# Patient Record
Sex: Female | Born: 1965 | Race: White | Hispanic: No | Smoking: Current every day smoker
Health system: Southern US, Community
[De-identification: ages and names within clinical notes are randomized; demographics above are authoritative.]

## PROBLEM LIST (undated history)

## (undated) DIAGNOSIS — K219 Gastro-esophageal reflux disease without esophagitis: Secondary | ICD-10-CM

## (undated) DIAGNOSIS — F32A Depression, unspecified: Secondary | ICD-10-CM

## (undated) DIAGNOSIS — D751 Secondary polycythemia: Secondary | ICD-10-CM

## (undated) DIAGNOSIS — E119 Type 2 diabetes mellitus without complications: Secondary | ICD-10-CM

## (undated) DIAGNOSIS — I10 Essential (primary) hypertension: Secondary | ICD-10-CM

## (undated) DIAGNOSIS — K760 Fatty (change of) liver, not elsewhere classified: Secondary | ICD-10-CM

## (undated) DIAGNOSIS — D759 Disease of blood and blood-forming organs, unspecified: Secondary | ICD-10-CM

## (undated) DIAGNOSIS — J449 Chronic obstructive pulmonary disease, unspecified: Secondary | ICD-10-CM

## (undated) DIAGNOSIS — M1990 Unspecified osteoarthritis, unspecified site: Secondary | ICD-10-CM

## (undated) DIAGNOSIS — R011 Cardiac murmur, unspecified: Secondary | ICD-10-CM

## (undated) DIAGNOSIS — F329 Major depressive disorder, single episode, unspecified: Secondary | ICD-10-CM

## (undated) DIAGNOSIS — M170 Bilateral primary osteoarthritis of knee: Secondary | ICD-10-CM

## (undated) DIAGNOSIS — K589 Irritable bowel syndrome without diarrhea: Secondary | ICD-10-CM

## (undated) HISTORY — PX: DIAGNOSTIC LAPAROSCOPY: SUR761

## (undated) HISTORY — DX: Unspecified osteoarthritis, unspecified site: M19.90

## (undated) HISTORY — DX: Bilateral primary osteoarthritis of knee: M17.0

## (undated) HISTORY — DX: Fatty (change of) liver, not elsewhere classified: K76.0

## (undated) HISTORY — PX: KNEE SURGERY: SHX244

## (undated) HISTORY — DX: Type 2 diabetes mellitus without complications: E11.9

## (undated) HISTORY — PX: ABDOMINAL HYSTERECTOMY: SHX81

---

## 2012-06-24 ENCOUNTER — Ambulatory Visit: Payer: Self-pay | Admitting: Internal Medicine

## 2016-06-10 DIAGNOSIS — E119 Type 2 diabetes mellitus without complications: Secondary | ICD-10-CM

## 2016-06-10 HISTORY — DX: Type 2 diabetes mellitus without complications: E11.9

## 2016-06-21 DIAGNOSIS — M170 Bilateral primary osteoarthritis of knee: Secondary | ICD-10-CM | POA: Insufficient documentation

## 2016-06-21 DIAGNOSIS — E1159 Type 2 diabetes mellitus with other circulatory complications: Secondary | ICD-10-CM | POA: Insufficient documentation

## 2016-06-21 DIAGNOSIS — R030 Elevated blood-pressure reading, without diagnosis of hypertension: Secondary | ICD-10-CM | POA: Insufficient documentation

## 2016-06-21 DIAGNOSIS — Z6841 Body Mass Index (BMI) 40.0 and over, adult: Secondary | ICD-10-CM | POA: Insufficient documentation

## 2016-06-24 ENCOUNTER — Other Ambulatory Visit: Payer: Self-pay | Admitting: Internal Medicine

## 2016-06-24 DIAGNOSIS — Z1239 Encounter for other screening for malignant neoplasm of breast: Secondary | ICD-10-CM

## 2016-08-06 ENCOUNTER — Ambulatory Visit: Payer: Self-pay | Attending: Internal Medicine

## 2016-12-06 DIAGNOSIS — N3001 Acute cystitis with hematuria: Secondary | ICD-10-CM | POA: Diagnosis not present

## 2016-12-06 DIAGNOSIS — R300 Dysuria: Secondary | ICD-10-CM | POA: Diagnosis not present

## 2016-12-11 ENCOUNTER — Emergency Department: Payer: BLUE CROSS/BLUE SHIELD

## 2016-12-11 ENCOUNTER — Emergency Department
Admission: EM | Admit: 2016-12-11 | Discharge: 2016-12-12 | Disposition: A | Discharge status: Home / Self Care | Payer: BLUE CROSS/BLUE SHIELD | Attending: Emergency Medicine | Admitting: Emergency Medicine

## 2016-12-11 DIAGNOSIS — E1165 Type 2 diabetes mellitus with hyperglycemia: Secondary | ICD-10-CM

## 2016-12-11 DIAGNOSIS — N938 Other specified abnormal uterine and vaginal bleeding: Secondary | ICD-10-CM | POA: Diagnosis not present

## 2016-12-11 DIAGNOSIS — N939 Abnormal uterine and vaginal bleeding, unspecified: Secondary | ICD-10-CM

## 2016-12-11 DIAGNOSIS — R739 Hyperglycemia, unspecified: Secondary | ICD-10-CM | POA: Diagnosis not present

## 2016-12-11 DIAGNOSIS — R7989 Other specified abnormal findings of blood chemistry: Secondary | ICD-10-CM

## 2016-12-11 DIAGNOSIS — R748 Abnormal levels of other serum enzymes: Secondary | ICD-10-CM | POA: Insufficient documentation

## 2016-12-11 DIAGNOSIS — Z794 Long term (current) use of insulin: Secondary | ICD-10-CM | POA: Diagnosis not present

## 2016-12-11 DIAGNOSIS — R945 Abnormal results of liver function studies: Secondary | ICD-10-CM | POA: Diagnosis not present

## 2016-12-11 DIAGNOSIS — F17200 Nicotine dependence, unspecified, uncomplicated: Secondary | ICD-10-CM | POA: Insufficient documentation

## 2016-12-11 DIAGNOSIS — K8020 Calculus of gallbladder without cholecystitis without obstruction: Secondary | ICD-10-CM | POA: Diagnosis not present

## 2016-12-11 LAB — CBC WITH DIFFERENTIAL/PLATELET
Basophils Absolute: 0.1 10*3/uL (ref 0–0.1)
Basophils Relative: 1 %
Eosinophils Absolute: 0.1 10*3/uL (ref 0–0.7)
Eosinophils Relative: 1 %
HCT: 51 % — ABNORMAL HIGH (ref 35.0–47.0)
Hemoglobin: 17.4 g/dL — ABNORMAL HIGH (ref 12.0–16.0)
Lymphocytes Relative: 36 %
Lymphs Abs: 3.5 10*3/uL (ref 1.0–3.6)
MCH: 29.9 pg (ref 26.0–34.0)
MCHC: 34.2 g/dL (ref 32.0–36.0)
MCV: 87.4 fL (ref 80.0–100.0)
Monocytes Absolute: 0.4 10*3/uL (ref 0.2–0.9)
Monocytes Relative: 4 %
Neutro Abs: 5.7 10*3/uL (ref 1.4–6.5)
Neutrophils Relative %: 58 %
Platelets: 255 10*3/uL (ref 150–440)
RBC: 5.84 MIL/uL — ABNORMAL HIGH (ref 3.80–5.20)
RDW: 13.8 % (ref 11.5–14.5)
WBC: 9.8 10*3/uL (ref 3.6–11.0)

## 2016-12-11 LAB — GLUCOSE, CAPILLARY: GLUCOSE-CAPILLARY: 320 mg/dL — AB (ref 65–99)

## 2016-12-11 LAB — COMPREHENSIVE METABOLIC PANEL
ALT: 244 U/L — ABNORMAL HIGH (ref 14–54)
AST: 283 U/L — ABNORMAL HIGH (ref 15–41)
Albumin: 3.9 g/dL (ref 3.5–5.0)
Alkaline Phosphatase: 143 U/L — ABNORMAL HIGH (ref 38–126)
Anion gap: 9 (ref 5–15)
BUN: 6 mg/dL (ref 6–20)
CO2: 26 mmol/L (ref 22–32)
Calcium: 9.2 mg/dL (ref 8.9–10.3)
Chloride: 98 mmol/L — ABNORMAL LOW (ref 101–111)
Creatinine, Ser: 0.64 mg/dL (ref 0.44–1.00)
GFR calc Af Amer: >60 mL/min (ref >60)
GFR calc non Af Amer: >60 mL/min (ref >60)
Glucose, Bld: 470 mg/dL — ABNORMAL HIGH (ref 65–99)
Potassium: 3.7 mmol/L (ref 3.5–5.1)
Sodium: 133 mmol/L — ABNORMAL LOW (ref 135–145)
Total Bilirubin: 0.8 mg/dL (ref 0.3–1.2)
Total Protein: 8 g/dL (ref 6.5–8.1)

## 2016-12-11 LAB — URINALYSIS, COMPLETE (UACMP) WITH MICROSCOPIC
BACTERIA UA: NONE SEEN
BILIRUBIN URINE: NEGATIVE
Ketones, ur: NEGATIVE mg/dL
LEUKOCYTES UA: NEGATIVE
NITRITE: NEGATIVE
PROTEIN: NEGATIVE mg/dL
SPECIFIC GRAVITY, URINE: 1.028 (ref 1.005–1.030)
WBC UA: NONE SEEN WBC/hpf (ref 0–5)
pH: 6 (ref 5.0–8.0)

## 2016-12-11 LAB — POCT PREGNANCY, URINE: Preg Test, Ur: NEGATIVE

## 2016-12-11 MED ORDER — SODIUM CHLORIDE 0.9 % IV BOLUS (SEPSIS)
1000.0000 mL | Freq: Once | INTRAVENOUS | Status: AC
  Administered 2016-12-11: 1000 mL via INTRAVENOUS

## 2016-12-11 MED ORDER — IOPAMIDOL (ISOVUE-300) INJECTION 61%
30.0000 mL | Freq: Once | INTRAVENOUS | Status: AC | PRN
  Administered 2016-12-11: 30 mL via ORAL
  Filled 2016-12-11: qty 30

## 2016-12-11 MED ORDER — METFORMIN HCL 500 MG PO TABS
500.0000 mg | ORAL_TABLET | Freq: Once | ORAL | Status: AC
  Administered 2016-12-11: 500 mg via ORAL
  Filled 2016-12-11: qty 1

## 2016-12-11 NOTE — ED Provider Notes (Signed)
[facility] Emergency Department Provider Note  ____________________________________________  Time seen: Approximately 7:24 PM  I have reviewed the triage vital signs and the nursing notes.   HISTORY  Chief Complaint Vaginal Discharge    HPI Kristen Wong is a 51 y.o. female presenting to the emergency department with vaginal bleeding, vaginal pruritus and dysuria for the past 6 days. Patient has not been menstruating for 3-4 years. Patient states that she presented to a local urgent care who diagnosed her with acute cystitis. Patient states that results from her urinalysis were not discussed with her but that her urine was sent for culture and she was treated with antibiotics. Patient is unsure of what antibiotics she was taking. Patient states that dysuria did not improve. She has been afebrile. She denies history of nephrolithiasis or pyelonephritis. No CVA tenderness. She has been afebrile. She states that she has had an approximately 20 pound weight loss and has experienced intermittent nausea and vomiting over the past three weeks. She denies night sweats or bony pain. She denies changes in vaginal discharge. Patient is not currently sexually active and hasn't been for the past 8 years. Patient states that approximately 20 years ago she engaged in the recreational use of cocaine but did not engage in IV drug use.  Patient has not been under the care of a primary care provider in 20 years. No alleviating measures have been attempted.    History reviewed. No pertinent past medical history.  There are no active problems to display for this patient.   History reviewed. No pertinent surgical history.  Prior to Admission medications   Not on File    Allergies Patient has no known allergies.  No family history on file.  Social History Social History  Substance Use Topics  . Smoking status: Current Every Day Smoker  . Smokeless tobacco: Never Used  .  Alcohol use No     Review of Systems  Constitutional: No fever/chills Eyes: No visual changes. No discharge ENT: No upper respiratory complaints. Cardiovascular: no chest pain. Respiratory: no cough. No SOB. Gastrointestinal: No abdominal pain.  No nausea, no vomiting.  No diarrhea.  No constipation. Genitourinary: Patient has dysuria, malodorous vaginal discharge and vaginal pruritis Musculoskeletal: Negative for musculoskeletal pain. Skin: Negative for rash, abrasions, lacerations, ecchymosis. Neurological: Negative for headaches, focal weakness or numbness.   ____________________________________________   PHYSICAL EXAM:  VITAL SIGNS: ED Triage Vitals [12/11/16 1827]  Enc Vitals Group     BP      Pulse      Resp      Temp      Temp src      SpO2      Weight 236 lb (107 kg)     Height 5' 4"  (1.626 m)     Head Circumference      Peak Flow      Pain Score 8     Pain Loc      Pain Edu?      Excl. in [place]?      Constitutional: Alert and oriented. Well appearing and in no acute distress. Eyes: Conjunctivae are normal. PERRL. EOMI. Head: Atraumatic. Cardiovascular: Normal rate, regular rhythm. Normal S1 and S2.  Good peripheral circulation. Respiratory: Normal respiratory effort without tachypnea or retractions. Lungs CTAB. Good air entry to the bases with no decreased or absent breath sounds. Gastrointestinal: Bowel sounds 4 quadrants. Soft and nontender to palpation. No guarding or rigidity. No palpable masses. No distention. No  CVA tenderness. Genitourinary: Vaginal atrophy visualized with excoriation and vaginal dryness. No blood was visualized in the vaginal vault. Musculoskeletal: Full range of motion to all extremities. No gross deformities appreciated. Neurologic:  Normal speech and language. No gross focal neurologic deficits are appreciated.  Skin:  Skin is warm, dry and intact. No rash noted. Psychiatric: Mood and affect are normal. Speech and behavior are  normal. Patient exhibits appropriate insight and judgement.   ____________________________________________   LABS (all labs ordered are listed, but only abnormal results are displayed)  Labs Reviewed  URINALYSIS, COMPLETE (UACMP) WITH MICROSCOPIC - Abnormal; Notable for the following:       Result Value   Color, Urine STRAW (*)    APPearance CLEAR (*)    Glucose, UA >=500 (*)    Hgb urine dipstick MODERATE (*)    Squamous Epithelial / LPF 0-5 (*)    All other components within normal limits  CBC WITH DIFFERENTIAL/PLATELET - Abnormal; Notable for the following:    RBC 5.84 (*)    Hemoglobin 17.4 (*)    HCT 51.0 (*)    All other components within normal limits  COMPREHENSIVE METABOLIC PANEL - Abnormal; Notable for the following:    Sodium 133 (*)    Chloride 98 (*)    Glucose, Bld 470 (*)    AST 283 (*)    ALT 244 (*)    Alkaline Phosphatase 143 (*)    All other components within normal limits  GLUCOSE, CAPILLARY - Abnormal; Notable for the following:    Glucose-Capillary 320 (*)    All other components within normal limits  WET PREP, GENITAL  POC URINE PREG, ED  POCT PREGNANCY, URINE  CBG MONITORING, ED   ____________________________________________  EKG   ____________________________________________  RADIOLOGY Unk Pinto, personally viewed and evaluated these images as part of my medical decision making, as well as reviewing the written report by the radiologist.    US Transvaginal Non-ob  Result Date: 12/11/2016 CLINICAL DATA:  Postmenopausal vaginal bleeding for 3 weeks EXAM: TRANSABDOMINAL AND TRANSVAGINAL ULTRASOUND OF PELVIS TECHNIQUE: Both transabdominal and transvaginal ultrasound examinations of the pelvis were performed. Transabdominal technique was performed for global imaging of the pelvis including uterus, ovaries, adnexal regions, and pelvic cul-de-sac. It was necessary to proceed with endovaginal exam following the transabdominal exam to  visualize the endometrium. COMPARISON:  None FINDINGS: Uterus Measurements: 5 x 2 x 3 cm. Sub endometrial cystic structure implying adenomyosis. Endometrium Thickness: 6 mm.  No focal abnormality visualized. Right ovary Could not be visualized. Left ovary Could not be visualized Other findings No abnormal free fluid. Visualization is limited by patient size and discomfort with transvaginal exam. The uterus is also deviated. IMPRESSION: 1. 6 mm endometrial stripe. In the setting of post-menopausal bleeding, referral for endometrial sampling is indicated to exclude carcinoma. (Ref: Radiological Reasoning: Algorithmic Workup of Abnormal Vaginal Bleeding with Endovaginal Sonography and Sonohysterography. AJR 2008; 631:S97-02) 2. Sub endometrial cyst implying adenomyosis. 3. Limited study due to patient factors. The ovaries could not be visualized. Electronically Signed   By: Monte Fantasia M.D.   On: 12/11/2016 20:41   US Pelvis Complete  Result Date: 12/11/2016 CLINICAL DATA:  Postmenopausal vaginal bleeding for 3 weeks EXAM: TRANSABDOMINAL AND TRANSVAGINAL ULTRASOUND OF PELVIS TECHNIQUE: Both transabdominal and transvaginal ultrasound examinations of the pelvis were performed. Transabdominal technique was performed for global imaging of the pelvis including uterus, ovaries, adnexal regions, and pelvic cul-de-sac. It was necessary to proceed with endovaginal exam following  the transabdominal exam to visualize the endometrium. COMPARISON:  None FINDINGS: Uterus Measurements: 5 x 2 x 3 cm. Sub endometrial cystic structure implying adenomyosis. Endometrium Thickness: 6 mm.  No focal abnormality visualized. Right ovary Could not be visualized. Left ovary Could not be visualized Other findings No abnormal free fluid. Visualization is limited by patient size and discomfort with transvaginal exam. The uterus is also deviated. IMPRESSION: 1. 6 mm endometrial stripe. In the setting of post-menopausal bleeding, referral for  endometrial sampling is indicated to exclude carcinoma. (Ref: Radiological Reasoning: Algorithmic Workup of Abnormal Vaginal Bleeding with Endovaginal Sonography and Sonohysterography. AJR 2008; 321:Y24-82) 2. Sub endometrial cyst implying adenomyosis. 3. Limited study due to patient factors. The ovaries could not be visualized. Electronically Signed   By: Monte Fantasia M.D.   On: 12/11/2016 20:41    ____________________________________________    PROCEDURES  Procedure(s) performed:    Procedures    Medications  sodium chloride 0.9 % bolus 1,000 mL (0 mLs Intravenous Stopped 12/11/16 2301)  iopamidol (ISOVUE-300) 61 % injection 30 mL (30 mLs Oral Contrast Given 12/11/16 2130)  metFORMIN (GLUCOPHAGE) tablet 500 mg (500 mg Oral Given 12/11/16 2145)     ____________________________________________   INITIAL IMPRESSION / ASSESSMENT AND PLAN / ED COURSE  Pertinent labs & imaging results that were available during my care of the patient were reviewed by me and considered in my medical decision making (see chart for details).  Review of the [place] CSRS was performed in accordance of the [place] prior to dispensing any controlled drugs.    Assessment and Plan:  Vaginal Bleeding Hyperglycemia Elevated liver enzymes Patient presented to the emergency department with vaginal bleeding, vaginal pruritus and dysuria. Pelvic ultrasound conducted in the emergency department revealed findings consistent with adenomyosis. Patient was advised to follow up with her OB/GYN to have endometrial biopsy. Patient voiced understanding regarding this recommendation. Workup conducted during this emergency department encounter revealed hyperglycemia and elevated liver enzymes. Patient's case was discussed with my supervising physician, Dr. Delman Kitten. Given recent weight loss and elevated liver enzymes, CT abdomen and pelvis were ordered in concern for malignancy. Supplemental fluids and metformin were given in the  emergency department. Patient was transferred to main side of emergency department as [facility] closed for the day.All patient questions were answered prior to transfer to main side.    ____________________________________________  FINAL CLINICAL IMPRESSION(S) / ED DIAGNOSES  Final diagnoses:  Vaginal bleeding      NEW MEDICATIONS STARTED DURING THIS VISIT:  New Prescriptions   No medications on file        This chart was dictated using voice recognition software/Dragon. Despite best efforts to proofread, errors can occur which can change the meaning. Any change was purely unintentional.    Lannie Fields, PA-C 12/11/16 2321    Delman Kitten, MD 12/12/16 [phone]

## 2016-12-11 NOTE — ED Notes (Signed)
Pt presents to ED with c/o vaginal discharge. Pt states that she was seen and given prescription for abx on Friday with no relief. Pt c/o burning, itching, odor, vaginal discharge, and intermittent bleeding x 3 weeks. Pt states has been using 7 day monistat treatment with some relief. Pt is alert and oriented at this time.

## 2016-12-11 NOTE — ED Notes (Signed)
MD and PA at bedside.

## 2016-12-11 NOTE — ED Provider Notes (Signed)
Medical screening examination/treatment/procedure(s) were conducted as a shared visit with non-physician practitioner(s) and myself.  I personally evaluated the patient during the encounter.  ----------------------------------------- 9:24 PM on 12/11/2016 -----------------------------------------    Vitals:   12/11/16 2347  BP: (!) 166/79  Pulse: 81  Resp: 18  Temp: 98.1 F (36.7 C)     Ct Abdomen Pelvis Wo Contrast  Result Date: 12/11/2016 CLINICAL DATA:  Dysarthria, weight loss and elevated LFTs EXAM: CT ABDOMEN AND PELVIS WITHOUT CONTRAST TECHNIQUE: Multidetector CT imaging of the abdomen and pelvis was performed following the standard protocol without IV contrast. COMPARISON:  Pelvic ultrasound 12/11/2016 FINDINGS: Lower chest: No pulmonary nodules. No visible pleural or pericardial effusion. Hepatobiliary: Attenuation of the liver measuring less than 40 HU is consistent with hepatic steatosis. There is focal fatty sparing of the gallbladder fossa. There is a large stone within the gallbladder. No evidence of inflammation. Pancreas: Normal pancreatic contours. No peripancreatic fluid collection or pancreatic ductal dilatation. Spleen: Normal. Adrenals/Urinary Tract: Normal adrenal glands. No hydronephrosis or nephrolithiasis. No abnormal perinephric stranding. No ureteral obstruction. Stomach/Bowel: There is no hiatal hernia. The stomach and duodenum are normal. There is no dilated small bowel or enteric inflammation. There is no colonic abnormality. The appendix is normal. Vascular/Lymphatic: There is atherosclerotic calcification of the non aneurysmal abdominal aorta. No abdominal or pelvic adenopathy. Reproductive: There is fluid in the endometrial cavity, better characterized on concomitant pelvic ultrasound. Musculoskeletal: No lytic or blastic osseous lesion. Normal visualized extrathoracic and extraperitoneal soft tissues. Other: No contributory non-categorized findings. IMPRESSION: 1.  No acute abnormality of the abdomen or pelvis. 2. Hepatic steatosis and cholelithiasis without acute inflammation. 3.  Aortic Atherosclerosis (ICD10-I70.0). Electronically Signed   By: Ulyses Jarred M.D.   On: 12/11/2016 23:49   US Transvaginal Non-ob  Result Date: 12/11/2016 CLINICAL DATA:  Postmenopausal vaginal bleeding for 3 weeks EXAM: TRANSABDOMINAL AND TRANSVAGINAL ULTRASOUND OF PELVIS TECHNIQUE: Both transabdominal and transvaginal ultrasound examinations of the pelvis were performed. Transabdominal technique was performed for global imaging of the pelvis including uterus, ovaries, adnexal regions, and pelvic cul-de-sac. It was necessary to proceed with endovaginal exam following the transabdominal exam to visualize the endometrium. COMPARISON:  None FINDINGS: Uterus Measurements: 5 x 2 x 3 cm. Sub endometrial cystic structure implying adenomyosis. Endometrium Thickness: 6 mm.  No focal abnormality visualized. Right ovary Could not be visualized. Left ovary Could not be visualized Other findings No abnormal free fluid. Visualization is limited by patient size and discomfort with transvaginal exam. The uterus is also deviated. IMPRESSION: 1. 6 mm endometrial stripe. In the setting of post-menopausal bleeding, referral for endometrial sampling is indicated to exclude carcinoma. (Ref: Radiological Reasoning: Algorithmic Workup of Abnormal Vaginal Bleeding with Endovaginal Sonography and Sonohysterography. AJR 2008; 833:A25-05) 2. Sub endometrial cyst implying adenomyosis. 3. Limited study due to patient factors. The ovaries could not be visualized. Electronically Signed   By: Monte Fantasia M.D.   On: 12/11/2016 20:41   US Pelvis Complete  Result Date: 12/11/2016 CLINICAL DATA:  Postmenopausal vaginal bleeding for 3 weeks EXAM: TRANSABDOMINAL AND TRANSVAGINAL ULTRASOUND OF PELVIS TECHNIQUE: Both transabdominal and transvaginal ultrasound examinations of the pelvis were performed. Transabdominal technique  was performed for global imaging of the pelvis including uterus, ovaries, adnexal regions, and pelvic cul-de-sac. It was necessary to proceed with endovaginal exam following the transabdominal exam to visualize the endometrium. COMPARISON:  None FINDINGS: Uterus Measurements: 5 x 2 x 3 cm. Sub endometrial cystic structure implying adenomyosis. Endometrium Thickness: 6 mm.  No  focal abnormality visualized. Right ovary Could not be visualized. Left ovary Could not be visualized Other findings No abnormal free fluid. Visualization is limited by patient size and discomfort with transvaginal exam. The uterus is also deviated. IMPRESSION: 1. 6 mm endometrial stripe. In the setting of post-menopausal bleeding, referral for endometrial sampling is indicated to exclude carcinoma. (Ref: Radiological Reasoning: Algorithmic Workup of Abnormal Vaginal Bleeding with Endovaginal Sonography and Sonohysterography. AJR 2008; 550:I16-42) 2. Sub endometrial cyst implying adenomyosis. 3. Limited study due to patient factors. The ovaries could not be visualized. Electronically Signed   By: Monte Fantasia M.D.   On: 12/11/2016 20:41   Carefully discussed with the patient her need for close follow-up and careful return precautions. I will start her on metformin as I suspect she has type 2 diabetes, likely hyperglycemia has been giving her some feeling of dysuria. She will be following up with a primary doctor, following up with gastroenterology, and also with gynecology.  Return precautions and treatment recommendations and follow-up discussed with the patient who is agreeable with the plan.    Delman Kitten, MD 12/12/16 [phone]

## 2016-12-11 NOTE — ED Triage Notes (Signed)
Pt c/o vaginal discharge with pain and itching for the past 3 weeks, states [facility] was seen [facility] and put on abx for uti. States she is not getting any better.

## 2016-12-12 MED ORDER — METFORMIN HCL 500 MG PO TABS: 500.0000 mg | ORAL_TABLET | Freq: Two times a day (BID) | ORAL | 0 refills | Status: DC

## 2016-12-12 NOTE — Discharge Instructions (Signed)
It is very important that he set up close follow-up with her doctor. Please start the medication we have given you for diabetes.  Follow-up with a primary care physician as soon as possible as well as a gynecologist. Need further evaluation with both and this is VERY important.  It is important that you follow up closely with your primary care doctor in the next couple of days.  You have a follow-up as well with gastroenterology for further tests as your liver tests and enzymes were abnormal. Please setup this appointment as soon as possible.  Please return to the emergency room right away if you are to develop a fever, yellowing of the skin or eyes, severe nausea, your pain becomes severe or worsens, you are unable to keep food down, begin vomiting any dark or bloody fluid, you develop any dark or bloody stools, feel dehydrated, or other new concerns or symptoms arise.

## 2016-12-12 NOTE — ED Notes (Signed)
NAD noted at time of D/C. Pt denies questions or concerns. Pt ambulatory to the lobby at this time.  

## 2016-12-12 NOTE — ED Notes (Signed)
This RN to bedside at this time. Pt states that she is ready to go home. Dr. Jacqualine Code notified, states he is going to talk to patient and that she is able to be D/C.

## 2016-12-17 DIAGNOSIS — N61 Mastitis without abscess: Secondary | ICD-10-CM | POA: Diagnosis not present

## 2016-12-17 DIAGNOSIS — D751 Secondary polycythemia: Secondary | ICD-10-CM | POA: Diagnosis not present

## 2016-12-17 DIAGNOSIS — E119 Type 2 diabetes mellitus without complications: Secondary | ICD-10-CM | POA: Diagnosis not present

## 2016-12-17 DIAGNOSIS — R748 Abnormal levels of other serum enzymes: Secondary | ICD-10-CM | POA: Diagnosis not present

## 2016-12-20 DIAGNOSIS — E118 Type 2 diabetes mellitus with unspecified complications: Secondary | ICD-10-CM | POA: Insufficient documentation

## 2016-12-20 DIAGNOSIS — R809 Proteinuria, unspecified: Secondary | ICD-10-CM | POA: Insufficient documentation

## 2016-12-20 DIAGNOSIS — E119 Type 2 diabetes mellitus without complications: Secondary | ICD-10-CM | POA: Insufficient documentation

## 2016-12-22 DIAGNOSIS — D751 Secondary polycythemia: Secondary | ICD-10-CM | POA: Insufficient documentation

## 2016-12-22 DIAGNOSIS — N61 Mastitis without abscess: Secondary | ICD-10-CM | POA: Insufficient documentation

## 2016-12-24 ENCOUNTER — Encounter: Payer: Self-pay | Admitting: Obstetrics and Gynecology

## 2016-12-25 ENCOUNTER — Ambulatory Visit (INDEPENDENT_AMBULATORY_CARE_PROVIDER_SITE_OTHER): Payer: BLUE CROSS/BLUE SHIELD | Admitting: Obstetrics and Gynecology

## 2016-12-25 ENCOUNTER — Encounter: Payer: Self-pay | Admitting: Obstetrics and Gynecology

## 2016-12-25 VITALS — BP 142/78 | HR 98 | Ht 64.0 in | Wt 229.0 lb

## 2016-12-25 DIAGNOSIS — N950 Postmenopausal bleeding: Secondary | ICD-10-CM

## 2016-12-25 DIAGNOSIS — Z124 Encounter for screening for malignant neoplasm of cervix: Secondary | ICD-10-CM

## 2016-12-25 DIAGNOSIS — B379 Candidiasis, unspecified: Secondary | ICD-10-CM | POA: Diagnosis not present

## 2016-12-25 DIAGNOSIS — C541 Malignant neoplasm of endometrium: Secondary | ICD-10-CM | POA: Diagnosis not present

## 2016-12-25 DIAGNOSIS — R935 Abnormal findings on diagnostic imaging of other abdominal regions, including retroperitoneum: Secondary | ICD-10-CM

## 2016-12-25 MED ORDER — NYSTATIN 100000 UNIT/GM EX CREA: 1.0000 "application " | TOPICAL_CREAM | Freq: Two times a day (BID) | CUTANEOUS | 1 refills | Status: DC

## 2016-12-25 MED ORDER — TERCONAZOLE 0.8 % VA CREA: 1.0000 | TOPICAL_CREAM | Freq: Every day | VAGINAL | 1 refills | Status: DC

## 2016-12-25 NOTE — Patient Instructions (Signed)
Postmenopausal Bleeding Postmenopausal bleeding is any bleeding after menopause. Menopause is when a woman's period stops. Any type of bleeding after menopause is concerning. It should be checked by your doctor. Any treatment will depend on the cause. Follow these instructions at home: Watch your condition for any changes.  Avoid the use of tampons and douches as told by your doctor.  Change your pads often.  Get regular pelvic exams and Pap tests.  Keep all appointments for tests as told by your doctor.  Contact a doctor if:  Your bleeding lasts for more than 1 week.  You have belly (abdominal) pain.  You have bleeding after sex (intercourse). Get help right away if:  You have a fever, chills, a headache, dizziness, muscle aches, and bleeding.  You have strong pain with bleeding.  You have clumps of blood (blood clots) coming from your vagina.  You have bleeding and need more than 1 pad an hour.  You feel like you are going to pass out (faint). This information is not intended to replace advice given to you by your health care provider. Make sure you discuss any questions you have with your health care provider. Document Released: 03/05/2008 Document Revised: 11/02/2015 Document Reviewed: 12/24/2012 Elsevier Interactive Patient Education  2017 [employer]  

## 2016-12-25 NOTE — Progress Notes (Signed)
Gynecology H&P  Chief Complaint:  Chief Complaint  Patient presents with  . ER follow up    vaginal dryness/itching/burning    History of Present Illness: Patient is a 51 y.o. G2P1011 presents evaluation of postmenopausal bleeding. The patient states she has had 1 episodes of bleeding in the past 2 week(s).  The most recent episode occurred  2  week(s) ago.  The bleeding has been intermittent but consisted of multiple episodes and has not contained clots . She describes the blood as Bright red in appearance.  She has had cramping. She deniestrauma or other inciting event, early satiety and abdominal pain. She does not have a history of abnormal pap smears.  Her last pap smear was>20 years ago years ago and was read as no abnormalities .  The patient is currently sexually active with no postcoital bleeding noted There are no other aggravating factors reported. There are no alleviating factors reported. The patient's past medical history is noncontributory, she has not been under regular medical care for sometime.   She hashad prior work up for postmenopausal bleeding.  ER ultrasound 12/11/16 normal, endometrial stripe thickened at 31m  Review of Systems: 10 point review of systems negative unless otherwise noted in HPI  Past Medical History:  Past Medical History:  Diagnosis Date  . Diabetes mellitus without complication ([place] [id number]  Type 2    Past Surgical History:  Past Surgical History:  Procedure Laterality Date  . CESAREAN SECTION    . KNEE SURGERY     [facility]    Family History:  History reviewed. No pertinent family history.  Social History:  Social History   Social History  . Marital status: Divorced    Spouse name: N/A  . Number of children: N/A  . Years of education: N/A   Occupational History  . Not on file.   Social History Main Topics  . Smoking status: Current Every Day Smoker  . Smokeless tobacco: Never Used  . Alcohol use No  . Drug use: No  .  Sexual activity: Not Currently   Other Topics Concern  . Not on file   Social History Narrative  . No narrative on file    Allergies:  Allergies  Allergen Reactions  . Penicillin G Nausea Only    Medications: Prior to Admission medications   Medication Sig Start Date End Date Taking? Authorizing Provider  Blood Glucose Monitoring Suppl (FIFTY50 GLUCOSE METER 2.0) w/Device KIT Use as directed. 12/17/16 12/17/17 Yes [provider]  doxycycline (MONODOX) 100 MG capsule Take by mouth. 12/17/16 03/17/17 Yes [provider]  fluticasone (FLONASE) 50 MCG/ACT nasal spray Place into the nose. 04/12/16  Yes [provider]  glimepiride (AMARYL) 2 MG tablet Take by mouth. 12/22/16 12/22/17 Yes [provider]  Insulin Pen Needle (FIFTY50 PEN NEEDLES) 32G X 6 MM MISC Use as directed. 12/20/16 12/20/17 Yes [provider]  metFORMIN (GLUCOPHAGE) 500 MG tablet Take 1 tablet (500 mg total) by mouth 2 (two) times daily with a meal. 12/12/16  Yes QDelman Kitten MD    Physical Exam Vitals: Blood pressure (!) 142/78, pulse 98, height 5' 4"  (1.626 m), weight 229 lb (103.9 kg).  General: NAD HEENT: normocephalic, anicteric Pulmonary: No increased work of breathing Abdomen: NABS, soft, non-tender, non-distended.  Umbilicus without lesions.  No hepatomegaly, splenomegaly or masses palpable. No evidence of hernia  Genitourinary:  External: Normal external female genitalia.  Normal urethral meatus, normal Bartholin's and Skene's  glands.  Some erythema external labia consistent with candida  Vagina: Normal vaginal mucosa, no evidence of prolapse.    Cervix: Grossly normal in appearance, no bleeding  Uterus: Non-enlarged, mobile, normal contour.  No CMT  Adnexa: ovaries non-enlarged, no adnexal masses  Rectal: deferred Extremities: no edema, erythema, or tenderness Neurologic: Grossly intact Psychiatric: mood appropriate, affect full  ENDOMETRIAL BIOPSY     The  indications for endometrial biopsy were reviewed.   Risks of the biopsy including cramping, bleeding, infection, uterine perforation, inadequate specimen and need for additional procedures  were discussed. The patient states she understands and agrees to undergo procedure today. Consent was signed. Time out was performed. Urine HCG was negative. A Graves speculum was placed and the cervix was brought into view.  The cervix was prepped with Betadine. A single-toothed tenaculum was  placed on the anterior lip of the cervix for traction. A 3 mm pipelle was introduced through the cervix into the endometrial cavity without difficulty to a depth of 7cm, and a moderate amount of tissue was obtained, the resulting specime sent to pathology. The instruments were removed from the patient's vagina. Minimal bleeding from the cervix was noted. The patient tolerated the procedure well. Routine post-procedure instructions were given to the patient.  She will be contacted by phone one results become available.     Assessment: 51 y.o. G2P1011 ER follow up postmenopausal bleeding  Plan: Problem List Items Addressed This Visit    None    Visit Diagnoses    Cervical cancer screening    -  Primary   Relevant Orders   PapIG, HPV, rfx 16/18   Postmenopausal bleeding       Relevant Orders   PapIG, HPV, rfx 16/18   Pathology Report   Abnormal ultrasound of endometrium       Relevant Orders   Pathology Report   Candida infection       Relevant Medications   nystatin cream (MYCOSTATIN)      1) We discussed that menopause is a clinical diagnosis made after 12 months of amenorrhea.  The average age of menopause in the  [place] population is 69 but there may be significant variation.  Any bleeding that happens after a 12 month period of amenorrhea warrants further work.  Possible etiologies of postmenopausal bleeding were discussed with the patient today.  These may range from benign etiologies such as urethral  prolapse and atrophy, to indeterminate lesions such as submucosal fibroids or polyps which would require resection to accurately evaluate. The role of unopposed estrogen in the development of  dndometrial hyperplasia or carcinoma is discussed.  The risk of endometrial hyperplasia is linearly correlated with increasing BMI given the production of estrone by adipose tissue.  Work up will be include transvaginal ultrasound to assess the thickness of the endometrial lining as well as to assess for focal uterine lesions.  Negative ultrasound evaluation, defined as the absence of focal lesions and endometrial stripe of <10m, effectively rules out carcinoma and confirms atrophy as the most likely etiology.  Should focal lesions be present these generally require hysteroscopic resection.  Should lining be greater >439mendometrial biopsy is warranted to rule out hyperplasia or frank endometrial cancer.  Continued episodes of bleeding despite negative ultrasound also warrant endometrial sampling.  As the cervical pathology may also be implicated in postmenopausal bleeding prior cervical cytology was reviewed and repeated if required per ASCCP guidelines.   2) Candida - rx nystatin and terazole.  Discussed role  of blood sugar control in pathogenesis or recurrent yeast infections  3) Evaluation by pelvic ultrasound done in ED, EMB discussed performed today. Pros and cons of these modalities of testing discussed.   4) A total of 20 minutes were spent in face-to-face contact with the patient during this encounter with over half of that time devoted to counseling and coordination of care.

## 2016-12-27 LAB — PATHOLOGY

## 2016-12-31 ENCOUNTER — Encounter: Payer: Self-pay | Admitting: *Deleted

## 2016-12-31 ENCOUNTER — Encounter: Payer: BLUE CROSS/BLUE SHIELD | Attending: Internal Medicine | Admitting: *Deleted

## 2016-12-31 VITALS — BP 142/84 | Ht 64.0 in | Wt 226.5 lb

## 2016-12-31 DIAGNOSIS — Z713 Dietary counseling and surveillance: Secondary | ICD-10-CM | POA: Diagnosis not present

## 2016-12-31 DIAGNOSIS — E119 Type 2 diabetes mellitus without complications: Secondary | ICD-10-CM | POA: Diagnosis not present

## 2016-12-31 LAB — PAPIG, HPV, RFX 16/18
HPV, HIGH-RISK: NEGATIVE
PAP Smear Comment: 0

## 2016-12-31 NOTE — Progress Notes (Signed)
Diabetes Self-Management Education  Visit Type: First/Initial  Appt. Start Time: 1510 Appt. End Time: 1620  12/31/2016  Kristen Wong, identified by name and date of birth, is a 51 y.o. female with a diagnosis of Diabetes: Type 2.   ASSESSMENT  Blood pressure (!) 142/84, height 5' 4"  (1.626 m), weight 226 lb 8 oz (102.7 kg). Body mass index is 38.88 kg/m.      Diabetes Self-Management Education - 12/31/16 1643      Visit Information   Visit Type First/Initial     Initial Visit   Diabetes Type Type 2   Are you currently following a meal plan? Yes   What type of meal plan do you follow? "less junk food"   Are you taking your medications as prescribed? Yes   Date Diagnosed 2 weeks ago     Health Coping   How would you rate your overall health? Poor     Psychosocial Assessment   Patient Belief/Attitude about Diabetes Other (comment)  "not very well"   Self-care barriers None   Self-management support Doctor's office;Family   Patient Concerns Nutrition/Meal planning;Glycemic Control;Medication;Monitoring;Weight Control;Healthy Lifestyle   Special Needs None   Preferred Learning Style Auditory;Visual;Hands on   [place] in progress   How often do you need to have someone help you when you read instructions, pamphlets, or other written materials from your doctor or pharmacy? 1 - Never   What is the last grade level you completed in school? 12th     Pre-Education Assessment   Patient understands the diabetes disease and treatment process. Needs Instruction   Patient understands incorporating nutritional management into lifestyle. Needs Instruction   Patient undertands incorporating physical activity into lifestyle. Needs Instruction   Patient understands using medications safely. Needs Instruction   Patient understands monitoring blood glucose, interpreting and using results Needs Review   Patient understands prevention, detection, and treatment of acute  complications. Needs Instruction   Patient understands prevention, detection, and treatment of chronic complications. Needs Instruction   Patient understands how to develop strategies to address psychosocial issues. Needs Instruction   Patient understands how to develop strategies to promote health/change behavior. Needs Instruction     Complications   Last HgB A1C per patient/outside source 12.7 %  12/17/16   How often do you check your blood sugar? 1-2 times/day   Fasting Blood glucose range (mg/dL) 70-129;130-179  FBG's 116, 148, 156 mg/dL   Postprandial Blood glucose range (mg/dL) 130-179;180-200;>200  pp's 180, 130, 225 mg/dL.    Number of hypoglycemic episodes per month 1  She reported symptoms but didn't have meter.    Can you tell when your blood sugar is low? Yes   What do you do if your blood sugar is low? ate some crackers   Have you had a dilated eye exam in the past 12 months? No   Have you had a dental exam in the past 12 months? No   Are you checking your feet? No     Dietary Intake   Breakfast sausage and egg sandwich; oatmeal   Lunch Atkins frozen dinner and yogurt or fruit   Dinner baked chicken or pork chops or hamburger with corn, potatoes, green beans, pinto beans, broccoli   Beverage(s) water, coffee or tea sweetened with Splenda     Exercise   Exercise Type ADL's     Patient Education   Previous Diabetes Education No   Disease state  Definition of diabetes, type 1 and 2,  and the diagnosis of diabetes   Nutrition management  Role of diet in the treatment of diabetes and the relationship between the three main macronutrients and blood glucose level;Reviewed blood glucose goals for pre and post meals and how to evaluate the patients' food intake on their blood glucose level.   Physical activity and exercise  Role of exercise on diabetes management, blood pressure control and cardiac health.   Medications Taught/reviewed insulin injection, site rotation, insulin  storage and needle disposal.;Reviewed patients medication for diabetes, action, purpose, timing of dose and side effects.   Monitoring Purpose and frequency of SMBG.;Taught/discussed recording of test results and interpretation of SMBG.;Yearly dilated eye exam   Acute complications Taught treatment of hypoglycemia - the 15 rule.   Chronic complications Relationship between chronic complications and blood glucose control   Psychosocial adjustment Identified and addressed patients feelings and concerns about diabetes     Individualized Goals (developed by patient)   Reducing Risk Improve blood sugars Decrease medications Prevent diabetes complications Lose weight Lead a healthier lifestyle     Outcomes   Expected Outcomes Demonstrated interest in learning. Expect positive outcomes      Individualized Plan for Diabetes Self-Management Training:   Learning Objective:  Patient will have a greater understanding of diabetes self-management. Patient education plan is to attend individual and/or group sessions per assessed needs and concerns.   Plan:   Patient Instructions  Check blood sugars 2 x day before breakfast and 2 hrs after supper every day Bring blood sugar records to the next class Exercise: Begin walking for  10-15  minutes 3 days a week and gradually increase to 30 minutes 5 x week Eat 3 meals day, 1-2 snacks a day Space meals 4-6 hours apart Avoid sugar sweetened drinks (juices) unless treating a low blood sugar Quit  smoking Make an eye doctor appointment Carry fast acting glucose and a snack at all times Rotate injection sites Hold insulin pen in place 5-10 seconds after injection   Expected Outcomes:  Demonstrated interest in learning. Expect positive outcomes  Education material provided:  General Meal Planning Guidelines Simple Meal Plan Glucose tablets Symptoms, causes and treatments of Hypoglycemia Getting Started - Insulin (BD) Lancet device  If problems  or questions, patient to contact team via:  Johny Drilling, [place], [place], CDE [phone]  Future DSME appointment:  Patient to check her work calendar and call back to schedule classes.

## 2016-12-31 NOTE — Patient Instructions (Signed)
Check blood sugars 2 x day before breakfast and 2 hrs after supper every day Bring blood sugar records to the next class  Exercise: Begin walking for  10-15  minutes 3 days a week and gradually increase to 30 minutes 5 x week  Eat 3 meals day, 1-2 snacks a day Space meals 4-6 hours apart Avoid sugar sweetened drinks (juices) unless treating a low blood sugar  Quit  smoking  Make an eye doctor appointment  Carry fast acting glucose and a snack at all times Rotate injection sites Hold insulin pen in place 5-10 seconds after injection  Return for classes on:

## 2017-01-03 ENCOUNTER — Telehealth: Payer: Self-pay | Admitting: Obstetrics and Gynecology

## 2017-01-03 NOTE — Telephone Encounter (Signed)
Hyperplasia with atypia, attempted to contact no answer.  Voicemail left to call back to discuss next steps

## 2017-01-06 ENCOUNTER — Telehealth: Payer: Self-pay | Admitting: Oncology

## 2017-01-06 ENCOUNTER — Inpatient Hospital Stay: Payer: BLUE CROSS/BLUE SHIELD | Admitting: Oncology

## 2017-01-06 ENCOUNTER — Inpatient Hospital Stay: Payer: BLUE CROSS/BLUE SHIELD

## 2017-01-06 NOTE — Telephone Encounter (Signed)
Please advise 

## 2017-01-06 NOTE — Telephone Encounter (Signed)
Left message on patient's v/m to reschedule missed appointment.

## 2017-01-06 NOTE — Telephone Encounter (Signed)
Pt is calling to for result. Please advise [phone]

## 2017-01-06 NOTE — Telephone Encounter (Signed)
Pt is calling do to missed call. Pt would like a call back . Work Number [phone]

## 2017-01-07 ENCOUNTER — Other Ambulatory Visit: Payer: Self-pay | Admitting: Obstetrics and Gynecology

## 2017-01-07 DIAGNOSIS — N8502 Endometrial intraepithelial neoplasia [EIN]: Secondary | ICD-10-CM

## 2017-01-07 NOTE — Progress Notes (Signed)
Discussed findings of complex hyperplasia with atypia, risk for underlying endometrial cancer and rational for proceeding with hysterectomy.  Will have patient meet with GYN-[facility] and then co-ordinate surgery

## 2017-01-09 DIAGNOSIS — R7989 Other specified abnormal findings of blood chemistry: Secondary | ICD-10-CM | POA: Diagnosis not present

## 2017-01-09 DIAGNOSIS — Z1211 Encounter for screening for malignant neoplasm of colon: Secondary | ICD-10-CM | POA: Diagnosis not present

## 2017-01-15 ENCOUNTER — Inpatient Hospital Stay: Payer: BLUE CROSS/BLUE SHIELD | Admitting: Oncology

## 2017-01-15 ENCOUNTER — Other Ambulatory Visit: Payer: Self-pay

## 2017-01-15 ENCOUNTER — Inpatient Hospital Stay: Payer: BLUE CROSS/BLUE SHIELD

## 2017-01-15 ENCOUNTER — Encounter: Payer: Self-pay | Admitting: Oncology

## 2017-01-15 ENCOUNTER — Inpatient Hospital Stay: Payer: BLUE CROSS/BLUE SHIELD | Attending: Obstetrics and Gynecology | Admitting: Obstetrics and Gynecology

## 2017-01-15 VITALS — BP 134/70 | HR 86 | Temp 97.1°F | Resp 18 | Ht 64.0 in | Wt 223.1 lb

## 2017-01-15 VITALS — BP 134/70 | HR 86 | Temp 97.1°F | Ht 64.0 in | Wt 223.0 lb

## 2017-01-15 DIAGNOSIS — D751 Secondary polycythemia: Secondary | ICD-10-CM | POA: Diagnosis not present

## 2017-01-15 DIAGNOSIS — Z79899 Other long term (current) drug therapy: Secondary | ICD-10-CM | POA: Diagnosis not present

## 2017-01-15 DIAGNOSIS — T50905A Adverse effect of unspecified drugs, medicaments and biological substances, initial encounter: Secondary | ICD-10-CM

## 2017-01-15 DIAGNOSIS — D582 Other hemoglobinopathies: Secondary | ICD-10-CM | POA: Insufficient documentation

## 2017-01-15 DIAGNOSIS — E669 Obesity, unspecified: Secondary | ICD-10-CM | POA: Diagnosis not present

## 2017-01-15 DIAGNOSIS — Z794 Long term (current) use of insulin: Secondary | ICD-10-CM | POA: Diagnosis not present

## 2017-01-15 DIAGNOSIS — Z129 Encounter for screening for malignant neoplasm, site unspecified: Secondary | ICD-10-CM

## 2017-01-15 DIAGNOSIS — R634 Abnormal weight loss: Secondary | ICD-10-CM | POA: Insufficient documentation

## 2017-01-15 DIAGNOSIS — K8020 Calculus of gallbladder without cholecystitis without obstruction: Secondary | ICD-10-CM | POA: Diagnosis not present

## 2017-01-15 DIAGNOSIS — N8501 Benign endometrial hyperplasia: Secondary | ICD-10-CM | POA: Insufficient documentation

## 2017-01-15 DIAGNOSIS — Z6838 Body mass index (BMI) 38.0-38.9, adult: Secondary | ICD-10-CM | POA: Diagnosis not present

## 2017-01-15 DIAGNOSIS — K760 Fatty (change of) liver, not elsewhere classified: Secondary | ICD-10-CM | POA: Insufficient documentation

## 2017-01-15 DIAGNOSIS — Z7982 Long term (current) use of aspirin: Secondary | ICD-10-CM | POA: Diagnosis not present

## 2017-01-15 DIAGNOSIS — E8881 Metabolic syndrome: Secondary | ICD-10-CM | POA: Insufficient documentation

## 2017-01-15 DIAGNOSIS — E119 Type 2 diabetes mellitus without complications: Secondary | ICD-10-CM | POA: Insufficient documentation

## 2017-01-15 DIAGNOSIS — I700 Atherosclerosis of aorta: Secondary | ICD-10-CM | POA: Diagnosis not present

## 2017-01-15 DIAGNOSIS — Z716 Tobacco abuse counseling: Secondary | ICD-10-CM | POA: Insufficient documentation

## 2017-01-15 DIAGNOSIS — IMO0001 Reserved for inherently not codable concepts without codable children: Secondary | ICD-10-CM

## 2017-01-15 DIAGNOSIS — F17210 Nicotine dependence, cigarettes, uncomplicated: Secondary | ICD-10-CM | POA: Diagnosis not present

## 2017-01-15 LAB — CBC WITH DIFFERENTIAL/PLATELET
Basophils Absolute: 0.1 10*3/uL (ref 0–0.1)
Basophils Relative: 1 %
Eosinophils Absolute: 0.1 10*3/uL (ref 0–0.7)
Eosinophils Relative: 1 %
HEMATOCRIT: 47.4 % — AB (ref 35.0–47.0)
HEMOGLOBIN: 16.6 g/dL — AB (ref 12.0–16.0)
LYMPHS ABS: 3.8 10*3/uL — AB (ref 1.0–3.6)
LYMPHS PCT: 37 %
MCH: 30.3 pg (ref 26.0–34.0)
MCHC: 34.9 g/dL (ref 32.0–36.0)
MCV: 86.8 fL (ref 80.0–100.0)
MONOS PCT: 6 %
Monocytes Absolute: 0.6 10*3/uL (ref 0.2–0.9)
NEUTROS ABS: 5.8 10*3/uL (ref 1.4–6.5)
NEUTROS PCT: 55 %
Platelets: 329 10*3/uL (ref 150–440)
RBC: 5.46 MIL/uL — ABNORMAL HIGH (ref 3.80–5.20)
RDW: 14.1 % (ref 11.5–14.5)
WBC: 10.4 10*3/uL (ref 3.6–11.0)

## 2017-01-15 LAB — PROTIME-INR
INR: 1.01
Prothrombin Time: 13.3 seconds (ref 11.4–15.2)

## 2017-01-15 LAB — PATHOLOGIST SMEAR REVIEW

## 2017-01-15 NOTE — H&P (Signed)
Gynecologic Oncology Consult Visit   Referring Provider: Dr. Malachy Mood  Chief Concern: complex atypical hyperplasia  Subjective:  Kristen Wong is a 51 y.o. G1P1 female who is seen in consultation from Dr. 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 She presented to Dr. Georgianne Fick for evaluation of perimenopausal bleeding. She has had hot flashes for years and abnormal bleeding. She has not had a normal period since her 52's. Evaluation included the following:  Pelvic ultrasound 12/11/16 norma except for endometrial stripe thickened at 87m. Uterus Measurements: 5 x 2 x 3 cm. Sub endometrial cystic structure implying adenomyosis. Bilateral ovaries could not be visualized. No abnormal free fluid.  CT scan A/P: negative for adenopathy or metastatic disease. She hepatic steatosis and cholelithiasis without acute inflammation and aortic atherosclerosis based on calcifications.   Consult with Dr. SGeorgianne Fick7/18/2018. He noted she does not have a history of abnormal pap smears.  Her prior last pap smear was>20 years ago years ago and was read as no abnormalities.  Pap  which was NILM. Endometrial biopsy uterus sounded to 7 cm and pathology "COMPLEX HYPERPLASIA WITH ATYPIA CAN NOT EXCLUDE WELL DIFFERENTIATED ENDOMETRIAL ADENOCARCINOMA."   She presents today for evaluation. She also has an appointment with Dr. YTasia Catchingsfor evaluation of polycythemia with a Hb 17.4 and HCT 51.0. She also has elevated LFTs in the 200's and saw Dr. ETiffany Kocheron 01/09/2017. She had a complete evaluation including the following:  - Comprehensive Metabolic Panel (CMP) - repeat AST 63 and ALT 93.  - Prothrombin Time (INR) - Iron Panel - Ferritin - HBV/HCV (Profile VIII) - [facility] - Hep A Ab, Total - [facility] - AFP, Serum, Tumor Marker - [facility] - Alpha-1-Antitrypsin Phenotyp - [facility] - Mitochondrial (M2) Antibody - [facility] - Antinuclear Antibodies, IFA - [facility] - Actin (Smooth Muscle) Antibody -  [facility] - Ceruloplasmin - [facility]  Workup negative except for positive Hep A total Ab positive. The overall findings were consistent with an acute event (i.e. Viral infection, medication/supplement induced, etc) that created the acutely elevated LFTs. She likely has a degree of fatty liver per CT findings, and lifestyle modifications were recommended as well as Hepatitis B vaccination as she is not immune.         Problem List: Patient Active Problem List   Diagnosis Date Noted  . Endometrial hyperplasia without atypia, complex 01/15/2017  . Ele[place]ated hemoglobin [place] 01/15/2017    Past Medical History: Past Medical History:  Diagnosis Date  . Arthritis   . [place] complication ([place] [id number]  Type 2    Past Surgical History: Past Surgical History:  Procedure Laterality Date  . CESAREAN SECTION    . KNEE SURGERY     [facility]    Past Gynecologic History:  Menarche: 10 Menstrual details: long history of abnormal vaginal bleeding and cycles.  Menses regular: n/a Last Menstrual Period: unknown History of Abnormal pap: no, benign cellular changes Last pap: see HPI    OB History:  OB History  Gravida Para Term Preterm AB Living  2 1 1   1 1   SAB TAB Ectopic Multiple Live Births          1    # Outcome Date GA Lbr Len/2nd Weight Sex Delivery Anes PTL Lv  2 Term 01/04/89    F CS-LTranv  N LIV  1 AB               Family History: Family History  Problem Relation  Age of Onset  . Diabetes Mother   . Diabetes Father   . Diabetes Sister     Social History: Social History   Social History  . Marital status: Divorced    Spouse name: N/A  . Number of children: N/A  . Years of education: N/A   Occupational History  . Not on file.   Social History Main Topics  . Smoking status: Current Every Day Smoker    Packs/day: 1.00    Years: 30.00  . Smokeless tobacco: Never Used  . Alcohol use No  . Drug use: No  . Sexual activity: Not Currently    Other Topics Concern  . Not on file   Social History Narrative  . No narrative on file    Allergies: Allergies  Allergen Reactions  . Penicillin G Nausea Only    Current Medications: Current Outpatient Prescriptions  Medication Sig Dispense Refill  . Blood Glucose Monitoring Suppl (FIFTY50 GLUCOSE METER 2.0) w/Device KIT Use as directed.    . insulin glargine (LANTUS) 100 unit/mL SOPN Inject 20 Units into the skin at bedtime.    . Insulin Pen Needle (FIFTY50 PEN NEEDLES) 32G X 6 MM MISC Use as directed.    . metFORMIN (GLUCOPHAGE) 500 MG tablet Take 1 tablet (500 mg total) by mouth 2 (two) times daily with a meal. 60 tablet 0  . nystatin cream (MYCOSTATIN) Apply 1 application topically 2 (two) times daily. 30 g 1  . terconazole (TERAZOL 3) 0.8 % vaginal cream Place 1 applicator vaginally at bedtime. 20 g 1   No current facility-administered medications for this visit.     Review of Systems General: weight loss  HEENT: no complaints  Lungs: SOB with exertion, chronic  Cardiac: no complaints  GI: nausea and diarrhea due to metformin  GU: blood in urine vs vaginal; positive for hot flashes for years  Musculoskeletal: joint pain  Extremities: no complaints  Skin: no complaints  Neuro: no complaints  Endocrine: no complaints  Psych: no complaints       Objective:  Physical Examination:  BP 134/70   Pulse 86   Temp (!) 97.1 F (36.2 C) (Tympanic)   Resp 18   Ht 5' 4"  (1.626 m)   Wt 223 lb 1.8 oz (101.2 kg)   SpO2 98%   BMI 38.30 kg/m    ECOG Performance Status: 0 - Asymptomatic  General appearance: alert, cooperative and appears stated age HEENT:PERRLA, extra ocular movement intact and sclera clear, anicteric Lymph node survey: non-palpable, axillary, inguinal, supraclavicular Cardiovascular: regular rate and rhythm Respiratory: normal air entry, lungs clear to auscultation Abdomen: soft, non-tender, without masses or organomegaly, nondistended, no hernias  and well healed incision Back: inspection of back is normal Extremities: extremities normal, atraumatic, no cyanosis or edema Neurological exam reveals alert, oriented, normal speech, no focal findings or movement disorder noted.  Pelvic: exam chaperoned by nurse;  Vulva: normal appearing vulva with no masses, tenderness or lesions; Vagina: normal vagina; Adnexa: no masses, limited by habitus; Uterus: uterus is unable to be palpated due to habitus but is nontender and not grossly enlarged; Cervix: no lesions;     Lab Review Labs on site today:   Chemistry      Component Value Date/Time   NA 133 (L) 12/11/2016 2009   K 3.7 12/11/2016 2009   CL 98 (L) 12/11/2016 2009   CO2 26 12/11/2016 2009   BUN 6 12/11/2016 2009   CREATININE 0.64 12/11/2016 2009  Component Value Date/Time   CALCIUM 9.2 12/11/2016 2009   ALKPHOS 143 (H) 12/11/2016 2009   AST 283 (H) 12/11/2016 2009   ALT 244 (H) 12/11/2016 2009   BILITOT 0.8 12/11/2016 2009     Lab Results  Component Value Date   WBC 9.8 12/11/2016   HGB 17.4 (H) 12/11/2016   HCT 51.0 (H) 12/11/2016   MCV 87.4 12/11/2016   PLT 255 12/11/2016     Radiologic Imaging: CXR ordered    Assessment:  ZURY FAZZINO is a 51 y.o. female diagnosed withcomplex atypical hyperplasia can not exclude endometrial cancer. Medical co-morbidities complicating care: tobacco use, elevated Hb, Hct (work up pending), elevated LFTs most likely due to acute event/fatty liver, HTN and immunocompromised.  Plan:   Problem List Items Addressed This Visit      Genitourinary   Endometrial hyperplasia without atypia, complex - Primary     Other   Elevated hemoglobin (HCC)      We discussed options for management including hormonal therapy versus surgery. Based on definitive therapy and rule out malignancy, we recommend definitive surgical evaluation.  Risks were discussed in detail. These include infection, anesthesia, bleeding, transfusion, wound  separation, vaginal cuff dehiscence, medical issues (blood clots, stroke, heart attack, fluid in the lungs, pneumonia, abnormal heart rhythm, death), possible exploratory surgery with larger incision, lymphedema, lymphocyst, allergic reaction, injury to adjacent organs (bowel, bladder, blood vessels, nerves, ureters, uterus).   Plan for 1/2 dose insulin the morning of surgery. [facility] for preoperative anesthesia evaluation. We will also discuss with Dr. Candiss Norse. Obtain preop CXR and EKG. Encouraged to stop/decrease smoking. Admit day of surgery for overnight observation.    Evaluation for polycythemia with Dr. Tasia Catchings pending today.   Suggested return to clinic in  4-6 weeks postoperatively.    The patient's diagnosis, an outline of the further diagnostic and laboratory studies which will be required, the recommendation, and alternatives were discussed.  All questions were answered to the patient's satisfaction.  A total of 75  minutes were spent with the patient/family today; 60% was spent in education, counseling and coordination of care for complex atypical hyperplasia.    Gillis Ends, MD    CC:  Dr. Malachy Mood

## 2017-01-15 NOTE — Progress Notes (Signed)
  Oncology Nurse Navigator Documentation Would like to arrange surgery for 8/15 with Dr. Fransisca Connors and Georgianne Fick. Awaiting Dr. Danielle Rankin response. Will contact Dr. Candiss Norse regarding diabtic medication dosing before surgery. Pre and post op teaching completed. All questions answered and copy of instructions provided with AVS. Navigator Location: [facility] (01/15/17 0900)   )Navigator Encounter Type: Initial GynOnc (01/15/17 0900)                     Patient Visit Type: GynOnc (01/15/17 0900)   Barriers/Navigation Needs: Coordination of Care;Education (01/15/17 0900)   Interventions: Coordination of Care (01/15/17 0900)   Coordination of Care:  (Surgery) (01/15/17 0900)        Acuity: Level 2 (01/15/17 0900)   Acuity Level 2: Initial guidance, education and coordination as needed;Educational needs;Ongoing guidance and education throughout treatment as needed (01/15/17 0900)     Time Spent with Patient: 60 (01/15/17 0900)

## 2017-01-15 NOTE — Progress Notes (Signed)
Gynecologic Oncology Consult Visit   Referring Provider: Dr. Malachy Mood  Chief Concern: complex atypical hyperplasia  Subjective:  Kristen Wong is a 51 y.o. G1P1 female who is seen in consultation from Dr. 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 She presented to Dr. Georgianne Fick for evaluation of perimenopausal bleeding. She has had hot flashes for years and abnormal bleeding. She has not had a normal period since her 41's. Evaluation included the following:  Pelvic ultrasound 12/11/16 norma except for endometrial stripe thickened at 61m. Uterus Measurements: 5 x 2 x 3 cm. Sub endometrial cystic structure implying adenomyosis. Bilateral ovaries could not be visualized. No abnormal free fluid.  CT scan A/P: negative for adenopathy or metastatic disease. She hepatic steatosis and cholelithiasis without acute inflammation and aortic atherosclerosis based on calcifications.   Consult with Dr. SGeorgianne Fick7/18/2018. He noted she does not have a history of abnormal pap smears.  Her prior last pap smear was>20 years ago years ago and was read as no abnormalities.  Pap  which was NILM. Endometrial biopsy uterus sounded to 7 cm and pathology "COMPLEX HYPERPLASIA WITH ATYPIA CAN NOT EXCLUDE WELL DIFFERENTIATED ENDOMETRIAL ADENOCARCINOMA."   She presents today for evaluation. She also has an appointment with Dr. YTasia Catchingsfor evaluation of polycythemia with a Hb 17.4 and HCT 51.0. She also has elevated LFTs in the 200's and saw Dr. ETiffany Kocheron 01/09/2017. She had a complete evaluation including the following:  - Comprehensive Metabolic Panel (CMP) - repeat AST 63 and ALT 93.  - Prothrombin Time (INR) - Iron Panel - Ferritin - HBV/HCV (Profile VIII) - [facility] - Hep A Ab, Total - [facility] - AFP, Serum, Tumor Marker - [facility] - Alpha-1-Antitrypsin Phenotyp - [facility] - Mitochondrial (M2) Antibody - [facility] - Antinuclear Antibodies, IFA - [facility] - Actin (Smooth Muscle) Antibody -  [facility] - Ceruloplasmin - [facility]  Workup negative except for positive Hep A total Ab positive. The overall findings were consistent with an acute event (i.e. Viral infection, medication/supplement induced, etc) that created the acutely elevated LFTs. She likely has a degree of fatty liver per CT findings, and lifestyle modifications were recommended as well as Hepatitis B vaccination as she is not immune.         Problem List: Patient Active Problem List   Diagnosis Date Noted  . Endometrial hyperplasia without atypia, complex 01/15/2017  . Elevated hemoglobin ([place] 01/15/2017    Past Medical History: Past Medical History:  Diagnosis Date  . Arthritis   . Diabetes mellitus without complication ([place] [id number]  Type 2    Past Surgical History: Past Surgical History:  Procedure Laterality Date  . CESAREAN SECTION    . KNEE SURGERY     [facility]    Past Gynecologic History:  Menarche: 10 Menstrual details: long history of abnormal vaginal bleeding and cycles.  Menses regular: n/a Last Menstrual Period: unknown History of Abnormal pap: no, benign cellular changes Last pap: see HPI    OB History:  OB History  Gravida Para Term Preterm AB Living  2 1 1   1 1   SAB TAB Ectopic Multiple Live Births          1    # Outcome Date GA Lbr Len/2nd Weight Sex Delivery Anes PTL Lv  2 Term 01/04/89    F CS-LTranv  N LIV  1 AB               Family History: Family History  Problem Relation  Age of Onset  . Diabetes Mother   . Diabetes Father   . Diabetes Sister     Social History: Social History   Social History  . Marital status: Divorced    Spouse name: N/A  . Number of children: N/A  . Years of education: N/A   Occupational History  . Not on file.   Social History Main Topics  . Smoking status: Current Every Day Smoker    Packs/day: 1.00    Years: 30.00  . Smokeless tobacco: Never Used  . Alcohol use No  . Drug use: No  . Sexual activity: Not Currently    Other Topics Concern  . Not on file   Social History Narrative  . No narrative on file    Allergies: Allergies  Allergen Reactions  . Penicillin G Nausea Only    Current Medications: Current Outpatient Prescriptions  Medication Sig Dispense Refill  . Blood Glucose Monitoring Suppl (FIFTY50 GLUCOSE METER 2.0) w/Device KIT Use as directed.    . insulin glargine (LANTUS) 100 unit/mL SOPN Inject 20 Units into the skin at bedtime.    . Insulin Pen Needle (FIFTY50 PEN NEEDLES) 32G X 6 MM MISC Use as directed.    . metFORMIN (GLUCOPHAGE) 500 MG tablet Take 1 tablet (500 mg total) by mouth 2 (two) times daily with a meal. 60 tablet 0  . nystatin cream (MYCOSTATIN) Apply 1 application topically 2 (two) times daily. 30 g 1  . terconazole (TERAZOL 3) 0.8 % vaginal cream Place 1 applicator vaginally at bedtime. 20 g 1   No current facility-administered medications for this visit.     Review of Systems General: weight loss  HEENT: no complaints  Lungs: SOB with exertion, chronic  Cardiac: no complaints  GI: nausea and diarrhea due to metformin  GU: blood in urine vs vaginal; positive for hot flashes for years  Musculoskeletal: joint pain  Extremities: no complaints  Skin: no complaints  Neuro: no complaints  Endocrine: no complaints  Psych: no complaints       Objective:  Physical Examination:  BP 134/70   Pulse 86   Temp (!) 97.1 F (36.2 C) (Tympanic)   Resp 18   Ht 5' 4"  (1.626 m)   Wt 223 lb 1.8 oz (101.2 kg)   SpO2 98%   BMI 38.30 kg/m    ECOG Performance Status: 0 - Asymptomatic  General appearance: alert, cooperative and appears stated age HEENT:PERRLA, extra ocular movement intact and sclera clear, anicteric Lymph node survey: non-palpable, axillary, inguinal, supraclavicular Cardiovascular: regular rate and rhythm Respiratory: normal air entry, lungs clear to auscultation Abdomen: soft, non-tender, without masses or organomegaly, nondistended, no hernias  and well healed incision Back: inspection of back is normal Extremities: extremities normal, atraumatic, no cyanosis or edema Neurological exam reveals alert, oriented, normal speech, no focal findings or movement disorder noted.  Pelvic: exam chaperoned by nurse;  Vulva: normal appearing vulva with no masses, tenderness or lesions; Vagina: normal vagina; Adnexa: no masses, limited by habitus; Uterus: uterus is unable to be palpated due to habitus but is nontender and not grossly enlarged; Cervix: no lesions;     Lab Review Labs on site today:   Chemistry      Component Value Date/Time   NA 133 (L) 12/11/2016 2009   K 3.7 12/11/2016 2009   CL 98 (L) 12/11/2016 2009   CO2 26 12/11/2016 2009   BUN 6 12/11/2016 2009   CREATININE 0.64 12/11/2016 2009  Component Value Date/Time   CALCIUM 9.2 12/11/2016 2009   ALKPHOS 143 (H) 12/11/2016 2009   AST 283 (H) 12/11/2016 2009   ALT 244 (H) 12/11/2016 2009   BILITOT 0.8 12/11/2016 2009     Lab Results  Component Value Date   WBC 9.8 12/11/2016   HGB 17.4 (H) 12/11/2016   HCT 51.0 (H) 12/11/2016   MCV 87.4 12/11/2016   PLT 255 12/11/2016     Radiologic Imaging: CXR ordered    Assessment:  TERRICKA ONOFRIO is a 51 y.o. female diagnosed withcomplex atypical hyperplasia can not exclude endometrial cancer. Medical co-morbidities complicating care: tobacco use, elevated Hb, Hct (work up pending), elevated LFTs most likely due to fatty liver, HTN and immunocompromised.  Plan:   Problem List Items Addressed This Visit      Genitourinary   Endometrial hyperplasia without atypia, complex - Primary     Other   Elevated hemoglobin (HCC)      We discussed options for management including hormonal therapy versus surgery. Based on definitive therapy and rule out malignancy, we recommend definitive surgical evaluation.  Risks were discussed in detail. These include infection, anesthesia, bleeding, transfusion, wound separation, vaginal  cuff dehiscence, medical issues (blood clots, stroke, heart attack, fluid in the lungs, pneumonia, abnormal heart rhythm, death), possible exploratory surgery with larger incision, lymphedema, lymphocyst, allergic reaction, injury to adjacent organs (bowel, bladder, blood vessels, nerves, ureters, uterus).   Plan for 1/2 dose insulin the morning of surgery. [facility] for preoperative anesthesia evaluation. We will also discuss with Dr. Candiss Norse. Obtain preop CXR and EKG. Encouraged to stop/decrease smoking. Admit day of surgery for overnight observation.    Evaluation for polycythemia with Dr. Tasia Catchings pending today.   Suggested return to clinic in  4-6 weeks postoperatively.    The patient's diagnosis, an outline of the further diagnostic and laboratory studies which will be required, the recommendation, and alternatives were discussed.  All questions were answered to the patient's satisfaction.  A total of 75  minutes were spent with the patient/family today; 60% was spent in education, counseling and coordination of care for complex atypical hyperplasia.    Gillis Ends, MD    CC:  Dr. Malachy Mood

## 2017-01-15 NOTE — Patient Instructions (Signed)
Laparoscopy Laparoscopy is a procedure to diagnose diseases in the abdomen. During the procedure, a thin, lighted, pencil-sized instrument called a laparoscope is inserted into the abdomen through an incision. The laparoscope allows your health care provider to look at the organs inside your body. LET [facility] CARE PROVIDER KNOW ABOUT:  Any allergies you have.  All medicines you are taking, including vitamins, herbs, eye drops, creams, and over-the-counter medicines.  Previous problems you or members of your family have had with the use of anesthetics.  Any blood disorders you have.  Previous surgeries you have had.  Medical conditions you have. RISKS AND COMPLICATIONS  Generally, this is a safe procedure. However, problems can occur, which may include:  Infection.  Bleeding.  Damage to other organs.  Allergic reaction to the anesthetics used during the procedure. BEFORE THE PROCEDURE  Do not eat or drink anything after midnight on the night before the procedure or as directed by your health care provider.  Ask your health care provider about: ? Changing or stopping your regular medicines. ? Taking medicines such as aspirin and ibuprofen. These medicines can thin your blood. Do not take these medicines before your procedure if your health care provider instructs you not to.  Plan to have someone take you home after the procedure. PROCEDURE  You may be given a medicine to help you relax (sedative).  You will be given a medicine to make you sleep (general anesthetic).  Your abdomen will be inflated with a gas. This will make your organs easier to see.  Small incisions will be made in your abdomen.  A laparoscope and other small instruments will be inserted into the abdomen through the incisions.  A tissue sample may be removed from an organ in the abdomen for examination.  The instruments will be removed from the abdomen.  The gas will be released.  The incisions  will be closed with stitches (sutures). AFTER THE PROCEDURE  Your blood pressure, heart rate, breathing rate, and blood oxygen level will be monitored often until the medicines you were given have worn off.   This information is not intended to replace advice given to you by your health care provider. Make sure you discuss any questions you have with your health care provider.     Clear Liquid Diet for GYN Oncology Patients Day Before Surgery The day before your scheduled surgery DO NOT EAT any solid foods.  We do want you to drink enough liquids, but NO MILK products.  We do not want you to be dehydrated.  Clear liquids are defined as no milk products and no pieces of any solid food. The following are all approved for you to drink the day before you surgery.  Chicken, Beef or Vegetable Broth (bouillon or consomm) - NO BROTH AFTER MIDNIGHT  Plain Jello  (no fruit)  Water  Strained lemonade or fruit punch  Gatorade (any flavor)  CLEAR Ensure or Boost Breeze  Fruit juices without pulp, such as apple, grape, or cranberry juice  Clear sodas - NO SODA AFTER MIDNIGHT  Ice Pops without bits of fruit or fruit pulp  Honey  Tea or coffee without milk or cream Any foods not on the above list should be avoided.  DIVISION OF GYNECOLOGIC ONCOLOGY BOWEL PREP   The following instructions are extremely important to prepare for your surgery. Please follow them carefully   Step 1: Liquid Diet Instructions   The day before surgery, drink ONLY CLEAR LIQUIDS for breakfast, lunch, dinner and throughout the day.  Drink at least 64 oz of fluid.             CLEAR LIQUID EXAMPLES:             Beef, chicken or vegetable broth, sodas, coffee, tea (sugar, lemon             artificial sweeteners, honey are acceptable), juices (apple, grape, cranberry, any    mixture of clear juices). Kool-Aid, Gatorade, Jell-o (without  fruit), popsicles                          NO MILK, MILK PRODUCTS, NON-DAIRY CREAMERS    Step 2: Laxatives           The evening before surgery:   Time: around 5pm   Follow these instructions carefully.   Administer 1 Dulcolax suppository according to manufacturer instructions on the box. You will need to purchase this laxative at a pharmacy or grocery store.     Individual responses to laxatives vary; this prep may cause multiple bowel movements. It often works in 30 minutes and may take as long as 3 hours. Stay near an available bathroom.    It is important to stay hydrated. Ensure you are still drinking clear liquids.      IMPORTANT: FOR YOUR SAFETY, WE WILL HAVE TO CANCEL YOUR SURGERY IF YOU DO NOT FOLLOW THESE INSTRUCTIONS.    Do not eat anything after midnight (including gum or candy) prior to your surgery.  Avoid drinking carbonated beverages after midnight.  You can have clear liquids up until one hour before you arrive at the hospital. "Nothing by mouth" means no liquids, gum, candy, etc for one hour before your arrival time.    Bowel Symptoms After Surgery After gynecologic surgery, women often have temporary changes in bowel function (constipation and gas pain).  Following are tips to help prevent and treat common bowel problems.  It also tells you when to call the doctor.  This is important because some symptoms might be a sign of a more serious bowel problem such as obstruction (bowel blockage).  These problems are rare but can happen after gynecologic surgery.   Besides surgery, what can temporarily affect bowel function? 1. Dietary changes   2. Decreased physical activity   3.Antibiotics   4. Pain medication   How can I prevent constipation (three days or more without a stool)? 1. Include fiber in your diet: whole grains, raw or dried fruits & vegetables, prunes, prune/pear juiceDrink at least 8 glasses of liquid (preferably water) every day 2. Avoid: ? Gas forming  foods such as broccoli, beans, peas, salads, cabbage, sweet potatoes ? Greasy, fatty, or fried foods 3. Activity helps bowel function return to normal, walk around the house at least 3-4 times each day for 15 minutes or longer, if tolerated.  Rocking in a rocking chair is preferable to sitting still. 4. Stool softeners: these are not laxatives, but serve to soften the stool to avoid straining.  Take 2-4 times a day until normal bowel function returns         Examples: Colace or generic equivalent (Docusafe) 5. Bulk laxatives: provide a concentrated source of fiber.  They do not stimulate the bowel.  Take 1-2 times each day until normal bowel function return.              Examples: Citrucel, Metamucil, Fiberal, Fibercon   What can I take for "Gas Pains"? 1. Simethicone (Mylicon, Gas-X, Maalox-Gas, Mylanta-Gas) take 3-4 times a day 2. Maalox Regular - take 3-4 times a day 3. Mylanta Regular - take 3-4 times a day   What can I take if I become constipated? 1. Start with stool softeners and add additional laxatives below as needed to have a bowel movement every 1-2 days  2. Stool softeners 1-2 tablets, 2 times a day 3. Senakot 1-2 tablets, 1-2 times a day 4. Glycerin suppository can soften hard stool take once a day 5. Bisacodyl suppository once a day  6. Milk of Magnesia 30 mL 1-2 times a day 7. Fleets or tap water enema    What can I do for nausea?  1. Limit most solid foods for 24-48 hours 2. Continue eating small frequent amounts of liquids and/or bland soft foods ? Toast, crackers, cooked cereal (grits, cream of wheat, rice) 3. Benadryl: a mild anti-nausea medicine can be obtained without a prescription. May cause drowsiness, especially if taken with narcotic pain medicines 4. Contact provider for prescription nausea medication     What can I do, or take for diarrhea (more than five loose stools per day)? 1. Drink plenty of clear fluids to prevent dehydration 2. May take Kaopectate,  Pepto-Bismol, Immodium, or probiotics for 1-2 days 3. Annusol or Preparation-H can be helpful for hemorrhoids and irritated tissue around anus   When should I call the doctor?             CONSTIPATION:   Not relieved after three days following the above program VOMITING:  That contains blood, "coffee ground" material  More the three times/hour and unable to keep down nausea medication for more than eight hours  With dry mouth, dark or strong urine, feeling light-headed, dizzy, or confused  With severe abdominal pain or bloating for more than 24 hours DIARRHEA:  That continues for more then 24-48 hours despite treatment  That contains blood or tarry material  With dry mouth, dark or strong urine, feeling light~headed, dizzy, or confused FEVER:  101 F or higher along with nausea, vomiting, gas pain, diarrhea UNABLE TO:  Pass gas from rectum for more than 24 hours  Tolerate liquids by mouth for more than 24 hours     Laparoscopy, Care After Refer to this sheet in the next few weeks. These instructions provide you with information about caring for yourself after your procedure. Your health care provider may also give you more specific instructions. Your treatment has been planned according to current medical practices, but problems sometimes occur. Call your health care provider if you have any problems or questions after your procedure. WHAT TO EXPECT AFTER THE PROCEDURE After your procedure, it is common to have mild discomfort in the throat and abdomen. HOME CARE INSTRUCTIONS  Take over-the-counter and prescription medicines only as told by your health care provider.  Do not drive for 24 hours if you received a sedative.  Return to your normal activities as told by your health care provider.  Do not take baths, swim, or use a hot tub until your health care provider approves. You may shower.  Follow instructions from your health care provider about how to take care of  your incision. Make sure you: ? Wash  your hands with soap and water before you change your bandage (dressing). If soap and water are not available, use hand sanitizer. ? Change your dressing as told by your health care provider. ? Leave stitches (sutures), skin glue, or adhesive strips in place. These skin closures may need to stay in place for 2 weeks or longer. If adhesive strip edges start to loosen and curl up, you may trim the loose edges. Do not remove adhesive strips completely unless your health care provider tells you to do that.  Check your incision area every day for signs of infection. Check for: ? More redness, swelling, or pain. ? More fluid or blood. ? Warmth. ? Pus or a bad smell.  It is your responsibility to get the results of your procedure. Ask your health care provider or the department performing the procedure when your results will be ready. SEEK MEDICAL CARE IF:  There is new pain in your shoulders.  You feel light-headed or faint.  You are unable to pass gas or unable to have a bowel movement.  You feel nauseous or you vomit.  You develop a rash.  You have more redness, swelling, or pain around your incision.  You have more fluid or blood coming from your incision.  Your incision feels warm to the touch.  You have pus or a bad smell coming from your incision.  You have a fever or chills. SEEK IMMEDIATE MEDICAL CARE IF:  Your pain is getting worse.  You have ongoing vomiting.  The edges of your incision open up.  You have trouble breathing.  You have chest pain.   This information is not intended to replace advice given to you by your health care provider. Make sure you discuss any questions you have with your health care provider.   Laparoscopic Hysterectomy, Care After Refer to this sheet in the next few weeks. These instructions provide you with information on caring for yourself after your procedure. Your health care provider may also give  you more specific instructions. Your treatment has been planned according to current medical practices, but problems sometimes occur. Call your health care provider if you have any problems or questions after your procedure. What can I expect after the procedure?  Pain and bruising at the incision sites. You will be given pain medicine to control it.  Menopausal symptoms such as hot flashes, night sweats, and insomnia if your ovaries were removed.  Sore throat from the breathing tube that was inserted during surgery. Follow these instructions at home:  Only take over-the-counter or prescription medicines for pain, discomfort, or fever as directed by your health care provider.  Do not take aspirin. It can cause bleeding.  Do not drive when taking pain medicine.  Follow your health care provider's advice regarding diet, exercise, lifting, driving, and general activities.  Resume your usual diet as directed and allowed.  Get plenty of rest and sleep.  Do not douche, use tampons, or have sexual intercourse for at least 6 weeks, or until your health care provider gives you permission.  Change your bandages (dressings) as directed by your health care provider.  Monitor your temperature and notify your health care provider of a fever.  Take showers instead of baths for 2-3 weeks.  Do not drink alcohol until your health care provider gives you permission.  If you develop constipation, you may take a mild laxative with your health care provider's permission. Bran foods may help with constipation problems. Drinking enough fluids  to keep your urine clear or pale yellow may help as well.  Try to have someone home with you for 1-2 weeks to help around the house.  Keep all of your follow-up appointments as directed by your health care provider. Contact a health care provider if:  You have swelling, redness, or increasing pain around your incision sites.  You have pus coming from your  incision.  You notice a bad smell coming from your incision.  Your incision breaks open.  You feel dizzy or lightheaded.  You have pain or bleeding when you urinate.  You have persistent diarrhea.  You have persistent nausea and vomiting.  You have abnormal vaginal discharge.  You have a rash.  You have any type of abnormal reaction or develop an allergy to your medicine.  You have poor pain control with your prescribed medicine. Get help right away if:  You have chest pain or shortness of breath.  You have severe abdominal pain that is not relieved with pain medicine.  You have pain or swelling in your legs. This information is not intended to replace advice given to you by your health care provider. Make sure you discuss any questions you have with your health care provider. Document Released: 03/17/2013 Document Revised: 11/02/2015 Document Reviewed: 12/15/2012 Elsevier Interactive Patient Education  2017 [employer].

## 2017-01-15 NOTE — Progress Notes (Signed)
Spotting for several months and she thought she was going through menopause. She has not had normal period lasting 5-7 days in years.

## 2017-01-15 NOTE — Addendum Note (Signed)
Addended by: Gillis Ends on: 01/15/2017 01:29 PM   Modules accepted: Orders, SmartSet

## 2017-01-15 NOTE — Progress Notes (Signed)
[facility] Initial Consultation  Patient Care Team: Glendon Axe, MD as PCP - General (Internal Medicine)  CHIEF COMPLAINTS/PURPOSE OF CONSULTATION: I have high blood count.  HISTORY OF PRESENTING ILLNESS: Kristen Wong 51 y.o. female with PMH listed as below was referred by he primary care provider Dr.Singh for evaluation of high hemoglobin.  Patient is accompanied by her daughter and sister to the clinic visit. She recently has D&C for evaluation of postmenopausal bleeding. Pathology showed endometrium hyperplasia, but a well differentiated neoplasm can not be excluded. She was seen by GynOnc Dr.Secord today and she will get a hysterotomy next week. She also reports recently newly diagnosed DM for which she was started on Metformin and she is having some GI side effects and her appetite is decreased. She has had unintentional weight loss as well.  Patient otherwise feeling well. She smokes 1 pack of cigarettes/day for about 35 years. She lives at home with daughter.  Denies fatigue, fever/chills, chest pain, SOB, headache, vision changes, abdominal pain, previous clots.   Review of Systems  Constitutional: Positive for unexpected weight change.  HENT:  Negative.   Eyes: Negative.   Respiratory: Negative.   Cardiovascular: Negative.   Endocrine: Negative.   Genitourinary: Negative.    Musculoskeletal: Negative.   Skin: Negative.   Neurological: Negative.   Hematological: Negative.   Psychiatric/Behavioral: Negative.     MEDICAL HISTORY: Past Medical History:  Diagnosis Date  . Arthritis   . Diabetes mellitus without complication ([place]) 1749   Type 2    SURGICAL HISTORY: Past Surgical History:  Procedure Laterality Date  . CESAREAN SECTION    . KNEE SURGERY     [facility]    SOCIAL HISTORY: Social History   Social History  . Marital status: Divorced    Spouse name: N/A  . Number of children: N/A  . Years of education: N/A   Occupational History  .  Not on file.   Social History Main Topics  . Smoking status: Current Every Day Smoker    Packs/day: 1.00    Years: 30.00  . Smokeless tobacco: Never Used  . Alcohol use No  . Drug use: No  . Sexual activity: Not Currently   Other Topics Concern  . Not on file   Social History Narrative  . No narrative on file    FAMILY HISTORY Family History  Problem Relation Age of Onset  . Diabetes Mother   . Diabetes Father   . Diabetes Sister     ALLERGIES:  is allergic to penicillin g.  MEDICATIONS:  Current Outpatient Prescriptions  Medication Sig Dispense Refill  . Blood Glucose Monitoring Suppl (FIFTY50 GLUCOSE METER 2.0) w/Device KIT Use as directed.    . insulin glargine (LANTUS) 100 unit/mL SOPN Inject 20 Units into the skin at bedtime.    . Insulin Pen Needle (FIFTY50 PEN NEEDLES) 32G X 6 MM MISC Use as directed.    . metFORMIN (GLUCOPHAGE) 500 MG tablet Take 1 tablet (500 mg total) by mouth 2 (two) times daily with a meal. 60 tablet 0  . nystatin cream (MYCOSTATIN) Apply 1 application topically 2 (two) times daily. 30 g 1  . terconazole (TERAZOL 3) 0.8 % vaginal cream Place 1 applicator vaginally at bedtime. 20 g 1   No current facility-administered medications for this visit.     PHYSICAL EXAMINATION:  ECOG PERFORMANCE STATUS: 0 - Asymptomatic   Vitals:   01/15/17 0832  BP: 134/70  Pulse: 86  Temp: Marland Kitchen)  97.1 F (36.2 C)    Filed Weights   01/15/17 0832  Weight: 223 lb (101.2 kg)    Physical Exam GENERAL: No distress, well nourished. Obese.  SKIN:  No rashes or significant lesions  HEAD: Normocephalic, No masses, lesions, tenderness or abnormalities  EYES: Conjunctiva are pink, non icteric ENT: External ears normal ,lips , buccal mucosa, and tongue normal and mucous membranes are moist  LYMPH: No palpable cervical and axillary lymphadenopathy  LUNGS: Clear to auscultation, no crackles or wheezes HEART: Regular rate & rhythm, no murmurs, no gallops, S1  normal and S2 normal  ABDOMEN: Abdomen soft, non-tender, normal bowel sounds, I did not appreciate any  masses or organomegaly  MUSCULOSKELETAL: No CVA tenderness and no tenderness on percussion of the back or rib cage.  EXTREMITIES: No edema, no skin discoloration or tenderness NEURO: Alert & oriented, no focal motor/sensory deficits.   LABORATORY DATA: I have personally reviewed the data as listed: CBC Latest Ref Rng & Units 01/15/2017 12/11/2016  WBC 3.6 - 11.0 K/uL 10.4 9.8  Hemoglobin 12.0 - 16.0 g/dL 16.6(H) 17.4(H)  Hematocrit 35.0 - 47.0 % 47.4(H) 51.0(H)  Platelets 150 - 440 K/uL 329 255   CMP Latest Ref Rng & Units 12/11/2016  Glucose 65 - 99 mg/dL 470(H)  BUN 6 - 20 mg/dL 6  Creatinine 0.44 - 1.00 mg/dL 0.64  Sodium 135 - 145 mmol/L 133(L)  Potassium 3.5 - 5.1 mmol/L 3.7  Chloride 101 - 111 mmol/L 98(L)  CO2 22 - 32 mmol/L 26  Calcium 8.9 - 10.3 mg/dL 9.2  Total Protein 6.5 - 8.1 g/dL 8.0  Total Bilirubin 0.3 - 1.2 mg/dL 0.8  Alkaline Phos 38 - 126 U/L 143(H)  AST 15 - 41 U/L 283(H)  ALT 14 - 54 U/L 244(H)    RADIOGRAPHIC STUDIES: I have personally reviewed the radiological images as listed and agree with the findings in the report 12/11/2016 CT abdomen pelvis w/o contrast IMPRESSION: 1. No acute abnormality of the abdomen or pelvis. 2. Hepatic steatosis and cholelithiasis without acute inflammation. 3.  Aortic Atherosclerosis (ICD10-I70.0).   ASSESSMENT/PLAN 1. Polycythemia   2. Elevated hemoglobin (HCC)   3. Class 2 obesity with serious comorbidity and body mass index (BMI) of 38.0 to 38.9 in adult, unspecified obesity type   4. Tobacco abuse counseling   5. Type 2 diabetes mellitus without complication, without long-term current use of insulin ([place])   6. Weight loss due to medication   7. Cancer screening    # Lab results were discussed with patient and her family members. She has polycythemia, primary vs secondary.  Likely secondary to her heavy smoking and  possible underlying sleep apnea as well.  # I will order CBC w differential, Pathology smear review, Epo level, carboxyhemoglobin level, JAK2 mutation analysis, PT, PTT.  # I discussed in detail about smoke cessation, life style modification and possibly need of sleep study in the future.  I recommend her to start low dose Aspirin 53m. Since she will have hystectomy next week, I suggest her to start Aspirin after she heals from her procedure.  # Her weight loss maybe secondary to her recent start of metformin.  # Discuss about the need of a colonoscopy and annual mammogram. Patient prefers to revisit these after she finishes her hystectomy.   See in 3 weeks to discuss about lab results.   Orders Placed This Encounter  Procedures  . CBC with Differential/Platelet    Standing Status:   Future  Standing Expiration Date:   01/15/2018  . Pathologist smear review    Standing Status:   Future    Standing Expiration Date:   02/15/2017  . JAK2 Exons 12-15    Standing Status:   Future    Standing Expiration Date:   01/15/2018  . Erythropoietin    Standing Status:   Future    Standing Expiration Date:   01/15/2018  . Carboxyhemoglobin    Standing Status:   Future    Standing Expiration Date:   01/15/2018  . Protime-INR    Standing Status:   Future    Standing Expiration Date:   01/15/2018    All questions were answered. The patient knows to call the clinic with any problems, questions or concerns. Total face to face encounter time for this patient visit was 60 min. >50% of the time was  spent in counseling and coordination of care.  Thank you for this kind referral and the opportunity to participate in the care of this patient. A copy of today's note is routed to referring provider Dr.Singh.    Earlie Server, MD  01/15/2017 9:53 AM

## 2017-01-16 ENCOUNTER — Telehealth: Payer: Self-pay

## 2017-01-16 LAB — CARBON MONOXIDE, BLOOD (PERFORMED AT REF LAB): Carbon Monoxide, Blood: 9 % — ABNORMAL HIGH (ref 0.0–3.6)

## 2017-01-16 LAB — ERYTHROPOIETIN: Erythropoietin: 17.1 m[IU]/mL (ref 2.6–18.5)

## 2017-01-16 NOTE — Telephone Encounter (Signed)
  Oncology Nurse Navigator Documentation Voicemail left with Ms. Kristen Wong to notify of pre op appt. Asked to return call for confirmation. Navigator Location: [facility] (01/16/17 1300)   )Navigator Encounter Type: Telephone (01/16/17 1300) Telephone: Appt Confirmation/Clarification;Outgoing Call (01/16/17 1300)                                                  Time Spent with Patient: 15 (01/16/17 1300)

## 2017-01-20 ENCOUNTER — Encounter
Admission: RE | Admit: 2017-01-20 | Discharge: 2017-01-20 | Disposition: A | Discharge status: Home / Self Care | Payer: BLUE CROSS/BLUE SHIELD | Source: Ambulatory Visit | Attending: Obstetrics and Gynecology | Admitting: Obstetrics and Gynecology

## 2017-01-20 ENCOUNTER — Telehealth: Payer: Self-pay

## 2017-01-20 HISTORY — DX: Cardiac murmur, unspecified: R01.1

## 2017-01-20 HISTORY — DX: Disease of blood and blood-forming organs, unspecified: D75.9

## 2017-01-20 HISTORY — DX: Major depressive disorder, single episode, unspecified: F32.9

## 2017-01-20 HISTORY — DX: Gastro-esophageal reflux disease without esophagitis: K21.9

## 2017-01-20 HISTORY — DX: Depression, unspecified: F32.A

## 2017-01-20 HISTORY — DX: Fatty (change of) liver, not elsewhere classified: K76.0

## 2017-01-20 NOTE — Telephone Encounter (Signed)
  Oncology Nurse Navigator Documentation Received voicemail from Dr. Candiss Norse that Ms. Kristen Wong is only taking oral diabetic medication. Call placed to Ms. Kristen Wong to reconfirm her insulin Lantus dose of 20 units at bedtime. Ms. Kristen Wong verified and confirmed that it was prescribed by Dr. Candiss Norse. Message left at Dr. Keturah Barre office regarding. Ms. Kristen Wong instructed to take 10 units the night before surgery per Dr. Theora Gianotti unless Dr. Candiss Norse states otherwise. Navigator Location: [facility] (01/20/17 0900)   )Navigator Encounter Type: Telephone (01/20/17 0900) Telephone: Incoming Call;Outgoing Call (01/20/17 0900)                                                  Time Spent with Patient: 15 (01/20/17 0900)

## 2017-01-20 NOTE — Pre-Procedure Instructions (Signed)
Table of Contents for Miscellaneous Notes Telephone Encounter - Antionette Char, RN - 01/14/2017 4:49 PM EDT Telephone Encounter - Antionette Char, RN - 01/13/2017 7:59 AM EDT   Telephone Encounter - Antionette Char, RN - 01/14/2017 4:49 PM EDT Spoke with patient and advised of lab results. Advised that the elevation is likely from an acute event as mentioned below (infection/medications). Recheck of HFP scheduled for Thursday August 30th at 9:30 am. Order placed and linked with appointment. Advised patient I would mail her information regarding all her results as well as a prescription to take to [facility] Department for Hepatitis B vaccination as she is not immune. She was agreeable to this as she would like to further protect her liver. Mailed patient information on Fatty Liver including a Fatty Liver Diet. Patient verbalized understanding to all information provided today and will call back with any questions or concerns.   Back to top of Miscellaneous Notes Telephone Encounter - Antionette Char, RN - 01/13/2017 7:59 AM EDT ----- Message from Darcel Bayley, [place] sent at 01/11/2017 1:55 PM EDT ----- Please call patient - LFTs are much improved, now just mildly elevated. Serologic work-up was negative for other etiologies of liver disease. Given this improvement, she likely experienced an acute event (i.e. Viral infection, medication/supplement induced, etc) that created the acutely elevated LFTs. She likely has a degree of fatty liver per CT findings, but this can be managed with lifestyle modifications. At this time, let's plan to repeat an HFP in 1 month to continue to monitor LFTs. Patient will also need hepatitis B booster due to lack of immunity. Thanks!  Back to top of Miscellaneous Notes   Plan of Treatment - as of this encounter

## 2017-01-20 NOTE — Patient Instructions (Signed)
  Your procedure is scheduled on: 01-22-17 [facility] Report to Same Day Surgery 2nd floor medical mall [facility] Entrance-take elevator on left to 2nd floor.  Check in with surgery information desk.) To find out your arrival time please call [phone] between 1PM - 3PM on 01-21-17 TUESDAY  Remember: Instructions that are not followed completely may result in serious medical risk, up to and including death, or upon the discretion of your surgeon and anesthesiologist your surgery may need to be rescheduled.    _x___ 1. Do not eat food or drink liquids after midnight. No gum chewing or hard candies.     __x__ 2. No Alcohol for 24 hours before or after surgery.   __x__3. No Smoking for 24 prior to surgery.   ____  4. Bring all medications with you on the day of surgery if instructed.    __x__ 5. Notify your doctor if there is any change in your medical condition     (cold, fever, infections).     Do not wear jewelry, make-up, hairpins, clips or nail polish.  Do not wear lotions, powders, or perfumes. You may wear deodorant.  Do not shave 48 hours prior to surgery. Men may shave face and neck.  Do not bring valuables to the hospital.    [facility] is not responsible for any belongings or valuables.               Contacts, dentures or bridgework may not be worn into surgery.  Leave your suitcase in the car. After surgery it may be brought to your room.  For patients admitted to the hospital, discharge time is determined by yourtreatment team.   Patients discharged the day of surgery will not be allowed to drive home.  You will need someone to drive you home and stay with you the night of your procedure.    Please read over the following fact sheets that you were given:     ____ Take anti-hypertensive (unless it includes a diuretic), cardiac, seizure, asthma,anti-reflux and psychiatric medicines. These include:  1. NONE  2.  3.  4.  5.  6.  ____Fleets enema or Magnesium  Citrate as directed.   _x___ Use CHG Soap or sage wipes as directed on instruction sheet   ____ Use inhalers on the day of surgery and bring to hospital day of surgery  _X___ Stop Metformin and Janumet 2 days prior to surgery-LAST DOSE OF METFORMIN NOW(01-20-17)-PT TOOK AM DOSE ON 01-20-17    _X___ Take 1/2 of usual insulin dose the night before surgery and none on the morning surgery-TAKE 10 UNITS OF LANTUS Tuesday NIGHT AND NO INSULIN THE MORNING OF SURGERY  ____ Follow recommendations from Cardiologist, Pulmonologist or PCP regarding stopping Aspirin, Coumadin, Pllavix ,Eliquis, Effient, or Pradaxa, and Pletal.  X____Stop Anti-inflammatories such as Advil, Aleve, IBUPROFEN, Motrin, Naproxen, Naprosyn, Goodies powders or aspirin products NOW-OK to take Tylenol    ____ Stop supplements until after surgery.   ____ Bring C-Pap to the hospital.

## 2017-01-21 ENCOUNTER — Ambulatory Visit
Admission: RE | Admit: 2017-01-21 | Discharge: 2017-01-21 | Disposition: A | Discharge status: Home / Self Care | Payer: BLUE CROSS/BLUE SHIELD | Source: Ambulatory Visit | Attending: Obstetrics and Gynecology | Admitting: Obstetrics and Gynecology

## 2017-01-21 DIAGNOSIS — I700 Atherosclerosis of aorta: Secondary | ICD-10-CM | POA: Diagnosis not present

## 2017-01-21 DIAGNOSIS — Z01818 Encounter for other preprocedural examination: Secondary | ICD-10-CM | POA: Insufficient documentation

## 2017-01-21 DIAGNOSIS — N8501 Benign endometrial hyperplasia: Secondary | ICD-10-CM | POA: Insufficient documentation

## 2017-01-21 DIAGNOSIS — Z01810 Encounter for preprocedural cardiovascular examination: Secondary | ICD-10-CM | POA: Diagnosis not present

## 2017-01-21 DIAGNOSIS — J410 Simple chronic bronchitis: Secondary | ICD-10-CM | POA: Diagnosis not present

## 2017-01-21 LAB — TYPE AND SCREEN
ABO/RH(D): O POS
Antibody Screen: NEGATIVE

## 2017-01-21 MED ORDER — METRONIDAZOLE IN NACL 5-0.79 MG/ML-% IV SOLN
500.0000 mg | INTRAVENOUS | Status: AC
  Administered 2017-01-22: 500 mg via INTRAVENOUS
  Filled 2017-01-21: qty 100

## 2017-01-21 MED ORDER — CIPROFLOXACIN IN D5W 400 MG/200ML IV SOLN
400.0000 mg | INTRAVENOUS | Status: AC
  Administered 2017-01-22: 400 mg via INTRAVENOUS

## 2017-01-21 NOTE — Pre-Procedure Instructions (Signed)
Glendon Axe, MD - 12/17/2016 9:30 AM EDT Formatting of this note may be different from the original.   Chief Complaint  Patient presents with  . PPD Reading  ED follow up   HPI  Kristen Wong is a 51 y.o. female returning for follow-up. History of osteoarthritis of both knees. Left knee arthroscopy for torn meniscus in 2010. She previously brought forms requesting accommodation at work. 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  Patient was evaluated in the emergency department on December 11, 2016 for vaginal bleeding, elevated liver enzymes and severe hyperglycemia. Elevated AST/ALT/alkaline phosphatase level of 283/244/143 (in Care Everywhere). CT abdomen and pelvis revealed hepatic steatosis and cholelithiasis without acute inflammation. No acute abnormality of abdomen or pelvis. Aortic atherosclerosis. Newly diagnosed type 2 diabetes: She is taking metformin 500 mg twice daily as instructed upon discharge. She does not have a glucometer yet. Patient plans to get one when she gets paid. Positive family history: Mother and father have type 2 diabetes. Cellulitis: Patient complains of redness, increased warmth and discomfort of left breast after recent cat bite.   CT abdomen and pelvis on December 11, 2016; IMPRESSION: 1. No acute abnormality of the abdomen or pelvis. 2. Hepatic steatosis and cholelithiasis without acute inflammation. 3.Aortic Atherosclerosis (ICD10-I70.0).  Office Visit on 12/17/2016  Component Date Value Ref Range Status  . WBC (White Blood Cell Count) 12/17/2016 8.7 4.1 - 10.2 10^3/uL Final  . RBC (Red Blood Cell Count) 12/17/2016 5.72* 4.04 - 5.48 10^6/uL Final  . Hemoglobin 12/17/2016 17.4* 12.0 - 15.0 gm/dL Final  . Hematocrit 12/17/2016 48.9* 35.0 - 47.0 % Final  . MCV (Mean Corpuscular Volume) 12/17/2016 85.5 80.0 - 100.0 fl Final  . MCH (Mean Corpuscular Hemoglobin) 12/17/2016 30.4 27.0 - 31.2 pg Final  . MCHC (Mean Corpuscular Hemoglobin *  12/17/2016 35.6 32.0 - 36.0 gm/dL Final  . Platelet Count 12/17/2016 150 - 450 10^3/uL Corrected  . RDW-CV (Red Cell Distribution Widt* 12/17/2016 13.2 11.6 - 14.8 % Final  . MPV (Mean Platelet Volume) 12/17/2016 9.4 - 12.4 fl Corrected  . Neutrophils 12/17/2016 4.16 1.50 - 7.80 10^3/uL Final  . Lymphocytes 12/17/2016 3.90* 1.00 - 3.60 10^3/uL Final  . Monocytes 12/17/2016 0.49 0.00 - 1.50 10^3/uL Final  . Eosinophils 12/17/2016 0.12 0.00 - 0.55 10^3/uL Final  . Basophils 12/17/2016 0.03 0.00 - 0.09 10^3/uL Final  . Neutrophil % 12/17/2016 47.8 32.0 - 70.0 % Final  . Lymphocyte % 12/17/2016 44.7 10.0 - 50.0 % Final  . Monocyte % 12/17/2016 5.6 4.0 - 13.0 % Final  . Eosinophil % 12/17/2016 1.4 1.0 - 5.0 % Final  . Basophil% 12/17/2016 0.3 0.0 - 2.0 % Final  . Immature Granulocyte % 12/17/2016 0.2 <=0.7 % Final  . Immature Granulocyte Count 12/17/2016 0.02 <=0.06 10^3/L Final  . Glucose 12/17/2016 274* 70 - 110 mg/dL Final  . Sodium 12/17/2016 135* 136 - 145 mmol/L Final  . Potassium 12/17/2016 4.3 3.6 - 5.1 mmol/L Final  . Chloride 12/17/2016 100 97 - 109 mmol/L Final  . Carbon Dioxide (CO2) 12/17/2016 27.6 22.0 - 32.0 mmol/L Final  . Urea Nitrogen (BUN) 12/17/2016 13 7 - 25 mg/dL Final  . Creatinine 12/17/2016 0.4* 0.6 - 1.1 mg/dL Final  . Glomerular Filtration Rate (eGFR),* 12/17/2016 168 >60 mL/min/1.73sq m Final  . Calcium 12/17/2016 9.6 8.7 - 10.3 mg/dL Final  . AST 12/17/2016 263* 8 - 39 U/L Final  . ALT 12/17/2016 230* 5 - 38 U/L  Final  . Alk Phos (alkaline Phosphatase) 12/17/2016 98 34 - 104 U/L Final  . Albumin 12/17/2016 4.1 3.5 - 4.8 g/dL Final  . Bilirubin, Total 12/17/2016 0.8 0.3 - 1.2 mg/dL Final  . Protein, Total 12/17/2016 7.2 6.1 - 7.9 g/dL Final  . A/G Ratio 12/17/2016 1.3 1.0 - 5.0 gm/dL Final  . Hemoglobin A1C 12/17/2016 12.7* 4.2 - 5.6 % Final  . Average Blood Glucose (Calc) 12/17/2016 318 mg/dL Final  . Cholesterol, Total 12/17/2016 203* 100 - 200 mg/dL Final    . Triglyceride 12/17/2016 268* 35 - 199 mg/dL Final  . HDL (High Density Lipoprotein) Cho* 12/17/2016 35.5 35.0 - 85.0 mg/dL Final  . LDL (Low Density Lipoprotien), Cal* 12/17/2016 114 0 - 130 mg/dL Final  . VLDL Cholesterol 12/17/2016 54 mg/dL Final  . Cholesterol/HDL Ratio 12/17/2016 5.7 Final  . Thyroid Stimulating Hormone (TSH) 12/17/2016 1.101 0.450-5.330 uIU/ml uIU/mL Final  . Color 12/17/2016 Yellow Yellow, Straw, Blue Final  . Clarity 12/17/2016 Cloudy* Clear, Slightly Cloudy, Turbid Other Final  . Specific Gravity 12/17/2016 1.025 1.000 - 1.030 Final  . pH, Urine 12/17/2016 5.5 5.0 - 8.0 Final  . Protein, Urinalysis 12/17/2016 30 * Negative, Trace mg/dL Final  . Glucose, Urinalysis 12/17/2016 Negative Negative mg/dL Final  . Ketones, Urinalysis 12/17/2016 Trace* Negative mg/dL Final  . Blood, Urinalysis 12/17/2016 Large* Negative Final  . Nitrite, Urinalysis 12/17/2016 Positive* Negative Final  . Leukocyte Esterase, Urinalysis 12/17/2016 Small* Negative Final  . White Blood Cells, Urinalysis 12/17/2016 10-50* None Seen, 0-3 /hpf Final  . Red Blood Cells, Urinalysis 12/17/2016 4-10* None Seen, 0-3 /hpf Final  . Bacteria, Urinalysis 12/17/2016 Many* None Seen /hpf Final  . Squamous Epithelial Cells, Urinaly* 12/17/2016 Moderate* Rare, Few, None Seen /hpf Final  . Creatinine, Random Urine 12/17/2016 167.5 mg/dL Final  . Urine Albumin, Random 12/17/2016 265 mg/L Final  . Urine Albumin/Creatinine Ratio 12/17/2016 158.2* <30.0 ug/mg Final   Current Outpatient Prescriptions: fluticasone (FLONASE) 50 mcg/actuation nasal spray, Place 2 sprays into both nostrils once daily., PRN Not Currently Taking ibuprofen (ADVIL,MOTRIN) 200 MG tablet, Take 200 mg by mouth every 6 (six) hours as needed for Pain., PRN Not Currently Taking metFORMIN (GLUCOPHAGE) 500 MG tablet, Take 500 mg by mouth 2 (two) times daily with meals., Taking blood glucose meter kit, Use as directed. doxycycline (MONODOX) 100  MG capsule, Take 1 capsule (100 mg total) by mouth 2 (two) times daily. insulin GLARGINE (LANTUS SOLOSTAR, BASAGLAR KWIKPEN) pen injector (concentration 100 units/mL), Inject 20 Units subcutaneously nightly. pen needle, diabetic 32 gauge x 1/4" needle, Use as directed.  Allergies as of 12/17/2016 - Reviewed 12/17/2016  Allergen Reaction Noted  . Penicillin Nausea 04/12/2016   Past Medical History:  Diagnosis Date  . Allergic state   Past Surgical History:  Procedure Laterality Date  . CESAREAN SECTION  . KNEE ARTHROSCOPY   Social History   Social History  . Marital status: Divorced  Spouse name: N/A  . Number of children: N/A  . Years of education: N/A   Social History Main Topics  . Smoking status: Current Every Day Smoker  Packs/day: 2.00  Types: Cigarettes  . Smokeless tobacco: Never Used  . Alcohol use No  . Drug use: No  . Sexual activity: Defer   Other Topics Concern  . None   Social History Narrative  . None   Family History  Problem Relation Age of Onset  . Asthma Mother  . COPD Mother  . Diabetes type II Father  .  Diabetes type II Sister   Rest of ten system review is negative.  BP 140/72 (BP Location: Left upper arm, Patient Position: Sitting, BP Cuff Size: Adult)  Pulse 91  Ht 162.6 cm (5' 4" )  Wt (!) 103.8 kg (228 lb 12.8 oz)  SpO2 96%  BMI 39.27 kg/m  BP Readings from Last 3 Encounters:  12/17/16 140/72  06/21/16 150/88  04/12/16 130/88   Exam  GENERAL: Alert, oriented, not in acute distress. HEAD: No pallor, no icterus, no nasal congestion, no sinus tenderness. NECK: supple, trachea is central, no thyromegaly, no cervical lymphadenopathy. CARDIOVASCULAR: regular rate and rhythm, normal S1S2, no murmur or gallop. CHEST: clear to auscultation, no rhonchi, no crackles. Good air entry bilaterally. SKIN: + Cellulitis lower inner quadrant of left breast.  ABDOMEN: Soft, non-tender, normal bowel sounds, no hepatosplenomegaly. EXTREMITIES:  no ankle edema, no cyanosis or clubbing, bilateral peripheral pulses are symmetrical.  SPINE: Good range of motion of cervical and lumbar spine. JOINTS: no acute inflammation, good range of motion of large and small joints of bilateral upper and lower extremities.  NEURO: Normal strength and tone in bilateral upper and lower extremities, sensations are grossly intact bilaterally.  PSYCH: good eye contact, appropriate mood and affect, normal speech.  Assessment and Plan  Newly diagnosed type 2 diabetes (CMS-HCC): Obtain hemoglobin A1c with baseline routine labs today. Elevated hemoglobin A1c over 12 on office labs. Start Amaryl 2 mg daily and continue current dose of metformin 500 mg twice daily. Repeat hemoglobin A1c and CMP prior to follow-up.  - CBC w/auto Differential (5 Part) - Comprehensive Metabolic Panel (CMP) - Hemoglobin A1C - Lipid Panel w/calc LDL - Thyroid Stimulating-Hormone (TSH) - Urinalysis w/Microscopic - Microalbumin/Creatinine Ratio, Random Urine - Hemoglobin A1C; Future - Comprehensive Metabolic Panel (CMP); Future - blood glucose meter kit; Use as directed. - Ambulatory Referral to Adult Lifestyle Management Program  Elevated liver enzymes: Hepatic steatosis and cholelithiasis without acute inflammation on recent CT abdomen. Avoid alcohol use. Patient is scheduled for GI evaluation. Repeat CMP prior to follow-up.  Polycythemia: hemoglobin/ hematocrit over 17/48 noted on office labs. Referred to hematology for evaluation.   Cellulitis of breast: Start doxycycline as prescribed. - doxycycline (MONODOX) 100 MG capsule; Take 1 capsule (100 mg total) by mouth 2 (two) times daily.  Primary osteoarthritis of both knees: Forms requesting accommodation at work were previously completed for the patient.  BMI 39: Limit portion size and decrease caloric intake. Low saturated fat diet and increased physical activity recommended.  Return in about 3 months (around  03/19/2017).  Glendon Axe, MD  Diplomate American Board of Internal Medicine  [facility] [place], [place] [address] Phone: [phone]  Future Appointments Date Time Provider [place]  01/09/2017 10:30 AM [place], [place] Retta Diones Lacretia Leigh  03/21/2017 9:30 AM [place] [facility] Lacretia Leigh  03/28/2017 10:45 AM Glendon Axe, MD [facility] Jefm Bryant C   This note was created using voice recognition software. I regret any typographical errors which were not recognized and corrected.       Plan of Treatment - as of this encounter  Upcoming Encounters Upcoming Encounters  Date Type Specialty Care Team Description  01/09/2017 Initial consult Gastroenterology Dorise Hiss, MD  [address]  [place], [place] [address]  [phone]  [phone] ([address]    Tammi Klippel [place], [place]  Katheren Shams  [place], [place] [address]  [phone]  [phone] (Fax)  03/21/2017 Ancillary Orders Lab Glendon Axe, [place]  [facility] [place], [place] [address]  [phone]  [phone] (Fax)    03/28/2017 Office Visit Internal Medicine Glendon Axe, [place]  [facility] [place], [place] [address]  [phone]  [phone] (Fax)     Scheduled Tests Scheduled Tests  Name Priority Associated Diagnoses Order Schedule  Hemoglobin A1C Routine Diabetes mellitus, new onset (CMS-HCC)  Expected: 03/19/2017, Expires: 12/18/2017  Comprehensive Metabolic Panel (CMP) Routine Diabetes mellitus, new onset (CMS-HCC)  Expected: 03/19/2017, Expires: 12/18/2017  CBC w/auto Differential (5 Part) Routine Polycythemia  Expected: 03/24/2017, Expires: 12/22/2017   Scheduled Referrals Scheduled Referrals  Name Priority Associated Diagnoses Order Schedule  Ambulatory Referral to Adult Lifestyle  Management Program Routine Diabetes mellitus, new onset (CMS-HCC)  Ordered: 12/17/2016  Ambulatory Referral to Hematology-Benign Routine Polycythemia  Ordered: 12/22/2016   Procedures - in this encounter  Procedure Name Priority Date/Time Associated Diagnosis Comments  CBC W/AUTO DIFFERENTIAL (5 PART DIFF) -[facility] Routine 12/17/2016 10:27 AM EDT Diabetes mellitus, new onset (CMS-HCC)  Results for this procedure are in the results section.   TSH (THYROID STIMULATING HORMONE) - [facility], [facility] Routine 12/17/2016 10:27 AM EDT Diabetes mellitus, new onset (CMS-HCC)  Results for this procedure are in the results section.   HEMOGLOBIN A1C - [facility] Routine 12/17/2016 10:27 AM EDT Diabetes mellitus, new onset (CMS-HCC)  Results for this procedure are in the results section.   MICROALBUMIN/CREATININE RATIO, RANDOM URINE - [facility], [facility] Routine 12/17/2016 10:27 AM EDT Diabetes mellitus, new onset (CMS-HCC)  Results for this procedure are in the results section.   URINALYSIS W/MICROSCOPIC - [facility], [facility] Routine 12/17/2016 10:27 AM EDT Diabetes mellitus, new onset (CMS-HCC)  Results for this procedure are in the results section.   LIPID PANEL W/CALC LDL - [facility], [facility] Routine 12/17/2016 10:27 AM EDT Diabetes mellitus, new onset (CMS-HCC)  Results for this procedure are in the results section.   COMPREHENSIVE METABOLIC PANEL (CMP) - [facility], [facility] Routine 12/17/2016 10:27 AM EDT Diabetes mellitus, new onset (CMS-HCC)  Results for this procedure are in the results section.    Lab Results - in this encounter  Table of Contents for Lab Results Microalbumin/Creatinine Ratio, Random Urine Urinalysis w/Microscopic Thyroid Stimulating-Hormone (TSH) Lipid Panel w/calc LDL Hemoglobin A1C Comprehensive Metabolic Panel (CMP) CBC w/auto Differential (5 Part)    Microalbumin/Creatinine Ratio, Random  Urine Microalbumin/Creatinine Ratio, Random Urine  Component Value Ref Range Performed At  Creatinine, Random Urine 167.5 mg/dL [facility]  Urine Albumin, Random 265 mg/L [facility]  Urine Albumin/Creatinine Ratio 158.2 (H) Comment:   Urine: Spot collection  (g/mg creatinine)   Normal < 30   Moderately30-299 increased   Clinical >=300 albuminuria <30.0 ug/mg [facility]   Microalbumin/Creatinine Ratio, Random Urine  Specimen  Urine   Microalbumin/Creatinine Ratio, Random Urine  Performing Organization Address City/State/Zipcode Phone Number  [facility] - LAB  [place], [place] [address]    Back to top of Lab Results   Urinalysis w/Microscopic Urinalysis w/Microscopic  Component Value Ref Range Performed At  Color Yellow Yellow, Straw, Blue [facility]  Clarity Cloudy (A) Clear, Slightly Cloudy, Turbid Other [facility]  Specific Gravity 1.025 1.000 - 1.030 [facility]  pH, Urine 5.5 5.0 - 8.0 [place] - LAB  Protein, Urinalysis 30  (A) Negative, Trace mg/dL [place] - LAB  Glucose, Urinalysis Negative Negative mg/dL [place] - LAB  Ketones, Urinalysis Trace (A) Negative mg/dL [facility]  Blood, Urinalysis Large (A) Negative [facility]  Nitrite, Urinalysis Positive (A) Negative [facility]  Leukocyte Esterase, Urinalysis Small (A) Negative [facility]  White Blood Cells, Urinalysis 10-50 (A) None Seen, 0-3 /hpf [facility]  Red Blood Cells, Urinalysis 4-10 (A) None Seen, 0-3 /hpf [facility]  Bacteria, Urinalysis Many (A) None Seen /hpf [place] - LAB  Squamous Epithelial Cells, Urinalysis Moderate (A) Rare, Few, None Seen /hpf [facility]   Urinalysis w/Microscopic  Specimen  Urine   Urinalysis w/Microscopic  Performing Organization Address City/State/Zipcode Phone Number  [facility] - LAB  [place], [place] [address]    Back to top of Lab Results   Thyroid Stimulating-Hormone (TSH) Thyroid Stimulating-Hormone (TSH)  Component Value Ref Range Performed At  Thyroid Stimulating Hormone (TSH) 1.101 0.450-5.330 uIU/ml uIU/mL [place] - LAB   Thyroid Stimulating-Hormone (TSH)  Specimen  Blood   Thyroid Stimulating-Hormone (TSH)  Performing Organization Address City/State/Zipcode Phone Number  [place]  [place], [place] [address]    Back to top of Lab Results   Lipid Panel w/calc LDL Lipid Panel w/calc LDL  Component Value Ref Range Performed At  Cholesterol, Total 203 (H) 100 - 200 mg/dL [facility]  Triglyceride 268 (H) 35 - 199 mg/dL [facility]  HDL (High Density Lipoprotein) Cholesterol 35.5 35.0 - 85.0 mg/dL [facility]  LDL (Low Density Lipoprotien), Calculated 114 0 - 130 mg/dL [facility]  VLDL Cholesterol 54 mg/dL [facility]  Cholesterol/HDL Ratio 5.7  [facility]   Lipid Panel w/calc LDL  Specimen  Blood   Lipid Panel w/calc LDL  Performing Organization Address City/State/Zipcode Phone Number  [facility] - LAB  [place], [place] [address]    Back to top of Lab Results   Hemoglobin A1C Hemoglobin A1C  Component Value Ref Range Performed At  Hemoglobin A1C 12.7 (H) 4.2 - 5.6 % [facility]  Average Blood Glucose (Calc) 318 mg/dL [facility]   Hemoglobin A1C  Specimen  Blood   Hemoglobin A1C  Narrative Performed At    Normal Range:4.2 - 5.6%  Increased Risk:5.7 - 6.4%  Diabetes:>= 6.5%  Glycemic Control for  adults with diabetes:<7%  [facility]    Hemoglobin A1C  Performing Organization Address City/State/Zipcode Phone Number  [facility] - LAB  [place],  [address]    Back to top of Lab Results   Comprehensive Metabolic Panel (CMP) Comprehensive Metabolic Panel (CMP)  Component Value Ref Range Performed At  Glucose 274 (H) 70 - 110 mg/dL [facility]  Sodium 135 (L) 136 - 145 mmol/L [facility]  Potassium 4.3 3.6 - 5.1 mmol/L [facility]  Chloride 100 97 - 109 mmol/L [facility]  Carbon Dioxide (CO2) 27.6 22.0 - 32.0 mmol/L [facility]  Urea Nitrogen (BUN) 13 7 -  25 mg/dL [place] - LAB  Creatinine 0.4 (L) 0.6 - 1.1 mg/dL [place] - LAB  Glomerular Filtration Rate (eGFR), MDRD Estimate 168 >60 mL/min/1.73sq m [place] - LAB  Calcium 9.6 8.7 - 10.3 mg/dL [place] - LAB  AST  263 (H) 8 - 39 U/L [facility]  ALT  230 (H) 5 - 38 U/L [facility]  Alk Phos (alkaline Phosphatase) 98 34 - 104 U/L [facility]  Albumin 4.1 3.5 - 4.8 g/dL [facility]  Bilirubin, Total 0.8 0.3 - 1.2 mg/dL [facility]  Protein, Total 7.2 6.1 - 7.9 g/dL [facility]  A/G Ratio 1.3 1.0 - 5.0 gm/dL [facility]   Comprehensive Metabolic Panel (CMP)  Specimen  Blood   Comprehensive Metabolic Panel (CMP)  Performing Organization Address City/State/Zipcode Phone Number  [place]  [place], [place] [address]    Back to top of Lab Results   CBC w/auto Differential (5 Part) CBC w/auto Differential (5 Part)  Component Value Ref Range Performed At  WBC [facility] Blood Cell Count) 8.7 4.1 - 10.2 10^3/uL [facility]  RBC (Red Blood Cell Count) 5.72 (H) 4.04 - 5.48 10^6/uL [facility]  Hemoglobin 17.4 (H) 12.0 - 15.0 gm/dL [facility]  Hematocrit 48.9 (H) 35.0 - 47.0 % [facility]  MCV (Mean Corpuscular Volume) 85.5 80.0 - 100.0 fl [facility]  MCH (Mean Corpuscular Hemoglobin) 30.4 27.0 - 31.2 pg [facility]  MCHC (Mean Corpuscular Hemoglobin Concentration) 35.6 32.0 - 36.0 gm/dL [facility]  Platelet Count  Comment:  Cannot report platelet count or MPV because of platelet clumps.  This is a corrected result. Previous result was 225 10^3/uL on 12/17/2016 at [place] EDT 150 - 450 10^3/uL [place] - LAB  RDW-CV (Red Cell Distribution Width) 13.2 11.6 - 14.8 % [facility]  MPV (Mean Platelet Volume) Comment: This is a corrected result. Previous result was 11.7 fl on 12/17/2016 at [place] EDT 9.4 - 12.4 fl [place] - LAB  Neutrophils 4.16 1.50 - 7.80 10^3/uL [place] - LAB  Lymphocytes 3.90 (H) 1.00 - 3.60 10^3/uL [place] - LAB  Monocytes 0.49 0.00 - 1.50 10^3/uL [place] - LAB  Eosinophils 0.12 0.00 - 0.55 10^3/uL [facility]  Basophils 0.03 0.00 - 0.09 10^3/uL [facility]  Neutrophil % 47.8 32.0 - 70.0 % [facility]  Lymphocyte % 44.7 10.0 - 50.0 % [facility]  Monocyte % 5.6 4.0 - 13.0 % [facility]  Eosinophil % 1.4 1.0 - 5.0 % [facility]  Basophil% 0.3 0.0 - 2.0 % [facility]  Immature Granulocyte % 0.2 <=0.7 % [facility]  Immature Granulocyte Count 0.02 <=0.06 10^3/L [place] - LAB   CBC w/auto Differential (5 Part)  Specimen  Blood   CBC w/auto Differential (5 Part)  Performing Organization Address City/State/Zipcode Phone Number  [facility] - LAB  [place],  [address]    Back to top of Lab Results  Visit  Diagnoses   Diagnosis  Diabetes mellitus, new onset (CMS-HCC) - Primary  Cellulitis of breast  Inflammatory disease of breast   Polycythemia  Polycythemia, secondary   Elevated liver enzymes  Other nonspecific abnormal serum enzyme levels    Historical Medications - added in this encounter  This list may reflect changes made after this encounter.  Medication Sig. Disp. Refills Start Date End Date  metFORMIN (GLUCOPHAGE) 500 MG tablet  Take 500 mg by mouth 2 (two) times daily with meals.       Images Document Information  Service Providers Document Coverage Dates Jul. 10, 2018  [place], [place] [address]   Encounter Providers Glendon Axe MD (Attending) [phone] (Work) [phone] (Fax) [place] [facility] [place], [place] [address]   Encounter Date Jul. 10, 2018

## 2017-01-21 NOTE — Pre-Procedure Instructions (Signed)
Component Name  Initial consult on 01/09/2017 [place]")' href="[email or web address]/">01/09/2017  Office Visit on 12/17/2016 [place]")' href="[email or web address]/">12/17/2016    Specimen: Blood")'>92  Specimen: Blood")'>274 (H)   Specimen: Blood")'>140  Specimen: Blood")'>135 (L)   Specimen: Blood")'>4.0  Specimen: Blood")'>4.3   Specimen: Blood")'>105  Specimen: Blood")'>100   Specimen: Blood")'>27.3  Specimen: Blood")'>27.6   Specimen: Blood")'>10  Specimen: Blood")'>13   Specimen: Blood")'>0.5 (L)  Specimen: Blood")'>0.4 (L)   Specimen: Blood")'>130  Specimen: Blood")'>168   Specimen: Blood")'>9.6  Specimen: Blood")'>9.6   Specimen: Blood")'>63 (H)  Specimen: Blood")'>263 (H)   Specimen: Blood")'>93 (H)  Specimen: Blood")'>230 (H)   Specimen: Blood")'>89  Specimen: Blood")'>98   Specimen: Blood")'>4.2  Specimen: Blood")'>4.1   Specimen: Blood")'>0.5  Specimen: Blood")'>0.8   Specimen: Blood")'>7.1  Specimen: Blood")'>7.2   Specimen: Blood")'>1.4  Specimen: Blood")'>1.3  Glucose  Sodium  Potassium  Chloride  Carbon Dioxide (CO2)  Urea Nitrogen (BUN)  Creatinine  Glomerular Filtration Rate (eGFR), MDRD Estimate  Calcium  AST   ALT   Alk Phos (alkaline Phosphatase)  Albumin  Bilirubin, Total  Protein, Total  A/G Ratio   DIABETES Component Name  Initial consult on 01/09/2017 [place]")' href="[email or web address]/">01/09/2017  Office Visit on 12/17/2016 [place]")' href="[email or web address]/">12/17/2016    Specimen: Blood")'>92  Specimen: Blood")'>274 (H)   View Detailed Result Report Hemoglobin A1C 12/17/2016")' href="[email or web address] Hemoglobin  A1C/"> Specimen: Blood")'>12.7 (H)   View Detailed Result Report Hemoglobin A1C 12/17/2016")' href="[id number] Hemoglobin A1C/"> Specimen: Blood")'>318

## 2017-01-21 NOTE — Pre-Procedure Instructions (Signed)
Progress Notes Encounter Date: 01/15/2017 Earlie Server, MD  Oncology    [] Hide copied text [place] Initial Consultation  Patient Care Team: Glendon Axe, MD as PCP - General (Internal Medicine)  CHIEF COMPLAINTS/PURPOSE OF CONSULTATION: I have high blood count.  HISTORY OF PRESENTING ILLNESS: Kristen Wong 51 y.o. female with PMH listed as below was referred by he primary care provider Dr.Singh for evaluation of high hemoglobin.  Patient is accompanied by her daughter and sister to the clinic visit. She recently has D&C for evaluation of postmenopausal bleeding. Pathology showed endometrium hyperplasia, but a well differentiated neoplasm can not be excluded. She was seen by GynOnc Dr.Secord today and she will get a hysterotomy next week. She also reports recently newly diagnosed DM for which she was started on Metformin and she is having some GI side effects and her appetite is decreased. She has had unintentional weight loss as well.  Patient otherwise feeling well. She smokes 1 pack of cigarettes/day for about 35 years. She lives at home with daughter.  Denies fatigue, fever/chills, chest pain, SOB, headache, vision changes, abdominal pain, previous clots.   Review of Systems  Constitutional: Positive for unexpected weight change.  HENT:  Negative.   Eyes: Negative.   Respiratory: Negative.   Cardiovascular: Negative.   Endocrine: Negative.   Genitourinary: Negative.    Musculoskeletal: Negative.   Skin: Negative.   Neurological: Negative.   Hematological: Negative.   Psychiatric/Behavioral: Negative.     MEDICAL HISTORY:     Past Medical History:  Diagnosis Date  . Arthritis   . Diabetes mellitus without complication ([place]) 7425   Type 2    SURGICAL HISTORY:      Past Surgical History:  Procedure Laterality Date  . CESAREAN SECTION    . KNEE SURGERY     [facility]    SOCIAL HISTORY: Social History        Social History  .  Marital status: Divorced    Spouse name: N/A  . Number of children: N/A  . Years of education: N/A      Occupational History  . Not on file.        Social History Main Topics  . Smoking status: Current Every Day Smoker    Packs/day: 1.00    Years: 30.00  . Smokeless tobacco: Never Used  . Alcohol use No  . Drug use: No  . Sexual activity: Not Currently       Other Topics Concern  . Not on file      Social History Narrative  . No narrative on file    FAMILY HISTORY      Family History  Problem Relation Age of Onset  . Diabetes Mother   . Diabetes Father   . Diabetes Sister     ALLERGIES: is allergic to penicillin g.  MEDICATIONS:        Current Outpatient Prescriptions  Medication Sig Dispense Refill  . Blood Glucose Monitoring Suppl (FIFTY50 GLUCOSE METER 2.0) w/Device KIT Use as directed.    . insulin glargine (LANTUS) 100 unit/mL SOPN Inject 20 Units into the skin at bedtime.    . Insulin Pen Needle (FIFTY50 PEN NEEDLES) 32G X 6 MM MISC Use as directed.    . metFORMIN (GLUCOPHAGE) 500 MG tablet Take 1 tablet (500 mg total) by mouth 2 (two) times daily with a meal. 60 tablet 0  . nystatin cream (MYCOSTATIN) Apply 1 application topically 2 (two) times daily. 30 g 1  .  terconazole (TERAZOL 3) 0.8 % vaginal cream Place 1 applicator vaginally at bedtime. 20 g 1   No current facility-administered medications for this visit.     PHYSICAL EXAMINATION:  ECOG PERFORMANCE STATUS: 0 - Asymptomatic      Vitals:   01/15/17 0832  BP: 134/70  Pulse: 86  Temp: (!) 97.1 F (36.2 C)       Filed Weights   01/15/17 0832  Weight: 223 lb (101.2 kg)    Physical Exam GENERAL: No distress, well nourished. Obese.  SKIN:  No rashes or significant lesions  HEAD: Normocephalic, No masses, lesions, tenderness or abnormalities  EYES: Conjunctiva are pink, non icteric ENT: External ears normal ,lips , buccal mucosa, and tongue normal  and mucous membranes are moist  LYMPH: No palpable cervical and axillary lymphadenopathy  LUNGS: Clear to auscultation, no crackles or wheezes HEART: Regular rate & rhythm, no murmurs, no gallops, S1 normal and S2 normal  ABDOMEN: Abdomen soft, non-tender, normal bowel sounds, I did not appreciate any  masses or organomegaly  MUSCULOSKELETAL: No CVA tenderness and no tenderness on percussion of the back or rib cage.  EXTREMITIES: No edema, no skin discoloration or tenderness NEURO: Alert & oriented, no focal motor/sensory deficits.   LABORATORY DATA: I have personally reviewed the data as listed: CBC Latest Ref Rng & Units 01/15/2017 12/11/2016  WBC 3.6 - 11.0 K/uL 10.4 9.8  Hemoglobin 12.0 - 16.0 g/dL 16.6(H) 17.4(H)  Hematocrit 35.0 - 47.0 % 47.4(H) 51.0(H)  Platelets 150 - 440 K/uL 329 255   CMP Latest Ref Rng & Units 12/11/2016  Glucose 65 - 99 mg/dL 470(H)  BUN 6 - 20 mg/dL 6  Creatinine 0.44 - 1.00 mg/dL 0.64  Sodium 135 - 145 mmol/L 133(L)  Potassium 3.5 - 5.1 mmol/L 3.7  Chloride 101 - 111 mmol/L 98(L)  CO2 22 - 32 mmol/L 26  Calcium 8.9 - 10.3 mg/dL 9.2  Total Protein 6.5 - 8.1 g/dL 8.0  Total Bilirubin 0.3 - 1.2 mg/dL 0.8  Alkaline Phos 38 - 126 U/L 143(H)  AST 15 - 41 U/L 283(H)  ALT 14 - 54 U/L 244(H)    RADIOGRAPHIC STUDIES: I have personally reviewed the radiological images as listed and agree with the findings in the report 12/11/2016 CT abdomen pelvis w/o contrast IMPRESSION: 1. No acute abnormality of the abdomen or pelvis. 2. Hepatic steatosis and cholelithiasis without acute inflammation. 3. Aortic Atherosclerosis (ICD10-I70.0).   ASSESSMENT/PLAN 1. Polycythemia   2. Elevated hemoglobin (HCC)   3. Class 2 obesity with serious comorbidity and body mass index (BMI) of 38.0 to 38.9 in adult, unspecified obesity type   4. Tobacco abuse counseling   5. Type 2 diabetes mellitus without complication, without long-term current use of insulin ([place])   6.  Weight loss due to medication   7. Cancer screening    # Lab results were discussed with patient and her family members. She has polycythemia, primary vs secondary.  Likely secondary to her heavy smoking and possible underlying sleep apnea as well.  # I will order CBC w differential, Pathology smear review, Epo level, carboxyhemoglobin level, JAK2 mutation analysis, PT, PTT.  # I discussed in detail about smoke cessation, life style modification and possibly need of sleep study in the future.  I recommend her to start low dose Aspirin 79m. Since she will have hystectomy next week, I suggest her to start Aspirin after she heals from her procedure.  # Her weight loss maybe secondary to her  recent start of metformin.  # Discuss about the need of a colonoscopy and annual mammogram. Patient prefers to revisit these after she finishes her hystectomy.   See in 3 weeks to discuss about lab results.        Orders Placed This Encounter  Procedures  . CBC with Differential/Platelet    Standing Status:   Future    Standing Expiration Date:   01/15/2018  . Pathologist smear review    Standing Status:   Future    Standing Expiration Date:   02/15/2017  . JAK2 Exons 12-15    Standing Status:   Future    Standing Expiration Date:   01/15/2018  . Erythropoietin    Standing Status:   Future    Standing Expiration Date:   01/15/2018  . Carboxyhemoglobin    Standing Status:   Future    Standing Expiration Date:   01/15/2018  . Protime-INR    Standing Status:   Future    Standing Expiration Date:   01/15/2018    All questions were answered. The patient knows to call the clinic with any problems, questions or concerns. Total face to face encounter time for this patient visit was 60 min. >50% of the time was  spent in counseling and coordination of care.  Thank you for this kind referral and the opportunity to participate in the care of this patient. A copy of today's note is routed  to referring provider Dr.Singh.   Earlie Server, MD  01/15/2017 9:53 AM    Electronically signed by Earlie Server, MD at 01/15/2017 10:35 AM      Office Visit on 01/15/2017        Detailed Report

## 2017-01-22 ENCOUNTER — Encounter: Payer: Self-pay | Admitting: *Deleted

## 2017-01-22 ENCOUNTER — Ambulatory Visit: Payer: BLUE CROSS/BLUE SHIELD | Admitting: Registered Nurse

## 2017-01-22 ENCOUNTER — Observation Stay
Admission: RE | Admit: 2017-01-22 | Discharge: 2017-01-23 | Disposition: A | Discharge status: Home / Self Care | Payer: BLUE CROSS/BLUE SHIELD | Source: Ambulatory Visit | Attending: Obstetrics and Gynecology | Admitting: Obstetrics and Gynecology

## 2017-01-22 ENCOUNTER — Encounter
Admission: RE | Disposition: A | Discharge status: Home / Self Care | Payer: Self-pay | Source: Ambulatory Visit | Attending: Obstetrics and Gynecology

## 2017-01-22 DIAGNOSIS — F17200 Nicotine dependence, unspecified, uncomplicated: Secondary | ICD-10-CM | POA: Insufficient documentation

## 2017-01-22 DIAGNOSIS — K8020 Calculus of gallbladder without cholecystitis without obstruction: Secondary | ICD-10-CM | POA: Diagnosis not present

## 2017-01-22 DIAGNOSIS — R634 Abnormal weight loss: Secondary | ICD-10-CM | POA: Diagnosis not present

## 2017-01-22 DIAGNOSIS — N736 Female pelvic peritoneal adhesions (postinfective): Secondary | ICD-10-CM | POA: Diagnosis not present

## 2017-01-22 DIAGNOSIS — Z79899 Other long term (current) drug therapy: Secondary | ICD-10-CM | POA: Diagnosis not present

## 2017-01-22 DIAGNOSIS — D751 Secondary polycythemia: Secondary | ICD-10-CM | POA: Diagnosis not present

## 2017-01-22 DIAGNOSIS — B373 Candidiasis of vulva and vagina: Secondary | ICD-10-CM | POA: Insufficient documentation

## 2017-01-22 DIAGNOSIS — E109 Type 1 diabetes mellitus without complications: Secondary | ICD-10-CM | POA: Insufficient documentation

## 2017-01-22 DIAGNOSIS — N83202 Unspecified ovarian cyst, left side: Secondary | ICD-10-CM | POA: Diagnosis not present

## 2017-01-22 DIAGNOSIS — D251 Intramural leiomyoma of uterus: Secondary | ICD-10-CM | POA: Diagnosis not present

## 2017-01-22 DIAGNOSIS — N8501 Benign endometrial hyperplasia: Secondary | ICD-10-CM

## 2017-01-22 DIAGNOSIS — J449 Chronic obstructive pulmonary disease, unspecified: Secondary | ICD-10-CM | POA: Diagnosis not present

## 2017-01-22 DIAGNOSIS — N838 Other noninflammatory disorders of ovary, fallopian tube and broad ligament: Secondary | ICD-10-CM | POA: Diagnosis not present

## 2017-01-22 DIAGNOSIS — Z90710 Acquired absence of both cervix and uterus: Secondary | ICD-10-CM | POA: Diagnosis present

## 2017-01-22 DIAGNOSIS — K760 Fatty (change of) liver, not elsewhere classified: Secondary | ICD-10-CM | POA: Diagnosis not present

## 2017-01-22 DIAGNOSIS — D582 Other hemoglobinopathies: Secondary | ICD-10-CM | POA: Diagnosis not present

## 2017-01-22 DIAGNOSIS — N83201 Unspecified ovarian cyst, right side: Secondary | ICD-10-CM | POA: Insufficient documentation

## 2017-01-22 DIAGNOSIS — F17210 Nicotine dependence, cigarettes, uncomplicated: Secondary | ICD-10-CM | POA: Diagnosis not present

## 2017-01-22 DIAGNOSIS — E119 Type 2 diabetes mellitus without complications: Secondary | ICD-10-CM | POA: Diagnosis not present

## 2017-01-22 DIAGNOSIS — Z794 Long term (current) use of insulin: Secondary | ICD-10-CM | POA: Insufficient documentation

## 2017-01-22 DIAGNOSIS — N8502 Endometrial intraepithelial neoplasia [EIN]: Secondary | ICD-10-CM | POA: Diagnosis not present

## 2017-01-22 DIAGNOSIS — E669 Obesity, unspecified: Secondary | ICD-10-CM | POA: Diagnosis not present

## 2017-01-22 DIAGNOSIS — I700 Atherosclerosis of aorta: Secondary | ICD-10-CM | POA: Diagnosis not present

## 2017-01-22 DIAGNOSIS — N950 Postmenopausal bleeding: Secondary | ICD-10-CM | POA: Diagnosis not present

## 2017-01-22 DIAGNOSIS — Z7982 Long term (current) use of aspirin: Secondary | ICD-10-CM | POA: Diagnosis not present

## 2017-01-22 DIAGNOSIS — K219 Gastro-esophageal reflux disease without esophagitis: Secondary | ICD-10-CM | POA: Diagnosis not present

## 2017-01-22 DIAGNOSIS — Z6838 Body mass index (BMI) 38.0-38.9, adult: Secondary | ICD-10-CM | POA: Diagnosis not present

## 2017-01-22 HISTORY — PX: LAPAROSCOPIC HYSTERECTOMY: SHX1926

## 2017-01-22 LAB — BASIC METABOLIC PANEL
Anion gap: 12 (ref 5–15)
BUN: 10 mg/dL (ref 6–20)
CALCIUM: 9.2 mg/dL (ref 8.9–10.3)
CHLORIDE: 105 mmol/L (ref 101–111)
CO2: 23 mmol/L (ref 22–32)
CREATININE: 0.53 mg/dL (ref 0.44–1.00)
GFR calc Af Amer: >60 mL/min (ref >60)
GFR calc non Af Amer: >60 mL/min (ref >60)
Glucose, Bld: 101 mg/dL — ABNORMAL HIGH (ref 65–99)
POTASSIUM: 4.1 mmol/L (ref 3.5–5.1)
Sodium: 140 mmol/L (ref 135–145)

## 2017-01-22 LAB — GLUCOSE, CAPILLARY
GLUCOSE-CAPILLARY: 156 mg/dL — AB (ref 65–99)
GLUCOSE-CAPILLARY: 200 mg/dL — AB (ref 65–99)
Glucose-Capillary: 95 mg/dL (ref 65–99)
Glucose-Capillary: 138 mg/dL — ABNORMAL HIGH (ref 65–99)
Glucose-Capillary: 191 mg/dL — ABNORMAL HIGH (ref 65–99)

## 2017-01-22 LAB — ABO/RH: ABO/RH(D): O POS

## 2017-01-22 SURGERY — HYSTERECTOMY, TOTAL, LAPAROSCOPIC
Anesthesia: General

## 2017-01-22 MED ORDER — PROPOFOL 10 MG/ML IV BOLUS
INTRAVENOUS | Status: DC | PRN
  Administered 2017-01-22: 150 mg via INTRAVENOUS

## 2017-01-22 MED ORDER — INSULIN GLARGINE 100 UNIT/ML ~~LOC~~ SOLN
20.0000 [IU] | Freq: Every day | SUBCUTANEOUS | Status: DC
  Administered 2017-01-22: 20 [IU] via SUBCUTANEOUS
  Filled 2017-01-22 (×2): qty 0.2

## 2017-01-22 MED ORDER — LIDOCAINE HCL (CARDIAC) 20 MG/ML IV SOLN
INTRAVENOUS | Status: DC | PRN
  Administered 2017-01-22: 100 mg via INTRAVENOUS

## 2017-01-22 MED ORDER — ONDANSETRON HCL 4 MG/2ML IJ SOLN
INTRAMUSCULAR | Status: AC
  Filled 2017-01-22: qty 2

## 2017-01-22 MED ORDER — FENTANYL CITRATE (PF) 100 MCG/2ML IJ SOLN
INTRAMUSCULAR | Status: DC | PRN
  Administered 2017-01-22 (×3): 50 ug via INTRAVENOUS
  Administered 2017-01-22: 100 ug via INTRAVENOUS

## 2017-01-22 MED ORDER — PROPOFOL 10 MG/ML IV BOLUS
INTRAVENOUS | Status: AC
  Filled 2017-01-22: qty 20

## 2017-01-22 MED ORDER — FAMOTIDINE 20 MG PO TABS
ORAL_TABLET | ORAL | Status: AC
  Administered 2017-01-22: 20 mg via ORAL
  Filled 2017-01-22: qty 1

## 2017-01-22 MED ORDER — SODIUM CHLORIDE 0.9 % IV SOLN
INTRAVENOUS | Status: DC
  Administered 2017-01-22 (×2): via INTRAVENOUS

## 2017-01-22 MED ORDER — MORPHINE SULFATE (PF) 4 MG/ML IV SOLN: 1.0000 mg | INTRAVENOUS | Status: DC | PRN

## 2017-01-22 MED ORDER — INSULIN ASPART 100 UNIT/ML ~~LOC~~ SOLN: 0.0000 [IU] | Freq: Every day | SUBCUTANEOUS | Status: DC

## 2017-01-22 MED ORDER — INSULIN ASPART 100 UNIT/ML ~~LOC~~ SOLN
0.0000 [IU] | Freq: Three times a day (TID) | SUBCUTANEOUS | Status: DC
  Administered 2017-01-22 – 2017-01-23 (×2): 3 [IU] via SUBCUTANEOUS
  Filled 2017-01-22 (×2): qty 1

## 2017-01-22 MED ORDER — MIDAZOLAM HCL 2 MG/2ML IJ SOLN
INTRAMUSCULAR | Status: AC
  Filled 2017-01-22: qty 2

## 2017-01-22 MED ORDER — SUGAMMADEX SODIUM 200 MG/2ML IV SOLN
INTRAVENOUS | Status: AC
  Filled 2017-01-22: qty 2

## 2017-01-22 MED ORDER — DEXAMETHASONE SODIUM PHOSPHATE 10 MG/ML IJ SOLN
INTRAMUSCULAR | Status: DC | PRN
  Administered 2017-01-22: 5 mg via INTRAVENOUS

## 2017-01-22 MED ORDER — PHENYLEPHRINE HCL 10 MG/ML IJ SOLN
INTRAMUSCULAR | Status: DC | PRN
  Administered 2017-01-22 (×2): 100 ug via INTRAVENOUS

## 2017-01-22 MED ORDER — KETOROLAC TROMETHAMINE 30 MG/ML IJ SOLN
30.0000 mg | Freq: Four times a day (QID) | INTRAMUSCULAR | Status: DC
  Filled 2017-01-22 (×2): qty 1

## 2017-01-22 MED ORDER — HEPARIN SODIUM (PORCINE) 5000 UNIT/ML IJ SOLN
5000.0000 [IU] | INTRAMUSCULAR | Status: AC
  Administered 2017-01-22: 5000 [IU] via SUBCUTANEOUS

## 2017-01-22 MED ORDER — LACTATED RINGERS IV SOLN
INTRAVENOUS | Status: DC
  Administered 2017-01-22 – 2017-01-23 (×2): via INTRAVENOUS

## 2017-01-22 MED ORDER — HEPARIN SODIUM (PORCINE) 5000 UNIT/ML IJ SOLN
INTRAMUSCULAR | Status: AC
  Administered 2017-01-22: 5000 [IU] via SUBCUTANEOUS
  Filled 2017-01-22: qty 1

## 2017-01-22 MED ORDER — FENTANYL CITRATE (PF) 250 MCG/5ML IJ SOLN
INTRAMUSCULAR | Status: AC
  Filled 2017-01-22: qty 5

## 2017-01-22 MED ORDER — EPHEDRINE SULFATE 50 MG/ML IJ SOLN
INTRAMUSCULAR | Status: AC
  Filled 2017-01-22: qty 1

## 2017-01-22 MED ORDER — INDOCYANINE GREEN 25 MG IV SOLR
INTRAVENOUS | Status: DC | PRN
  Administered 2017-01-22: 20 mg

## 2017-01-22 MED ORDER — FAMOTIDINE 20 MG PO TABS
20.0000 mg | ORAL_TABLET | Freq: Once | ORAL | Status: AC
  Administered 2017-01-22: 20 mg via ORAL

## 2017-01-22 MED ORDER — ROCURONIUM BROMIDE 50 MG/5ML IV SOLN
INTRAVENOUS | Status: AC
  Filled 2017-01-22: qty 1

## 2017-01-22 MED ORDER — INDOCYANINE GREEN 25 MG IV SOLR
INTRAVENOUS | Status: AC
  Filled 2017-01-22: qty 25

## 2017-01-22 MED ORDER — FENTANYL CITRATE (PF) 100 MCG/2ML IJ SOLN
INTRAMUSCULAR | Status: AC
  Administered 2017-01-22: 25 ug via INTRAVENOUS
  Filled 2017-01-22: qty 2

## 2017-01-22 MED ORDER — CIPROFLOXACIN IN D5W 400 MG/200ML IV SOLN
INTRAVENOUS | Status: AC
  Filled 2017-01-22: qty 200

## 2017-01-22 MED ORDER — ACETAMINOPHEN NICU IV SYRINGE 10 MG/ML
INTRAVENOUS | Status: AC
  Filled 2017-01-22: qty 1

## 2017-01-22 MED ORDER — BUPIVACAINE HCL (PF) 0.5 % IJ SOLN
INTRAMUSCULAR | Status: AC
  Filled 2017-01-22: qty 30

## 2017-01-22 MED ORDER — ACETAMINOPHEN 10 MG/ML IV SOLN
INTRAVENOUS | Status: DC | PRN
  Administered 2017-01-22: 1000 mg via INTRAVENOUS

## 2017-01-22 MED ORDER — DEXTROSE-NACL 5-0.45 % IV SOLN
INTRAVENOUS | Status: DC
  Administered 2017-01-22: 15:00:00 via INTRAVENOUS

## 2017-01-22 MED ORDER — HYDRALAZINE HCL 20 MG/ML IJ SOLN
5.0000 mg | Freq: Once | INTRAMUSCULAR | Status: AC | PRN
  Administered 2017-01-22: 5 mg via INTRAVENOUS
  Filled 2017-01-22: qty 0.25

## 2017-01-22 MED ORDER — MIDAZOLAM HCL 2 MG/2ML IJ SOLN
INTRAMUSCULAR | Status: DC | PRN
  Administered 2017-01-22: 2 mg via INTRAVENOUS

## 2017-01-22 MED ORDER — FENTANYL CITRATE (PF) 100 MCG/2ML IJ SOLN
25.0000 ug | INTRAMUSCULAR | Status: DC | PRN
  Administered 2017-01-22 (×4): 25 ug via INTRAVENOUS

## 2017-01-22 MED ORDER — ROCURONIUM BROMIDE 100 MG/10ML IV SOLN
INTRAVENOUS | Status: DC | PRN
  Administered 2017-01-22: 50 mg via INTRAVENOUS
  Administered 2017-01-22: 20 mg via INTRAVENOUS

## 2017-01-22 MED ORDER — MENTHOL 3 MG MT LOZG
1.0000 | LOZENGE | OROMUCOSAL | Status: DC | PRN
  Filled 2017-01-22: qty 9

## 2017-01-22 MED ORDER — OXYCODONE-ACETAMINOPHEN 5-325 MG PO TABS
1.0000 | ORAL_TABLET | ORAL | Status: DC | PRN
  Administered 2017-01-22 – 2017-01-23 (×4): 1 via ORAL
  Filled 2017-01-22 (×4): qty 1

## 2017-01-22 MED ORDER — METHYLENE BLUE 0.5 % INJ SOLN
INTRAVENOUS | Status: AC
  Filled 2017-01-22: qty 10

## 2017-01-22 MED ORDER — ONDANSETRON HCL 4 MG/2ML IJ SOLN
INTRAMUSCULAR | Status: DC | PRN
  Administered 2017-01-22: 4 mg via INTRAVENOUS

## 2017-01-22 MED ORDER — KETOROLAC TROMETHAMINE 30 MG/ML IJ SOLN
30.0000 mg | Freq: Four times a day (QID) | INTRAMUSCULAR | Status: DC
  Administered 2017-01-22 – 2017-01-23 (×5): 30 mg via INTRAVENOUS
  Filled 2017-01-22 (×5): qty 1

## 2017-01-22 MED ORDER — BUPIVACAINE HCL (PF) 0.5 % IJ SOLN
INTRAMUSCULAR | Status: DC | PRN
  Administered 2017-01-22: 8 mL

## 2017-01-22 MED ORDER — LIDOCAINE HCL (PF) 2 % IJ SOLN
INTRAMUSCULAR | Status: AC
  Filled 2017-01-22: qty 2

## 2017-01-22 MED ORDER — ONDANSETRON HCL 4 MG/2ML IJ SOLN: 4.0000 mg | Freq: Once | INTRAMUSCULAR | Status: DC | PRN

## 2017-01-22 MED ORDER — DEXAMETHASONE SODIUM PHOSPHATE 10 MG/ML IJ SOLN
INTRAMUSCULAR | Status: AC
  Filled 2017-01-22: qty 1

## 2017-01-22 SURGICAL SUPPLY — 69 items
APPLICATOR COTTON TIP 6IN STRL (MISCELLANEOUS) ×8 IMPLANT
APPLICATOR SURGIFLO ENDO (HEMOSTASIS) IMPLANT
BAG URO DRAIN 2000ML W/SPOUT (MISCELLANEOUS) ×4 IMPLANT
BLADE SURG 15 STRL LF DISP TIS (BLADE) ×2 IMPLANT
BLADE SURG 15 STRL SS (BLADE) ×2
CANISTER SUCT 1200ML W/VALVE (MISCELLANEOUS) IMPLANT
CANNULA DILATOR  5MM W/SLV (CANNULA)
CANNULA DILATOR 10 W/SLV (CANNULA) IMPLANT
CANNULA DILATOR 10MM W/SLV (CANNULA)
CANNULA DILATOR 5 W/SLV (CANNULA) IMPLANT
CATH FOLEY 2WAY  5CC 16FR (CATHETERS) ×2
CATH URTH 16FR FL 2W BLN LF (CATHETERS) ×2 IMPLANT
CHLORAPREP W/TINT 26ML (MISCELLANEOUS) ×4 IMPLANT
CNTNR SPEC 2.5X3XGRAD LEK (MISCELLANEOUS) ×2
CONT SPEC 4OZ STER OR WHT (MISCELLANEOUS) ×2
CONTAINER SPEC 2.5X3XGRAD LEK (MISCELLANEOUS) ×2 IMPLANT
CORD MONOPOLAR M/FML 12FT (MISCELLANEOUS) ×4 IMPLANT
CUP MEDICINE 2OZ PLAST GRAD ST (MISCELLANEOUS) ×4 IMPLANT
DEFOGGER SCOPE WARMER CLEARIFY (MISCELLANEOUS) ×4 IMPLANT
DERMABOND ADVANCED (GAUZE/BANDAGES/DRESSINGS) ×2
DERMABOND ADVANCED .7 DNX12 (GAUZE/BANDAGES/DRESSINGS) ×2 IMPLANT
DEVICE SUTURE ENDOST 10MM (ENDOMECHANICALS) ×4 IMPLANT
DRAPE LEGGINS SURG 28X43 STRL (DRAPES) IMPLANT
DRAPE STERI POUCH LG 24X46 STR (DRAPES) ×4 IMPLANT
DRSG TEGADERM 2-3/8X2-3/4 SM (GAUZE/BANDAGES/DRESSINGS) IMPLANT
ENDOSTITCH 0 SINGLE 48 (SUTURE) IMPLANT
GAUZE SPONGE NON-WVN 2X2 STRL (MISCELLANEOUS) IMPLANT
GLOVE BIO SURGEON STRL SZ8 (GLOVE) ×32 IMPLANT
GLOVE INDICATOR 8.0 STRL GRN (GLOVE) ×32 IMPLANT
GOWN STRL REUS W/ TWL LRG LVL3 (GOWN DISPOSABLE) ×8 IMPLANT
GOWN STRL REUS W/ TWL XL LVL3 (GOWN DISPOSABLE) ×4 IMPLANT
GOWN STRL REUS W/TWL LRG LVL3 (GOWN DISPOSABLE) ×8
GOWN STRL REUS W/TWL XL LVL3 (GOWN DISPOSABLE) ×4
GRASPER ENDO ROTC 5X36 (INSTRUMENTS) IMPLANT
IRRIGATION STRYKERFLOW (MISCELLANEOUS) IMPLANT
IRRIGATOR STRYKERFLOW (MISCELLANEOUS)
IV LACTATED RINGERS 1000ML (IV SOLUTION) IMPLANT
KIT PINK PAD W/HEAD ARE REST (MISCELLANEOUS) ×4
KIT PINK PAD W/HEAD ARM REST (MISCELLANEOUS) ×2 IMPLANT
LABEL OR SOLS (LABEL) ×4 IMPLANT
LIGASURE BLUNT 5MM 37CM (INSTRUMENTS) ×4 IMPLANT
MANIPULATOR VCARE LG CRV RETR (MISCELLANEOUS) IMPLANT
MANIPULATOR VCARE SML CRV RETR (MISCELLANEOUS) ×4 IMPLANT
MANIPULATOR VCARE STD CRV RETR (MISCELLANEOUS) IMPLANT
NDL INSUFF ACCESS 14 VERSASTEP (NEEDLE) ×4 IMPLANT
NEEDLE HYPO 22GX1.5 SAFETY (NEEDLE) ×4 IMPLANT
NEEDLE SPNL 22GX5 LNG QUINC BK (NEEDLE) ×4 IMPLANT
NS IRRIG 500ML POUR BTL (IV SOLUTION) ×4 IMPLANT
OCCLUDER COLPOPNEUMO (BALLOONS) IMPLANT
PACK GYN LAPAROSCOPIC (MISCELLANEOUS) ×4 IMPLANT
PAD OB MATERNITY 4.3X12.25 (PERSONAL CARE ITEMS) ×4 IMPLANT
PAD PREP 24X41 OB/GYN DISP (PERSONAL CARE ITEMS) ×4 IMPLANT
POUCH ENDO CATCH II 15MM (MISCELLANEOUS) IMPLANT
SET CYSTO W/LG BORE CLAMP LF (SET/KITS/TRAYS/PACK) IMPLANT
SHEARS ENDO 5MM 31CM (CUTTER) ×4 IMPLANT
SPOGE SURGIFLO 8M (HEMOSTASIS)
SPONGE LAP 4X18 5PK (MISCELLANEOUS) IMPLANT
SPONGE SURGIFLO 8M (HEMOSTASIS) IMPLANT
SPONGE VERSALON 2X2 STRL (MISCELLANEOUS)
SUT ENDO VLOC 180-0-8IN (SUTURE) ×8 IMPLANT
SUT VIC AB 2-0 UR6 27 (SUTURE) ×8 IMPLANT
SYR 30ML LL (SYRINGE) ×4 IMPLANT
SYR 3ML LL SCALE MARK (SYRINGE) ×8 IMPLANT
SYR 50ML LL SCALE MARK (SYRINGE) ×4 IMPLANT
SYRINGE 10CC LL (SYRINGE) ×4 IMPLANT
TROCAR BLUNT TIP 12MM OMST12BT (TROCAR) ×4 IMPLANT
TROCAR VERSASTEP PLUS 12MM (TROCAR) ×4 IMPLANT
TROCAR VERSASTEP PLUS 5MM (TROCAR) ×8 IMPLANT
TUBING INSUF HEATED (TUBING) ×4 IMPLANT

## 2017-01-22 NOTE — Anesthesia Post-op Follow-up Note (Signed)
Anesthesia QCDR form completed.        

## 2017-01-22 NOTE — Progress Notes (Signed)
Called with blood pressure of 179/83, one time dose of hydralazine 6m IV written.  No history of hypertension or cardiac disease.  Normotensive preoperatively.

## 2017-01-22 NOTE — H&P (Signed)
Date of Initial H&P: 01/15/17  History reviewed, patient examined, no change in status, stable to proceed with total laparoscopic hysterectomy, bilateral salpingo-oophorectomy, sentinel lymph node mapping and sampling, possible lymph node dissection, possible exploratory laparotomy. - Lovenox and SCD for DVT ppx  Patient opts for definitive surgical management via hysterectomy. The risks of surgery were discussed in detail with the patient including but not limited to: bleeding which may require transfusion or reoperation; infection which may require antibiotics; injury to bowel, bladder, ureters or other surrounding organs (With a literature reported rate of urinary tract injury of 1% quoted); need for additional procedures including laparotomy; thromboembolic phenomenon, incisional problems and other postoperative/anesthesia complications.  Lymph node removal may result lymphedema or lymphocyst.  Patient was also advised that recovery procedure generally involves an overnight stay; and the  expected recovery time after a hysterectomy being in the range of 6-8 weeks.  She is aware that the procedure will render her unable to pursue childbearing in the future.    Routine postoperative instructions will be reviewed with the patient and her family in detail after surgery. Printed patient education handouts about the procedure was given to the patient to review at home.

## 2017-01-22 NOTE — Op Note (Signed)
Operative Note   01/22/2017 5:03 PM  PRE-OP DIAGNOSIS: COMPLEX ATYPICAL HYPERPLASIA OF THE UTERUS    POST-OP DIAGNOSIS: SAME  SURGEON:      Malachy Mood, MD - Primary    Mellody Drown, MD - Assisting  ANESTHESIA: Choice   PROCEDURE:  HYSTERECTOMY TOTAL LAPAROSCOPIC, BSO, SENTINEL LYMPH NODE MAPPING   ESTIMATED BLOOD LOSS: Minimal  DRAINS: NONE   TOTAL IV FLUIDS: per anesthesia  SPECIMENS: uterus, tubes and ovaries.   COMPLICATIONS: none  DISPOSITION: PACU - hemodynamically stable.  CONDITION: stable  INDICATIONS: Atypical endometrial hyperplasia on biopsy   FINDINGS: normal appearing pelvic organs except for some adhesions of uterus to abdominal wall anteriorly and other adhesions on the left gutter and cul de sac.  PROCEDURE IN DETAIL: After informed consent was obtained, the patient was taken to the operating room where anesthesia was obtained without difficulty. The patient was positioned in the dorsal lithotomy position in [place] and her arms were carefully tucked at her sides and the usual precautions were taken.  She was prepped and draped in normal sterile fashion.  Time-out was performed and a Foley catheter was placed into the bladder and the cervix was infiltrated with 4 ml of ICG at 3 an 9 o'clock both superficial and deep injections. A standard VCare uterine manipulator was then placed in the uterus without incident.    An open Hasson technique was used to place an infraumbilical 63-JS baloon trocar under direct visualization. The laparoscope was introduced and CO2 gas was infused for pneumoperitoneum to a pressure of 15 mm Hg.  Right and left lateral 5-mm ports and a 5-12 mm suprapubic port were placed under direct visualization of the laparoscope using an EndoStep technique.  Cytologic washings were obtained.  The patient was placed in Trendelenburg and the bowel was displaced up into the upper abdomen.  Round ligaments were divided on each side  with the EndoShears and the retroperitoneal space was opened bilaterally.  The ureters were identified and preserved.  At this point the retroperitoneal spaces were developed and the lymphatic channels were evaluated, but the sentinel nodes did not map well on either side. The infundibulopelvic ligaments were skeletonized, sealed and divided with the LigaSure device.  A bladder flap was created and the bladder was dissected down off the lower uterine segment and cervix using endoshears and electrocautery.  The uterine arteries were skeletonized bilaterally, sealed and divided with the LigaSure device.  A colpotomy was performed circumferentially along the V-Care ring with electrocautery and the cervix was incised from the vagina and the specimen was removed through the vagina.  A pneumo balloon was placed in the vagina and the vaginal cuff was then closed in a running continuous fashion using the EndoStitch technique with 0 V-Lock suture with careful attention to include the vaginal cuff angles and the vaginal mucosa within the closure.  Intraoperative pathologic evaluation revealed no evidence of cancer, EIN only and therefore further dissection was not performed.  Hemostasis was observed. The intraperitoneal pressure was dropped, and all planes of dissection, vascular pedicles and the vaginal cuff were found to be hemostatic.  The suprapubic trocar was removed and the fascia was closed with 0 Vicryl suture using the Endoclose technique. The lateral trocars were removed under visualization.   Before the umbilical trocar was removed the CO2 gas was released.  The fascia there was closed with 2-0 Vicryl suture in interrupted technique.  The skin incision at the umbilicus was closed with a subcuticular  stitch.  The remaining skin incisions were all closed and then glued.  The patient tolerated the procedure well.  Sponge, lap and needle counts were correct x2.  The patient was taken to recovery room in excellent  condition.  Antibiotics: were administered prior to incision  VTE prophylaxis: was ordered perioperatively.  Mellody Drown, MD

## 2017-01-22 NOTE — Progress Notes (Signed)
Discussed results of frozen section and discharge criteria for tomorrow.  Will change fluid to plain LR given rising BG readings.  Continue home lantus with sliding scale coverage

## 2017-01-22 NOTE — Progress Notes (Signed)
Dr Georgianne Fick notified of hypertension, reduce infusion rate and he will order a one time IV push anti hypertensive

## 2017-01-22 NOTE — H&P (Signed)
I met the patient and reviewed her medical history and plans for TLH/BSO, SLN mapping for EIN, and then if frozen shows cancer we will do SLN biopsies.  She is in agreement with proceeding and there are no contraindications.  Mellody Drown, MD

## 2017-01-22 NOTE — Transfer of Care (Signed)
Immediate Anesthesia Transfer of Care Note  Patient: Kristen Wong  Procedure(s) Performed: Procedure(s): HYSTERECTOMY TOTAL LAPAROSCOPIC BSO (Bilateral)  Patient Location: PACU  Anesthesia Type:General  Level of Consciousness: sedated  Airway & Oxygen Therapy: Patient Spontanous Breathing and Patient connected to face mask oxygen  Post-op Assessment: Report given to RN and Post -op Vital signs reviewed and stable  Post vital signs: Reviewed and stable  Last Vitals:  Vitals:   01/22/17 1020 01/22/17 1358  BP: 136/61 (!) 155/78  Pulse: 79 100  Resp: 18 16  Temp: 36.7 C 36.9 C  SpO2: 53% 61%    Complications: No apparent anesthesia complications

## 2017-01-22 NOTE — Anesthesia Preprocedure Evaluation (Signed)
Anesthesia Evaluation  Patient identified by MRN, date of birth, ID band Patient awake    Reviewed: Allergy & Precautions, NPO status , Patient's Chart, lab work & pertinent test results  Airway Mallampati: II       Dental  (+) Upper Dentures   Pulmonary COPD, Current Smoker,     + decreased breath sounds      Cardiovascular Exercise Tolerance: Good  Rhythm:Regular     Neuro/Psych Depression    GI/Hepatic Neg liver ROS, GERD  ,  Endo/Other  diabetes, Type 1, Insulin Dependent, Oral Hypoglycemic Agents  Renal/GU negative Renal ROS     Musculoskeletal   Abdominal (+) + obese,   Peds negative pediatric ROS (+)  Hematology   Anesthesia Other Findings   Reproductive/Obstetrics                             Anesthesia Physical Anesthesia Plan  ASA: III  Anesthesia Plan: General   Post-op Pain Management:    Induction: Intravenous  PONV Risk Score and Plan: 1 and Ondansetron and Dexamethasone  Airway Management Planned: Oral ETT  Additional Equipment:   Intra-op Plan:   Post-operative Plan: Extubation in OR  Informed Consent: I have reviewed the patients History and Physical, chart, labs and discussed the procedure including the risks, benefits and alternatives for the proposed anesthesia with the patient or authorized representative who has indicated his/her understanding and acceptance.     Plan Discussed with: CRNA  Anesthesia Plan Comments:         Anesthesia Quick Evaluation

## 2017-01-22 NOTE — Anesthesia Procedure Notes (Signed)
Procedure Name: Intubation Date/Time: 01/22/2017 11:34 AM Performed by: Doreen Salvage Pre-anesthesia Checklist: Patient identified, Emergency Drugs available, Suction available and Patient being monitored Patient Re-evaluated:Patient Re-evaluated prior to induction Oxygen Delivery Method: Circle system utilized Preoxygenation: Pre-oxygenation with 100% oxygen Induction Type: IV induction Ventilation: Mask ventilation without difficulty Laryngoscope Size: Mac and 3 Grade View: Grade I Tube type: Oral Tube size: 7.0 mm Number of attempts: 1 Airway Equipment and Method: Stylet Placement Confirmation: ETT inserted through vocal cords under direct vision,  positive ETCO2 and breath sounds checked- equal and bilateral Secured at: 22 cm Tube secured with: Tape Dental Injury: Teeth and Oropharynx as per pre-operative assessment

## 2017-01-23 DIAGNOSIS — N736 Female pelvic peritoneal adhesions (postinfective): Secondary | ICD-10-CM | POA: Diagnosis not present

## 2017-01-23 DIAGNOSIS — N8502 Endometrial intraepithelial neoplasia [EIN]: Secondary | ICD-10-CM | POA: Diagnosis not present

## 2017-01-23 DIAGNOSIS — F17200 Nicotine dependence, unspecified, uncomplicated: Secondary | ICD-10-CM | POA: Diagnosis not present

## 2017-01-23 DIAGNOSIS — N838 Other noninflammatory disorders of ovary, fallopian tube and broad ligament: Secondary | ICD-10-CM | POA: Diagnosis not present

## 2017-01-23 DIAGNOSIS — D251 Intramural leiomyoma of uterus: Secondary | ICD-10-CM | POA: Diagnosis not present

## 2017-01-23 DIAGNOSIS — N83201 Unspecified ovarian cyst, right side: Secondary | ICD-10-CM | POA: Diagnosis not present

## 2017-01-23 DIAGNOSIS — N83202 Unspecified ovarian cyst, left side: Secondary | ICD-10-CM | POA: Diagnosis not present

## 2017-01-23 DIAGNOSIS — Z79899 Other long term (current) drug therapy: Secondary | ICD-10-CM | POA: Diagnosis not present

## 2017-01-23 DIAGNOSIS — E109 Type 1 diabetes mellitus without complications: Secondary | ICD-10-CM | POA: Diagnosis not present

## 2017-01-23 DIAGNOSIS — N950 Postmenopausal bleeding: Secondary | ICD-10-CM | POA: Diagnosis not present

## 2017-01-23 DIAGNOSIS — B373 Candidiasis of vulva and vagina: Secondary | ICD-10-CM | POA: Diagnosis not present

## 2017-01-23 DIAGNOSIS — Z794 Long term (current) use of insulin: Secondary | ICD-10-CM | POA: Diagnosis not present

## 2017-01-23 LAB — BASIC METABOLIC PANEL
Anion gap: 7 (ref 5–15)
BUN: 10 mg/dL (ref 6–20)
CO2: 25 mmol/L (ref 22–32)
CREATININE: 0.45 mg/dL (ref 0.44–1.00)
Calcium: 8.5 mg/dL — ABNORMAL LOW (ref 8.9–10.3)
Chloride: 107 mmol/L (ref 101–111)
GFR calc Af Amer: >60 mL/min (ref >60)
GLUCOSE: 161 mg/dL — AB (ref 65–99)
Potassium: 3.8 mmol/L (ref 3.5–5.1)
SODIUM: 139 mmol/L (ref 135–145)

## 2017-01-23 LAB — MISC LABCORP TEST (SEND OUT): LABCORP TEST CODE: 1453

## 2017-01-23 LAB — CBC
HCT: 46 % (ref 35.0–47.0)
Hemoglobin: 15.5 g/dL (ref 12.0–16.0)
MCH: 29.8 pg (ref 26.0–34.0)
MCHC: 33.7 g/dL (ref 32.0–36.0)
MCV: 88.2 fL (ref 80.0–100.0)
PLATELETS: 346 10*3/uL (ref 150–440)
RBC: 5.22 MIL/uL — ABNORMAL HIGH (ref 3.80–5.20)
RDW: 14.3 % (ref 11.5–14.5)
WBC: 17.1 10*3/uL — ABNORMAL HIGH (ref 3.6–11.0)

## 2017-01-23 LAB — GLUCOSE, CAPILLARY: GLUCOSE-CAPILLARY: 160 mg/dL — AB (ref 65–99)

## 2017-01-23 MED ORDER — INSULIN ASPART 100 UNIT/ML ~~LOC~~ SOLN: 0.0000 [IU] | Freq: Three times a day (TID) | SUBCUTANEOUS | 11 refills | Status: DC

## 2017-01-23 MED ORDER — OXYCODONE-ACETAMINOPHEN 5-325 MG PO TABS: 1.0000 | ORAL_TABLET | ORAL | 0 refills | Status: DC | PRN

## 2017-01-23 MED ORDER — IBUPROFEN 600 MG PO TABS: 600.0000 mg | ORAL_TABLET | Freq: Four times a day (QID) | ORAL | 1 refills | Status: DC | PRN

## 2017-01-23 MED ORDER — INSULIN ASPART 100 UNIT/ML ~~LOC~~ SOLN: 0.0000 [IU] | Freq: Every day | SUBCUTANEOUS | 11 refills | Status: DC

## 2017-01-23 NOTE — Progress Notes (Signed)
Patient discharged home. Discharge instructions, prescriptions, hygiene kit and follow up appointment given to and reviewed with patient. Patient verbalized understanding. Escorted out via wheelchair by auxiliary.

## 2017-01-23 NOTE — Discharge Summary (Signed)
Physician Discharge Summary  Patient ID: Kristen Wong MRN: [id number] DOB/AGE: 1965/06/29 51 y.o.  Admit date: 01/22/2017 Discharge date: 01/23/2017  Admission Diagnoses: laparoscopic total hysterectomy, BSO  Discharge Diagnoses:  Active Problems:   S/P laparoscopic hysterectomy   Discharged Condition: good  Hospital Course: Patient underwent uncomplicated laparoscopic hysterectomy, bilateral salpingo-oophorectomy for complex endometrial hyperplasia with atypia, no evidence of carcinoma on intraoperative frozen section.  Did well postoperatively other than some postoperative hypertension managed with one time dose of hydralazine 36m IV.  At the time of discharge she was ambulating, reporting good pain control, voiding spontaneously, afebrile, and hemodynamically stable.  Consults: None  Significant Diagnostic Studies: Results for orders placed or performed during the hospital encounter of 01/22/17 (from the past 24 hour(s))  Glucose, capillary     Status: None   Collection Time: 01/22/17 10:22 AM  Result Value Ref Range   Glucose-Capillary 95 65 - 99 mg/dL  ABO/Rh     Status: None   Collection Time: 01/22/17 10:53 AM  Result Value Ref Range   ABO/RH(D) O POS   Glucose, capillary     Status: Abnormal   Collection Time: 01/22/17  2:18 PM  Result Value Ref Range   Glucose-Capillary 138 (H) 65 - 99 mg/dL  Glucose, capillary     Status: Abnormal   Collection Time: 01/22/17  3:01 PM  Result Value Ref Range   Glucose-Capillary 156 (H) 65 - 99 mg/dL  Miscellaneous [facility] test (send-out)     Status: None   Collection Time: 01/22/17  3:13 PM  Result Value Ref Range   [facility] test code 1,453    [facility] test name [id number]    Misc [facility] result COMMENT   Glucose, capillary     Status: Abnormal   Collection Time: 01/22/17  4:58 PM  Result Value Ref Range   Glucose-Capillary 200 (H) 65 - 99 mg/dL  Glucose, capillary     Status: Abnormal   Collection Time: 01/22/17  9:15 PM  Result  Value Ref Range   Glucose-Capillary 191 (H) 65 - 99 mg/dL  CBC     Status: Abnormal   Collection Time: 01/23/17  4:16 AM  Result Value Ref Range   WBC 17.1 (H) 3.6 - 11.0 K/uL   RBC 5.22 (H) 3.80 - 5.20 MIL/uL   Hemoglobin 15.5 12.0 - 16.0 g/dL   HCT 46.0 35.0 - 47.0 %   MCV 88.2 80.0 - 100.0 fL   MCH 29.8 26.0 - 34.0 pg   MCHC 33.7 32.0 - 36.0 g/dL   RDW 14.3 11.5 - 14.5 %   Platelets 346 150 - 440 K/uL  Basic metabolic panel     Status: Abnormal   Collection Time: 01/23/17  4:16 AM  Result Value Ref Range   Sodium 139 135 - 145 mmol/L   Potassium 3.8 3.5 - 5.1 mmol/L   Chloride 107 101 - 111 mmol/L   CO2 25 22 - 32 mmol/L   Glucose, Bld 161 (H) 65 - 99 mg/dL   BUN 10 6 - 20 mg/dL   Creatinine, Ser 0.45 0.44 - 1.00 mg/dL   Calcium 8.5 (L) 8.9 - 10.3 mg/dL   GFR calc non Af Amer >60 >60 mL/min   GFR calc Af Amer >60 >60 mL/min   Anion gap 7 5 - 15     Treatments: surgery: laparoscopic hysterectomy, bilateral salpingo-oophorectomy 01/22/17  Discharge Exam: Blood pressure 114/63, pulse 64, temperature 97.9 F (36.6 C), temperature source Oral, resp. rate 18, height 5'  4" (1.626 m), weight 223 lb 1.7 oz (101.2 kg), SpO2 95 %. General appearance: alert, appears stated age and no distress Resp: clear to auscultation bilaterally Cardio: regular rate and rhythm, S1, S2 normal, no murmur, click, rub or gallop GI: soft, non-tender; bowel sounds normal; no masses,  no organomegaly Extremities: extremities normal, atraumatic, no cyanosis or edema  Disposition: 01-Home or Self Care  Discharge Instructions    Activity as tolerated    Complete by:  As directed    Call MD for:    Complete by:  As directed    For heavy vaginal bleeding greater than 1 pad an hour   Call MD for:  difficulty breathing, headache or visual disturbances    Complete by:  As directed    Call MD for:  extreme fatigue    Complete by:  As directed    Call MD for:  hives    Complete by:  As directed     Call MD for:  persistant dizziness or light-headedness    Complete by:  As directed    Call MD for:  persistant nausea and vomiting    Complete by:  As directed    Call MD for:  severe uncontrolled pain    Complete by:  As directed    Call MD for:  temperature >100.4    Complete by:  As directed    Diet general    Complete by:  As directed    Sexual acrtivity    Complete by:  As directed    No intercourse, tampons, or anything vaginally for 6 weeks     Allergies as of 01/23/2017      Reactions   Penicillin G Nausea Only   Has patient had a PCN reaction causing immediate rash, facial/tongue/throat swelling, SOB or lightheadedness with hypotension: No Has patient had a PCN reaction causing severe rash involving mucus membranes or skin necrosis: No Has patient had a PCN reaction that required hospitalization: No Has patient had a PCN reaction occurring within the last 10 years: No If all of the above answers are "NO", then may proceed with Cephalosporin use.      Medication List    TAKE these medications   cetirizine 10 MG tablet Commonly known as:  ZYRTEC Take 20 mg by mouth daily.   FIFTY50 GLUCOSE METER 2.0 w/Device Kit Use as directed.   FIFTY50 PEN NEEDLES 32G X 6 MM Misc Generic drug:  Insulin Pen Needle Use as directed.   fluticasone 50 MCG/ACT nasal spray Commonly known as:  FLONASE Place 2 sprays into both nostrils daily as needed for allergies or rhinitis.   ibuprofen 600 MG tablet Commonly known as:  ADVIL,MOTRIN Take 1 tablet (600 mg total) by mouth every 6 (six) hours as needed for headache, moderate pain or cramping. What changed:  medication strength  how much to take  reasons to take this   insulin glargine 100 unit/mL Sopn Commonly known as:  LANTUS Inject 20 Units into the skin at bedtime.   metFORMIN 500 MG tablet Commonly known as:  GLUCOPHAGE Take 1 tablet (500 mg total) by mouth 2 (two) times daily with a meal.   nystatin cream Commonly  known as:  MYCOSTATIN Apply 1 application topically 2 (two) times daily. What changed:  when to take this  reasons to take this   oxyCODONE-acetaminophen 5-325 MG tablet Commonly known as:  PERCOCET/ROXICET Take 1-2 tablets by mouth every 4 (four) hours as needed (moderate to severe pain (  when tolerating fluids)).   terconazole 0.8 % vaginal cream Commonly known as:  TERAZOL 3 Place 1 applicator vaginally at bedtime. What changed:  when to take this  reasons to take this      [place] INADVERTENTLY BEEN SENT TO YOUR [facility]  Signed: Malachy Mood 01/23/2017, 7:54 AM

## 2017-01-24 ENCOUNTER — Encounter: Payer: Self-pay | Admitting: *Deleted

## 2017-01-24 LAB — JAK2 EXONS 12-15

## 2017-01-24 LAB — SURGICAL PATHOLOGY

## 2017-01-24 LAB — CYTOLOGY - NON PAP

## 2017-01-28 NOTE — Anesthesia Postprocedure Evaluation (Signed)
Anesthesia Post Note  Patient: Kristen Wong  Procedure(s) Performed: Procedure(s) (LRB): HYSTERECTOMY TOTAL LAPAROSCOPIC BSO (Bilateral)  Patient location during evaluation: PACU Anesthesia Type: General Level of consciousness: awake and alert Pain management: pain level controlled Vital Signs Assessment: post-procedure vital signs reviewed and stable Respiratory status: spontaneous breathing, nonlabored ventilation, respiratory function stable and patient connected to nasal cannula oxygen Cardiovascular status: blood pressure returned to baseline and stable Postop Assessment: no signs of nausea or vomiting Anesthetic complications: no     Last Vitals:  Vitals:   01/23/17 0725 01/23/17 1128  BP: 114/63 (!) 141/63  Pulse: 64 68  Resp: 18 20  Temp: 36.6 C 37.1 C  SpO2: 95% 96%    Last Pain:  Vitals:   01/23/17 1130  TempSrc:   PainSc: 2                  [name]

## 2017-01-30 ENCOUNTER — Telehealth: Payer: Self-pay

## 2017-01-31 ENCOUNTER — Ambulatory Visit (INDEPENDENT_AMBULATORY_CARE_PROVIDER_SITE_OTHER): Payer: BLUE CROSS/BLUE SHIELD | Admitting: Obstetrics and Gynecology

## 2017-01-31 ENCOUNTER — Encounter: Payer: Self-pay | Admitting: Obstetrics and Gynecology

## 2017-01-31 VITALS — BP 144/74 | HR 101 | Wt 218.0 lb

## 2017-01-31 DIAGNOSIS — Z09 Encounter for follow-up examination after completed treatment for conditions other than malignant neoplasm: Secondary | ICD-10-CM

## 2017-01-31 NOTE — Telephone Encounter (Signed)
FMLA/DISABILITY for for [facility] filled out and given to [place] for processing.

## 2017-02-01 NOTE — Progress Notes (Signed)
      Postoperative Follow-up Patient presents post op from laparoscopic total hysterectomy, bilateral salpingo-oophorectomy 1weeks ago for complex hyperplasia with atypia (confirmed on final pathology).  Subjective: Patient reports marked improvement in her preop symptoms. Eating a regular diet without difficulty. Pain is controlled without any medications.  Activity: normal activities of daily living.  Objective: Vitals:   01/31/17 1623  BP: (!) 144/74  Pulse: (!) 101   Gen: NAD HEENT: normocephalic, anicteric Pulmonary: no increased work of breathing Abdomen: soft, non-tender, non-distended, trocar sites D/C/I Ext: no edema  Assessment: 51 y.o. s/p TLH, BSO stable  Plan: Patient has done well after surgery with no apparent complications.  I have discussed the post-operative course to date, and the expected progress moving forward.  The patient understands what complications to be concerned about.  I will see the patient in routine follow up, or sooner if needed.    Activity plan: No heavy lifting.  Malachy Mood 02/01/2017, 12:45 AM

## 2017-02-03 ENCOUNTER — Encounter: Payer: Self-pay | Admitting: Obstetrics and Gynecology

## 2017-02-05 ENCOUNTER — Inpatient Hospital Stay: Payer: BLUE CROSS/BLUE SHIELD | Admitting: Oncology

## 2017-02-14 ENCOUNTER — Inpatient Hospital Stay: Payer: BLUE CROSS/BLUE SHIELD | Attending: Oncology | Admitting: Oncology

## 2017-02-14 VITALS — BP 137/81 | HR 102 | Temp 97.2°F | Resp 20 | Wt 215.4 lb

## 2017-02-14 DIAGNOSIS — Z7982 Long term (current) use of aspirin: Secondary | ICD-10-CM | POA: Insufficient documentation

## 2017-02-14 DIAGNOSIS — Z794 Long term (current) use of insulin: Secondary | ICD-10-CM | POA: Diagnosis not present

## 2017-02-14 DIAGNOSIS — Z0000 Encounter for general adult medical examination without abnormal findings: Secondary | ICD-10-CM

## 2017-02-14 DIAGNOSIS — E119 Type 2 diabetes mellitus without complications: Secondary | ICD-10-CM | POA: Diagnosis not present

## 2017-02-14 DIAGNOSIS — K219 Gastro-esophageal reflux disease without esophagitis: Secondary | ICD-10-CM | POA: Diagnosis not present

## 2017-02-14 DIAGNOSIS — Z79899 Other long term (current) drug therapy: Secondary | ICD-10-CM | POA: Diagnosis not present

## 2017-02-14 DIAGNOSIS — D751 Secondary polycythemia: Secondary | ICD-10-CM | POA: Insufficient documentation

## 2017-02-14 DIAGNOSIS — F17210 Nicotine dependence, cigarettes, uncomplicated: Secondary | ICD-10-CM | POA: Diagnosis not present

## 2017-02-14 DIAGNOSIS — K760 Fatty (change of) liver, not elsewhere classified: Secondary | ICD-10-CM | POA: Diagnosis not present

## 2017-02-14 DIAGNOSIS — F329 Major depressive disorder, single episode, unspecified: Secondary | ICD-10-CM | POA: Insufficient documentation

## 2017-02-14 DIAGNOSIS — R634 Abnormal weight loss: Secondary | ICD-10-CM | POA: Diagnosis not present

## 2017-02-14 DIAGNOSIS — I700 Atherosclerosis of aorta: Secondary | ICD-10-CM | POA: Insufficient documentation

## 2017-02-14 DIAGNOSIS — K8020 Calculus of gallbladder without cholecystitis without obstruction: Secondary | ICD-10-CM | POA: Diagnosis not present

## 2017-02-14 MED ORDER — NICOTINE 21 MG/24HR TD PT24: 21.0000 mg | MEDICATED_PATCH | Freq: Every day | TRANSDERMAL | 0 refills | Status: DC

## 2017-02-14 NOTE — Progress Notes (Signed)
Patient denies any concerns today.  

## 2017-02-14 NOTE — Progress Notes (Signed)
[facility] follow up visit  Patient Care Team: Glendon Axe, MD as PCP - General (Internal Medicine)  CHIEF COMPLAINTS/PURPOSE OF CONSULTATION: I have high blood count.   HISTORY OF PRESENTING ILLNESS: Kristen Wong 51 y.o. female with PMH listed as below was referred by he primary care provider Dr.Singh for evaluation of high hemoglobin.  Patient is accompanied by her daughter and sister to the clinic visit. She recently has D&C for evaluation of postmenopausal bleeding. Pathology showed endometrium hyperplasia, but a well differentiated neoplasm can not be excluded. She was seen by GynOnc Dr.Secord today and she will get a hysterotomy next week. She also reports recently newly diagnosed DM for which she was started on Metformin and she is having some GI side effects and her appetite is decreased. She has had unintentional weight loss as well.  Patient otherwise feeling well. She smokes 1 pack of cigarettes/day for about 35 years. She lives at home with daughter.  Denies fatigue, fever/chills, chest pain, SOB, headache, vision changes, abdominal pain, previous clots.   INTERVAL HISTORY Patient presents to discuss about the results. During the interval she has had uncomplicated laparoscopic hysterectomy, bilateral salpingo-oophorectomy for complex endometrial hyperplasia with atypia. Pathology showed EIN.  Otherwise no complaints. Still smoking.   Review of Systems  Constitutional: Positive for unexpected weight change.  HENT:  Negative.   Eyes: Negative.   Respiratory: Negative.   Cardiovascular: Negative.   Endocrine: Negative.   Genitourinary: Negative.    Musculoskeletal: Negative.   Skin: Negative.   Neurological: Negative.   Hematological: Negative.   Psychiatric/Behavioral: Negative.     MEDICAL HISTORY: Past Medical History:  Diagnosis Date  . Arthritis    KNEES  . Blood dyscrasia    POLYCYTHEMIA  . Depression    H/O  . Diabetes mellitus  without complication ([place]) 8101   Type 2-NEWLY DX  . Fatty liver   . GERD (gastroesophageal reflux disease)    OCC  . Heart murmur    ASYMPTOMATIC    SURGICAL HISTORY: Past Surgical History:  Procedure Laterality Date  . CESAREAN SECTION    . KNEE SURGERY     [facility]  . LAPAROSCOPIC HYSTERECTOMY Bilateral 01/22/2017   Procedure: HYSTERECTOMY TOTAL LAPAROSCOPIC BSO;  Surgeon: Malachy Mood, MD;  Location: [facility] ORS;  Service: Gynecology;  Laterality: Bilateral;    SOCIAL HISTORY: Social History   Social History  . Marital status: Divorced    Spouse name: N/A  . Number of children: N/A  . Years of education: N/A   Occupational History  . Not on file.   Social History Main Topics  . Smoking status: Current Every Day Smoker    Packs/day: 1.00    Years: 30.00    Types: Cigarettes  . Smokeless tobacco: Never Used  . Alcohol use No  . Drug use: No  . Sexual activity: Not Currently   Other Topics Concern  . Not on file   Social History Narrative  . No narrative on file    FAMILY HISTORY Family History  Problem Relation Age of Onset  . Diabetes Mother   . Diabetes Father   . Diabetes Sister     ALLERGIES:  is allergic to penicillin g.  MEDICATIONS:  Current Outpatient Prescriptions  Medication Sig Dispense Refill  . Blood Glucose Monitoring Suppl (FIFTY50 GLUCOSE METER 2.0) w/Device KIT Use as directed.    . cetirizine (ZYRTEC) 10 MG tablet Take 20 mg by mouth daily.    . fluticasone (  FLONASE) 50 MCG/ACT nasal spray Place 2 sprays into both nostrils daily as needed for allergies or rhinitis.    Marland Kitchen ibuprofen (ADVIL,MOTRIN) 600 MG tablet Take 1 tablet (600 mg total) by mouth every 6 (six) hours as needed for headache, moderate pain or cramping. 60 tablet 1  . insulin glargine (LANTUS) 100 unit/mL SOPN Inject 20 Units into the skin at bedtime.    . Insulin Pen Needle (FIFTY50 PEN NEEDLES) 32G X 6 MM MISC Use as directed.    . metFORMIN (GLUCOPHAGE) 500 MG tablet  Take 1 tablet (500 mg total) by mouth 2 (two) times daily with a meal. 60 tablet 0  . nystatin cream (MYCOSTATIN) Apply 1 application topically 2 (two) times daily. (Patient taking differently: Apply 1 application topically daily as needed (irritation). ) 30 g 1  . oxyCODONE-acetaminophen (PERCOCET/ROXICET) 5-325 MG tablet Take 1-2 tablets by mouth every 4 (four) hours as needed (moderate to severe pain (when tolerating fluids)). 40 tablet 0  . terconazole (TERAZOL 3) 0.8 % vaginal cream Place 1 applicator vaginally at bedtime. (Patient taking differently: Place 1 applicator vaginally at bedtime as needed (itching). ) 20 g 1   No current facility-administered medications for this visit.     PHYSICAL EXAMINATION:  ECOG PERFORMANCE STATUS: 0 - Asymptomatic   Vitals:   02/14/17 1212  BP: 137/81  Pulse: (!) 102  Resp: 20  Temp: (!) 97.2 F (36.2 C)    Filed Weights   02/14/17 1212  Weight: 215 lb 6.4 oz (97.7 kg)    Physical Exam GENERAL: No distress, well nourished. Obese.  SKIN:  No rashes or significant lesions  HEAD: Normocephalic, No masses, lesions, tenderness or abnormalities  EYES: Conjunctiva are pink, non icteric ENT: External ears normal ,lips , buccal mucosa, and tongue normal and mucous membranes are moist  LYMPH: No palpable cervical and axillary lymphadenopathy  LUNGS: Clear to auscultation, no crackles or wheezes HEART: Regular rate & rhythm, no murmurs, no gallops, S1 normal and S2 normal  ABDOMEN: Abdomen soft, non-tender, normal bowel sounds, I did not appreciate any  masses or organomegaly.  MUSCULOSKELETAL: No CVA tenderness and no tenderness on percussion of the back or rib cage.  EXTREMITIES: No edema, no skin discoloration or tenderness NEURO: Alert & oriented, no focal motor/sensory deficits.   LABORATORY DATA: I have personally reviewed the data as listed: CBC Latest Ref Rng & Units 01/23/2017 01/15/2017 12/11/2016  WBC 3.6 - 11.0 K/uL 17.1(H) 10.4 9.8   Hemoglobin 12.0 - 16.0 g/dL 15.5 16.6(H) 17.4(H)  Hematocrit 35.0 - 47.0 % 46.0 47.4(H) 51.0(H)  Platelets 150 - 440 K/uL 346 329 255   CMP Latest Ref Rng & Units 01/23/2017 01/22/2017 12/11/2016  Glucose 65 - 99 mg/dL 161(H) 101(H) 470(H)  BUN 6 - 20 mg/dL 10 10 6   Creatinine 0.44 - 1.00 mg/dL 0.45 0.53 0.64  Sodium 135 - 145 mmol/L 139 140 133(L)  Potassium 3.5 - 5.1 mmol/L 3.8 4.1 3.7  Chloride 101 - 111 mmol/L 107 105 98(L)  CO2 22 - 32 mmol/L 25 23 26   Calcium 8.9 - 10.3 mg/dL 8.5(L) 9.2 9.2  Total Protein 6.5 - 8.1 g/dL - - 8.0  Total Bilirubin 0.3 - 1.2 mg/dL - - 0.8  Alkaline Phos 38 - 126 U/L - - 143(H)  AST 15 - 41 U/L - - 283(H)  ALT 14 - 54 U/L - - 244(H)    RADIOGRAPHIC STUDIES: I have personally reviewed the radiological images as listed and agree with  the findings in the report 12/11/2016 CT abdomen pelvis w/o contrast IMPRESSION: 1. No acute abnormality of the abdomen or pelvis. 2. Hepatic steatosis and cholelithiasis without acute inflammation. 3.  Aortic Atherosclerosis (ICD10-I70.0).   ASSESSMENT/PLAN 1. Polycythemia, secondary    # Lab results were discussed with patient and her family members. She has polycythemia, likely secondary.  Likely secondary to her heavy smoking.  #  Epo level at high normal limit, elevated carboxyhemoglobin level, negative JAK2 exon 12-15  mutation analysis  # I discussed in detail about smoke cessation, she is willing to consider.  I recommend her to start low dose Aspirin 47m.  # Her weight loss maybe secondary to her recent start of metformin.  # Discuss about the need of a colonoscopy and annual mammogram. Will obtain mammogram. She already has appointment with GI .  See in 2 months for follow up w cbc  Orders Placed This Encounter  Procedures  . MM DIGITAL SCREENING BILATERAL    Standing Status:   Future    Standing Expiration Date:   04/16/2018    Order Specific Question:   Reason for Exam (SYMPTOM  OR DIAGNOSIS  REQUIRED)    Answer:   annual exam    Order Specific Question:   Is the patient pregnant?    Answer:   No    Order Specific Question:   Preferred imaging location?    Answer:   [facility]  . CBC with Differential/Platelet    Standing Status:   Future    Standing Expiration Date:   02/14/2018    All questions were answered. The patient knows to call the clinic with any problems, questions or concerns. Total face to face encounter time for this patient visit was 25 min. >50% of the time was  spent in counseling and coordination of care.    ZEarlie Server MD  02/14/2017 11:54 AM

## 2017-02-15 ENCOUNTER — Encounter: Payer: Self-pay | Admitting: Oncology

## 2017-02-18 ENCOUNTER — Telehealth: Payer: Self-pay

## 2017-02-18 NOTE — Telephone Encounter (Signed)
Pt calling triage, states she needs a note to return to work 03/05/2017. PO appt is not until October so I told pt I would send this to AMS and his nurse to evaluate.

## 2017-02-18 NOTE — Telephone Encounter (Signed)
Please advise 

## 2017-02-19 ENCOUNTER — Encounter: Payer: Self-pay | Admitting: Obstetrics and Gynecology

## 2017-02-19 NOTE — Telephone Encounter (Signed)
There is a letter in the chart from today

## 2017-02-19 NOTE — Telephone Encounter (Signed)
Pt returning call about note for work.

## 2017-02-20 NOTE — Telephone Encounter (Signed)
Pt aware it is ready for pick up

## 2017-02-26 ENCOUNTER — Inpatient Hospital Stay: Payer: BLUE CROSS/BLUE SHIELD | Admitting: Obstetrics and Gynecology

## 2017-02-26 VITALS — BP 121/81 | HR 105 | Temp 98.2°F | Resp 18 | Ht 64.0 in | Wt 216.3 lb

## 2017-02-26 DIAGNOSIS — R21 Rash and other nonspecific skin eruption: Secondary | ICD-10-CM

## 2017-02-26 DIAGNOSIS — N8500 Endometrial hyperplasia, unspecified: Secondary | ICD-10-CM

## 2017-02-26 MED ORDER — NYSTATIN 100000 UNIT/GM EX POWD: Freq: Two times a day (BID) | CUTANEOUS | 0 refills | Status: DC

## 2017-02-26 NOTE — Progress Notes (Signed)
Pt states she has rash under her breasts and in her groin. No gyn problems.  Recheck of pulse 103. Pt is nervous and her left knee is shaking up and down because she has another appt to go to this am and does not want to be late.

## 2017-02-26 NOTE — Progress Notes (Signed)
Gynecologic Oncology Postoperative Visit   Referring Provider: Dr. Malachy Mood  Chief Concern: Postoperative Visit  Subjective:  Kristen Wong is a 51 y.o. G1P1 female who is seen in consultation from Dr. 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   She presents today for her postoperative visit. She underwent a TLH, BSO, and SLN injection with mapping on 01/22/2017.    Pathology: DIAGNOSIS:  A. 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  - ADENOMYOSIS.  - INTRAMURAL LEIOMYOMAS.  - SEROSAL ADHESIONS.   BILATERAL FALLOPIAN TUBES AND OVARIES; SALPINGO-OOPHORECTOMY:  - OVARIAN STROMAL HYPERPLASIA AND CORTICAL INCLUSION CYSTS.  - UNREMARKABLE FALLOPIAN TUBES.   Cytology: DIAGNOSIS:  A. PELVIC WASHINGS:  - NEGATIVE FOR MALIGNANCY.  - MESOTHELIAL CELLS AND A FEW LEUKOCYTES.    She has numerous medical complaints as well as concerns about skin rash under breasts and in groin areas. She is having her mammogram tomorrow.   Gynecologic Oncology History  Kristen Wong is a pleasant G51P1 female who is seen in consultation from Dr. 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 She presented to Dr. Georgianne Fick for evaluation of perimenopausal bleeding. She has had hot flashes for years and abnormal bleeding. She has not had a normal period since her 45's. Evaluation included the following:  Pelvic ultrasound 12/11/16 norma except for endometrial stripe thickened at 36m. Uterus Measurements: 5 x 2 x 3 cm. Sub endometrial cystic structure implying adenomyosis. Bilateral ovaries could not be visualized. No abnormal free fluid.  CT scan A/P: negative for adenopathy or metastatic disease. She hepatic steatosis and cholelithiasis without acute inflammation and aortic atherosclerosis based on calcifications.   Consult with Dr. SGeorgianne Fick7/18/2018. He noted she  does not have a history of abnormal pap smears.  Her prior last pap smear was>20 years ago years ago and was read as no abnormalities.  Pap  which was NILM. Endometrial biopsy uterus sounded to 7 cm and pathology "COMPLEX HYPERPLASIA WITH ATYPIA CAN NOT EXCLUDE WELL DIFFERENTIATED ENDOMETRIAL ADENOCARCINOMA."   She presents today for evaluation. She also has an appointment with Dr. YTasia Catchingsfor evaluation of polycythemia with a Hb 17.4 and HCT 51.0. She also has elevated LFTs in the 200's and saw Dr. ETiffany Kocheron 01/09/2017. She had a complete evaluation including the following:  - Comprehensive Metabolic Panel (CMP) - repeat AST 63 and ALT 93.  - Prothrombin Time (INR) - Iron Panel - Fer[facility]in - HBV/HCV (Profile VIII) - [facility] - Hep A Ab, Total - [facility] - AFP, Serum, Tumor Marker - [facility] - Alpha-1-Antitrypsin Phenotyp - [facility] - Mitochondrial (M2) Antibody - [facility] - Antinuclear Antibodies, IFA - [facility] - Actin (Smooth Muscle) Antibody - [facility] - Ceruloplasmin - [facility]  Workup negative except for positive Hep A total Ab positive. The overall findings were consistent with an acute event (i.e. Viral infection, medication/supplement induced, etc) that created the acutely elevated LFTs. She likely has a degree of fatty liver per CT findings, and lifestyle modifications were recommended as well as Hepatitis B vaccination as she is not immune.  We recommended surgery for EIN.      Problem List: Patient Active Problem List   Diagnosis Date Noted  . Endometrial hyperplasia 02/26/2017  . S/P laparoscopic hysterectomy 01/22/2017  . Endometrial hyperplasia without atypia, complex 01/15/2017  . Elevated hemoglobin ([place] 01/15/2017  . Class 2 obesity with serious comorbidity and body mass index (BMI) of 38.0 to 38.9 in adult 01/15/2017  .  Type 2 diabetes mellitus without complication, without long-term current use of insulin ([place]) 01/15/2017  . Metabolic syndrome 86/76/7209  . Tobacco abuse  counseling 01/15/2017    Past Medical History: Past Medical History:  Diagnosis Date  . Arthritis    KNEES  . Blood dyscrasia    POLYCYTHEMIA  . Depression    H/O  . Diabetes mellitus without complication ([place]) 4709   Type 2-NEWLY DX  . Fatty liver   . GERD (gastroesophageal reflux disease)    OCC  . Heart murmur    ASYMPTOMATIC    Past Surgical History: Past Surgical History:  Procedure Laterality Date  . CESAREAN SECTION    . KNEE SURGERY     [facility]  . LAPAROSCOPIC HYSTERECTOMY Bilateral 01/22/2017   Procedure: HYSTERECTOMY TOTAL LAPAROSCOPIC BSO;  Surgeon: Malachy Mood, MD;  Location: [facility] ORS;  Service: Gynecology;  Laterality: Bilateral;    Past Gynecologic History:  Menarche: 10 Menstrual details: long history of abnormal vaginal bleeding and cycles.  Menses regular: n/a Last Menstrual Period: unknown History of Abnormal pap: no, benign cellular changes Last pap: see HPI    OB History:  OB History  Gravida Para Term Preterm AB Living  2 1 1   1 1   SAB TAB Ectopic Multiple Live Births          1    # Outcome Date GA Lbr Len/2nd Weight Sex Delivery Anes PTL Lv  2 Term 01/04/89    F CS-LTranv  N LIV  1 AB               Family History: Family History  Problem Relation Age of Onset  . Diabetes Mother   . Diabetes Father   . Diabetes Sister     Social History: Social History   Social History  . Marital status: Divorced    Spouse name: N/A  . Number of children: N/A  . Years of education: N/A   Occupational History  . Not on file.   Social History Main Topics  . Smoking status: Current Every Day Smoker    Packs/day: 1.00    Years: 30.00    Types: Cigarettes  . Smokeless tobacco: Never Used  . Alcohol use No  . Drug use: No  . Sexual activity: Not Currently   Other Topics Concern  . Not on file   Social History Narrative  . No narrative on file    Allergies: Allergies  Allergen Reactions  . Penicillin G Nausea Only    Has  patient had a PCN reaction causing immediate rash, facial/tongue/throat swelling, SOB or lightheadedness with hypotension: No Has patient had a PCN reaction causing severe rash involving mucus membranes or skin necrosis: No Has patient had a PCN reaction that required hospitalization: No Has patient had a PCN reaction occurring within the last 10 years: No If all of the above answers are "NO", then may proceed with Cephalosporin use.     Current Medications: Current Outpatient Prescriptions  Medication Sig Dispense Refill  . Blood Glucose Monitoring Suppl (FIFTY50 GLUCOSE METER 2.0) w/Device KIT Use as directed.    . cetirizine (ZYRTEC) 10 MG tablet Take 20 mg by mouth daily.    . fluticasone (FLONASE) 50 MCG/ACT nasal spray Place 2 sprays into both nostrils daily as needed for allergies or rhinitis.    Marland Kitchen ibuprofen (ADVIL,MOTRIN) 600 MG tablet Take 1 tablet (600 mg total) by mouth every 6 (six) hours as needed for headache, moderate pain or cramping. [place]  tablet 1  . insulin glargine (LANTUS) 100 unit/mL SOPN Inject 20 Units into the skin at bedtime.    . Insulin Pen Needle (FIFTY50 PEN NEEDLES) 32G X 6 MM MISC Use as directed.    . metFORMIN (GLUCOPHAGE) 500 MG tablet Take 1 tablet (500 mg total) by mouth 2 (two) times daily with a meal. 60 tablet 0  . nystatin cream (MYCOSTATIN) Apply 1 application topically 2 (two) times daily. 30 g 1  . nicotine (NICODERM CQ - DOSED IN MG/24 HOURS) 21 mg/24hr patch Place 1 patch (21 mg total) onto the skin daily. (Patient not taking: Reported on 02/26/2017) 28 patch 0  . nystatin (MYCOSTATIN/NYSTOP) powder Apply topically 2 (two) times daily. Until rash resolved. 15 g 0  . oxyCODONE-acetaminophen (PERCOCET/ROXICET) 5-325 MG tablet Take 1-2 tablets by mouth every 4 (four) hours as needed (moderate to severe pain (when tolerating fluids)). (Patient not taking: Reported on 02/14/2017) 40 tablet 0  . terconazole (TERAZOL 3) 0.8 % vaginal cream Place 1 applicator  vaginally at bedtime. (Patient not taking: Reported on 02/14/2017) 20 g 1   No current facility-administered medications for this visit.     Review of Systems: as per interval history        Objective:  Physical Examination:  BP 121/81   Pulse (!) 105   Temp 98.2 F (36.8 C) (Tympanic)   Resp 18   Ht 5' 4"  (1.626 m)   Wt 216 lb 4.8 oz (98.1 kg)   BMI 37.13 kg/m    Repeat pulse 103 - patient in no distress   ECOG Performance Status: 0 - Asymptomatic  General appearance: alert, cooperative and appears stated age HEENT:PERRLA, extra ocular movement intact and sclera clear, anicteric Abdomen: soft, non-tender, without masses or organomegaly, nondistended, no hernias and well healed incisions Extremities: extremities normal, atraumatic, no cyanosis or edema Neurological exam reveals alert, oriented, normal speech, no focal findings or movement disorder noted. Skin: erythematous rash under breast and inguinal folds.   Pelvic: exam chaperoned by nurse;  Vulva: normal appearing vulva with no masses, tenderness or lesions; Vagina: normal vagina, cuff intact with green sutures, no erythema or drainage; Uterus/Cervix: surgically absent. BME negative. RV deferred.     Lab Review Labs on site today:   Chemistry      Component Value Date/Time   NA 139 01/23/2017 0416   K 3.8 01/23/2017 0416   CL 107 01/23/2017 0416   CO2 25 01/23/2017 0416   BUN 10 01/23/2017 0416   CREATININE 0.45 01/23/2017 0416      Component Value Date/Time   CALCIUM 8.5 (L) 01/23/2017 0416   ALKPHOS 143 (H) 12/11/2016 2009   AST 283 (H) 12/11/2016 2009   ALT 244 (H) 12/11/2016 2009   BILITOT 0.8 12/11/2016 2009     Lab Results  Component Value Date   WBC 17.1 (H) 01/23/2017   HGB 15.5 01/23/2017   HCT 46.0 01/23/2017   MCV 88.2 01/23/2017   PLT 346 01/23/2017     Radiologic Imaging: CXR ordered    Assessment:  KRISSA UTKE is a 51 y.o. female diagnosed with complex atypical hyperplasia  s/p TLH/BSO doing well postop. Skin infection.  Medical co-morbidities complicating care: tobacco use, elevated Hb, Hct (work up pending), elevated LFTs most likely due to fatty liver, HTN and immunocompromised.  Plan:   Problem List Items Addressed This Visit      Genitourinary   Endometrial hyperplasia    Other Visit Diagnoses    Rash  and nonspecific skin eruption    -  Primary   Relevant Medications   nystatin (MYCOSTATIN/NYSTOP) powder     Benign disease with postoperative recovery.   Symptomatic skin infection. Will prescribe nystatin powder.  I recommended that she follow up with her PCP about this issues and her other medical concerns.   Tachycardia most likely due to anxiety.   The patient's diagnosis, an outline of the further diagnostic and laboratory studies which will be required, the recommendation, and alternatives were discussed.  All questions were answered to the patient's satisfaction.   Gillis Ends, MD    CC:  Dr. Malachy Mood

## 2017-02-26 NOTE — Patient Instructions (Signed)
Skin Yeast Infection Skin yeast infection is a condition in which there is an overgrowth of yeast (candida) that normally lives on the skin. This condition usually occurs in areas of the skin that are constantly warm and moist, such as the armpits or the groin. What are the causes? This condition is caused by a change in the normal balance of the yeast and bacteria that live on the skin. What increases the risk? This condition is more likely to develop in:  People who are obese.  Pregnant women.  Women who take birth control pills.  People who have diabetes.  People who take antibiotic medicines.  People who take steroid medicines.  People who are malnourished.  People who have a weak defense (immune) system.  People who are 52 years of age or older.  What are the signs or symptoms? Symptoms of this condition include:  A red, swollen area of the skin.  Bumps on the skin.  Itchiness.  How is this diagnosed? This condition is diagnosed with a medical history and physical exam. Your health care provider may check for yeast by taking light scrapings of the skin to be viewed under a microscope. How is this treated? This condition is treated with medicine. Medicines may be prescribed or be available over-the-counter. The medicines may be:  Taken by mouth (orally).  Applied as a cream.  Follow these instructions at home:  Take or apply over-the-counter and prescription medicines only as told by your health care provider.  Eat more yogurt. This may help to keep your yeast infection from returning.  Maintain a healthy weight. If you need help losing weight, talk with your health care provider.  Keep your skin clean and dry.  If you have diabetes, keep your blood sugar under control. Contact a health care provider if:  Your symptoms go away and then return.  Your symptoms do not get better with treatment.  Your symptoms get worse.  Your rash spreads.  You have a  fever or chills.  You have new symptoms.  You have new warmth or redness of your skin. This information is not intended to replace advice given to you by your health care provider. Make sure you discuss any questions you have with your health care provider. Document Released: 02/12/2011 Document Revised: 01/21/2016 Document Reviewed: 11/28/2014 Elsevier Interactive Patient Education  2018 [place].   Nystatin topical powder What is this medicine? NYSTATIN (nye STAT in) is an antifungal medicine. It is used to treat certain kinds of fungal or yeast infections of the skin. This medicine may be used for other purposes; ask your health care provider or pharmacist if you have questions. COMMON BRAND NAME(S): Mycostatin, Nyamyc, Nyata, Nystop, Pedi-Dri What should I tell my health care provider before I take this medicine? They need to know if you have any of these conditions: -an unusual or allergic reaction to nystatin, other foods, dyes or preservatives -pregnant or trying to get pregnant -breast-feeding How should I use this medicine? This medicine is for external use on the skin only. Follow the directions on the prescription label. Dust the powder on the affected area (or into socks and shoes). If you are treating diaper rash, do not use tight-fitting diapers or plastic pants. Do not get the medicine in your eyes. If you do, rinse out with plenty of cool tap water. Do not breathe in the powder. Do not use your medicine more often than directed. Use your doses at regular intervals. Finish the  full course prescribed by your doctor or health care professional even if you think your condition is better. Do not stop using except on the advice of your doctor or health care professional. Talk to your pediatrician regarding the use of this medicine in children. Special care may be needed. Overdosage: If you think you have taken too much of this medicine contact a poison control center or  emergency room at once. NOTE: This medicine is only for you. Do not share this medicine with others. What if I miss a dose? If you miss a dose, use it as soon as you can. If it is almost time for your next dose, use only that dose. Do not use double or extra doses. What may interact with this medicine? Interactions are not expected. Do not use any other skin products on the affected area without telling your doctor or health care professional. This list may not describe all possible interactions. Give your health care provider a list of all the medicines, herbs, non-prescription drugs, or dietary supplements you use. Also tell them if you smoke, drink alcohol, or use illegal drugs. Some items may interact with your medicine. What should I watch for while using this medicine? Tell your doctor or health care professional if your symptoms do not improve after 3 days. After bathing make sure that your skin is very dry. Fungal infections like moist conditions. Do not walk around barefoot. To help prevent reinfection, wear freshly washed cotton, not synthetic, clothing. What side effects may I notice from receiving this medicine? Side effects that you should report to your doctor or health care professional as soon as possible: -allergic reactions like skin rash, itching or hives, swelling of the face, lips, or tongue Side effects that usually do not require medical attention (report to your doctor or health care professional if they continue or are bothersome): -skin irritation This list may not describe all possible side effects. Call your doctor for medical advice about side effects. You may report side effects to FDA at 1-800-FDA-1088. Where should I keep my medicine? Keep out of the reach of children. Store at room temperature between 15 and 30 degrees C (59 and 86 degrees F). Throw away any unused medicine after the expiration date. NOTE: This sheet is a summary. It may not cover all possible  information. If you have questions about this medicine, talk to your doctor, pharmacist, or health care provider.  2018 Elsevier/Gold Standard (2015-06-29 10:36:02)

## 2017-02-27 ENCOUNTER — Ambulatory Visit
Admission: RE | Admit: 2017-02-27 | Discharge: 2017-02-27 | Disposition: A | Discharge status: Home / Self Care | Payer: BLUE CROSS/BLUE SHIELD | Source: Ambulatory Visit | Attending: Oncology | Admitting: Oncology

## 2017-02-27 DIAGNOSIS — Z0000 Encounter for general adult medical examination without abnormal findings: Secondary | ICD-10-CM

## 2017-02-27 DIAGNOSIS — R945 Abnormal results of liver function studies: Secondary | ICD-10-CM | POA: Diagnosis not present

## 2017-02-27 DIAGNOSIS — Z1231 Encounter for screening mammogram for malignant neoplasm of breast: Secondary | ICD-10-CM | POA: Insufficient documentation

## 2017-03-10 ENCOUNTER — Ambulatory Visit: Payer: BLUE CROSS/BLUE SHIELD | Admitting: Obstetrics and Gynecology

## 2017-03-28 DIAGNOSIS — B372 Candidiasis of skin and nail: Secondary | ICD-10-CM | POA: Diagnosis not present

## 2017-03-28 DIAGNOSIS — E119 Type 2 diabetes mellitus without complications: Secondary | ICD-10-CM | POA: Diagnosis not present

## 2017-03-28 DIAGNOSIS — D751 Secondary polycythemia: Secondary | ICD-10-CM | POA: Diagnosis not present

## 2017-03-28 DIAGNOSIS — J019 Acute sinusitis, unspecified: Secondary | ICD-10-CM | POA: Diagnosis not present

## 2017-03-28 DIAGNOSIS — H60502 Unspecified acute noninfective otitis externa, left ear: Secondary | ICD-10-CM | POA: Diagnosis not present

## 2017-04-07 DIAGNOSIS — R05 Cough: Secondary | ICD-10-CM | POA: Diagnosis not present

## 2017-04-07 DIAGNOSIS — K760 Fatty (change of) liver, not elsewhere classified: Secondary | ICD-10-CM | POA: Diagnosis not present

## 2017-04-07 DIAGNOSIS — J208 Acute bronchitis due to other specified organisms: Secondary | ICD-10-CM | POA: Diagnosis not present

## 2017-04-07 DIAGNOSIS — E119 Type 2 diabetes mellitus without complications: Secondary | ICD-10-CM | POA: Diagnosis not present

## 2017-04-07 DIAGNOSIS — B9689 Other specified bacterial agents as the cause of diseases classified elsewhere: Secondary | ICD-10-CM | POA: Diagnosis not present

## 2017-04-07 DIAGNOSIS — R748 Abnormal levels of other serum enzymes: Secondary | ICD-10-CM | POA: Diagnosis not present

## 2017-04-25 DIAGNOSIS — J40 Bronchitis, not specified as acute or chronic: Secondary | ICD-10-CM | POA: Diagnosis not present

## 2017-04-25 DIAGNOSIS — Z720 Tobacco use: Secondary | ICD-10-CM | POA: Diagnosis not present

## 2017-04-25 DIAGNOSIS — E119 Type 2 diabetes mellitus without complications: Secondary | ICD-10-CM | POA: Diagnosis not present

## 2017-05-06 ENCOUNTER — Inpatient Hospital Stay: Payer: BLUE CROSS/BLUE SHIELD

## 2017-05-06 ENCOUNTER — Inpatient Hospital Stay: Payer: BLUE CROSS/BLUE SHIELD | Admitting: Oncology

## 2017-05-12 DIAGNOSIS — K760 Fatty (change of) liver, not elsewhere classified: Secondary | ICD-10-CM | POA: Diagnosis not present

## 2017-05-12 DIAGNOSIS — Z1211 Encounter for screening for malignant neoplasm of colon: Secondary | ICD-10-CM | POA: Diagnosis not present

## 2017-05-12 DIAGNOSIS — R634 Abnormal weight loss: Secondary | ICD-10-CM | POA: Diagnosis not present

## 2017-05-12 DIAGNOSIS — R945 Abnormal results of liver function studies: Secondary | ICD-10-CM | POA: Diagnosis not present

## 2017-05-13 ENCOUNTER — Other Ambulatory Visit: Payer: Self-pay | Admitting: Student

## 2017-05-13 DIAGNOSIS — R7989 Other specified abnormal findings of blood chemistry: Secondary | ICD-10-CM

## 2017-05-13 DIAGNOSIS — R945 Abnormal results of liver function studies: Principal | ICD-10-CM

## 2017-05-13 DIAGNOSIS — K760 Fatty (change of) liver, not elsewhere classified: Secondary | ICD-10-CM

## 2017-05-20 ENCOUNTER — Ambulatory Visit: Payer: BLUE CROSS/BLUE SHIELD

## 2017-05-30 ENCOUNTER — Ambulatory Visit
Admission: RE | Admit: 2017-05-30 | Discharge: 2017-05-30 | Disposition: A | Discharge status: Home / Self Care | Payer: BLUE CROSS/BLUE SHIELD | Source: Ambulatory Visit | Attending: Student | Admitting: Student

## 2017-05-30 DIAGNOSIS — K8020 Calculus of gallbladder without cholecystitis without obstruction: Secondary | ICD-10-CM | POA: Insufficient documentation

## 2017-05-30 DIAGNOSIS — R945 Abnormal results of liver function studies: Secondary | ICD-10-CM | POA: Insufficient documentation

## 2017-05-30 DIAGNOSIS — K760 Fatty (change of) liver, not elsewhere classified: Secondary | ICD-10-CM | POA: Insufficient documentation

## 2017-05-30 DIAGNOSIS — R7989 Other specified abnormal findings of blood chemistry: Secondary | ICD-10-CM

## 2017-08-07 ENCOUNTER — Encounter: Payer: Self-pay | Admitting: *Deleted

## 2017-08-08 ENCOUNTER — Encounter: Payer: Self-pay | Admitting: *Deleted

## 2017-08-08 ENCOUNTER — Ambulatory Visit: Payer: BLUE CROSS/BLUE SHIELD | Admitting: Anesthesiology

## 2017-08-08 ENCOUNTER — Ambulatory Visit
Admission: RE | Admit: 2017-08-08 | Discharge: 2017-08-08 | Disposition: A | Discharge status: Home / Self Care | Payer: BLUE CROSS/BLUE SHIELD | Source: Ambulatory Visit | Attending: Unknown Physician Specialty | Admitting: Unknown Physician Specialty

## 2017-08-08 ENCOUNTER — Other Ambulatory Visit: Payer: Self-pay

## 2017-08-08 ENCOUNTER — Encounter
Admission: RE | Disposition: A | Discharge status: Home / Self Care | Payer: Self-pay | Source: Ambulatory Visit | Attending: Unknown Physician Specialty

## 2017-08-08 DIAGNOSIS — K2970 Gastritis, unspecified, without bleeding: Secondary | ICD-10-CM | POA: Diagnosis not present

## 2017-08-08 DIAGNOSIS — K209 Esophagitis, unspecified: Secondary | ICD-10-CM | POA: Diagnosis not present

## 2017-08-08 DIAGNOSIS — R011 Cardiac murmur, unspecified: Secondary | ICD-10-CM | POA: Insufficient documentation

## 2017-08-08 DIAGNOSIS — K635 Polyp of colon: Secondary | ICD-10-CM | POA: Insufficient documentation

## 2017-08-08 DIAGNOSIS — K621 Rectal polyp: Secondary | ICD-10-CM | POA: Diagnosis not present

## 2017-08-08 DIAGNOSIS — Z880 Allergy status to penicillin: Secondary | ICD-10-CM | POA: Insufficient documentation

## 2017-08-08 DIAGNOSIS — Z7984 Long term (current) use of oral hypoglycemic drugs: Secondary | ICD-10-CM | POA: Insufficient documentation

## 2017-08-08 DIAGNOSIS — K5790 Diverticulosis of intestine, part unspecified, without perforation or abscess without bleeding: Secondary | ICD-10-CM | POA: Diagnosis not present

## 2017-08-08 DIAGNOSIS — Z79899 Other long term (current) drug therapy: Secondary | ICD-10-CM | POA: Insufficient documentation

## 2017-08-08 DIAGNOSIS — Z1211 Encounter for screening for malignant neoplasm of colon: Secondary | ICD-10-CM | POA: Diagnosis not present

## 2017-08-08 DIAGNOSIS — K3189 Other diseases of stomach and duodenum: Secondary | ICD-10-CM | POA: Diagnosis not present

## 2017-08-08 DIAGNOSIS — K760 Fatty (change of) liver, not elsewhere classified: Secondary | ICD-10-CM | POA: Insufficient documentation

## 2017-08-08 DIAGNOSIS — E119 Type 2 diabetes mellitus without complications: Secondary | ICD-10-CM | POA: Diagnosis not present

## 2017-08-08 DIAGNOSIS — F17210 Nicotine dependence, cigarettes, uncomplicated: Secondary | ICD-10-CM | POA: Diagnosis not present

## 2017-08-08 DIAGNOSIS — R634 Abnormal weight loss: Secondary | ICD-10-CM | POA: Diagnosis not present

## 2017-08-08 DIAGNOSIS — D127 Benign neoplasm of rectosigmoid junction: Secondary | ICD-10-CM | POA: Diagnosis not present

## 2017-08-08 DIAGNOSIS — K648 Other hemorrhoids: Secondary | ICD-10-CM | POA: Diagnosis not present

## 2017-08-08 DIAGNOSIS — M170 Bilateral primary osteoarthritis of knee: Secondary | ICD-10-CM | POA: Insufficient documentation

## 2017-08-08 DIAGNOSIS — D128 Benign neoplasm of rectum: Secondary | ICD-10-CM | POA: Diagnosis not present

## 2017-08-08 DIAGNOSIS — K210 Gastro-esophageal reflux disease with esophagitis: Secondary | ICD-10-CM | POA: Diagnosis not present

## 2017-08-08 DIAGNOSIS — D125 Benign neoplasm of sigmoid colon: Secondary | ICD-10-CM | POA: Diagnosis not present

## 2017-08-08 DIAGNOSIS — D126 Benign neoplasm of colon, unspecified: Secondary | ICD-10-CM | POA: Diagnosis not present

## 2017-08-08 DIAGNOSIS — Z794 Long term (current) use of insulin: Secondary | ICD-10-CM | POA: Diagnosis not present

## 2017-08-08 DIAGNOSIS — K2960 Other gastritis without bleeding: Secondary | ICD-10-CM | POA: Diagnosis not present

## 2017-08-08 HISTORY — PX: ESOPHAGOGASTRODUODENOSCOPY (EGD) WITH PROPOFOL: SHX5813

## 2017-08-08 HISTORY — PX: COLONOSCOPY WITH PROPOFOL: SHX5780

## 2017-08-08 LAB — GLUCOSE, CAPILLARY: GLUCOSE-CAPILLARY: 113 mg/dL — AB (ref 65–99)

## 2017-08-08 SURGERY — ESOPHAGOGASTRODUODENOSCOPY (EGD) WITH PROPOFOL
Anesthesia: General

## 2017-08-08 MED ORDER — SODIUM CHLORIDE 0.9 % IV SOLN: INTRAVENOUS | Status: DC

## 2017-08-08 MED ORDER — PROPOFOL 10 MG/ML IV BOLUS
INTRAVENOUS | Status: AC
  Filled 2017-08-08: qty 20

## 2017-08-08 MED ORDER — PROPOFOL 10 MG/ML IV BOLUS
INTRAVENOUS | Status: DC | PRN
  Administered 2017-08-08: 70 mg via INTRAVENOUS
  Administered 2017-08-08: 10 mg via INTRAVENOUS

## 2017-08-08 MED ORDER — LIDOCAINE HCL (PF) 2 % IJ SOLN
INTRAMUSCULAR | Status: AC
  Filled 2017-08-08: qty 10

## 2017-08-08 MED ORDER — LIDOCAINE HCL (CARDIAC) 20 MG/ML IV SOLN
INTRAVENOUS | Status: DC | PRN
  Administered 2017-08-08: 100 mg via INTRAVENOUS

## 2017-08-08 MED ORDER — PROPOFOL 500 MG/50ML IV EMUL
INTRAVENOUS | Status: DC | PRN
  Administered 2017-08-08: 140 ug/kg/min via INTRAVENOUS

## 2017-08-08 MED ORDER — MIDAZOLAM HCL 2 MG/2ML IJ SOLN
INTRAMUSCULAR | Status: DC | PRN
  Administered 2017-08-08: 2 mg via INTRAVENOUS

## 2017-08-08 MED ORDER — MIDAZOLAM HCL 2 MG/2ML IJ SOLN
INTRAMUSCULAR | Status: AC
  Filled 2017-08-08: qty 2

## 2017-08-08 MED ORDER — PROPOFOL 500 MG/50ML IV EMUL
INTRAVENOUS | Status: AC
  Filled 2017-08-08: qty 50

## 2017-08-08 MED ORDER — SODIUM CHLORIDE 0.9 % IV SOLN
INTRAVENOUS | Status: DC
  Administered 2017-08-08: 08:00:00 via INTRAVENOUS

## 2017-08-08 NOTE — H&P (Signed)
Primary Care Physician:  Glendon Axe, MD Primary Gastroenterologist:  Dr. Vira Agar  Pre-Procedure History & Physical: HPI:  Kristen Wong is a 52 y.o. female is here for an endoscopy and colonoscopy.  This is for colon cancer screening and weight loss.   Past Medical History:  Diagnosis Date  . Arthritis    KNEES  . Blood dyscrasia    POLYCYTHEMIA  . Depression    H/O  . Diabetes mellitus without complication ([place]) 6734   Type 2-NEWLY DX  . Fatty liver   . GERD (gastroesophageal reflux disease)    OCC  . Heart murmur    ASYMPTOMATIC    Past Surgical History:  Procedure Laterality Date  . CESAREAN SECTION    . KNEE SURGERY     [facility]  . LAPAROSCOPIC HYSTERECTOMY Bilateral 01/22/2017   Procedure: HYSTERECTOMY TOTAL LAPAROSCOPIC BSO;  Surgeon: Malachy Mood, MD;  Location: [facility] ORS;  Service: Gynecology;  Laterality: Bilateral;    Prior to Admission medications   Medication Sig Start Date End Date Taking? Authorizing Provider  Albuterol Sulfate 108 (90 Base) MCG/ACT AEPB Inhale 2 Inhalers into the lungs every 6 (six) hours as needed.   Yes [provider]  cetirizine (ZYRTEC) 10 MG tablet Take 20 mg by mouth daily.   Yes [provider]  fluticasone (FLONASE) 50 MCG/ACT nasal spray Place 2 sprays into both nostrils daily as needed for allergies or rhinitis.   Yes [provider]  ibuprofen (ADVIL,MOTRIN) 600 MG tablet Take 1 tablet (600 mg total) by mouth every 6 (six) hours as needed for headache, moderate pain or cramping. 01/23/17  Yes Malachy Mood, MD  insulin glargine (LANTUS) 100 unit/mL SOPN Inject 20 Units into the skin at bedtime. 12/20/16 12/20/17 Yes [provider]  Insulin Pen Needle (FIFTY50 PEN NEEDLES) 32G X 6 MM MISC Use as directed. 12/20/16 12/20/17 Yes [provider]  metFORMIN (GLUCOPHAGE) 500 MG tablet Take 1 tablet (500 mg total) by mouth 2 (two) times daily with a meal. 12/12/16  Yes Delman Kitten, MD   nystatin (MYCOSTATIN/NYSTOP) powder Apply topically 2 (two) times daily. Until rash resolved. 02/26/17  Yes Secord, Venida Jarvis, MD  nystatin cream (MYCOSTATIN) Apply 1 application topically 2 (two) times daily. 12/25/16  Yes Malachy Mood, MD  oxyCODONE-acetaminophen (PERCOCET/ROXICET) 5-325 MG tablet Take 1-2 tablets by mouth every 4 (four) hours as needed (moderate to severe pain (when tolerating fluids)). 01/23/17  Yes Malachy Mood, MD  terconazole (TERAZOL 3) 0.8 % vaginal cream Place 1 applicator vaginally at bedtime. 12/25/16  Yes Malachy Mood, MD  varenicline (CHANTIX) 0.5 MG tablet Take 0.5 mg by mouth 2 (two) times daily.   Yes [provider]  Blood Glucose Monitoring Suppl (FIFTY50 GLUCOSE METER 2.0) w/Device KIT Use as directed. 12/17/16 12/17/17  [provider]  nicotine (NICODERM CQ - DOSED IN MG/24 HOURS) 21 mg/24hr patch Place 1 patch (21 mg total) onto the skin daily. Patient not taking: Reported on 02/26/2017 02/14/17   Earlie Server, MD    Allergies as of 05/26/2017 - Review Complete 02/26/2017  Allergen Reaction Noted  . Penicillin g Nausea Only 04/12/2016    Family History  Problem Relation Age of Onset  . Diabetes Mother   . Diabetes Father   . Diabetes Sister   . Breast cancer Neg Hx     Social History   Socioeconomic History  . Marital status: Divorced    Spouse name: Not on file  . Number of children: Not  on file  . Years of education: Not on file  . Highest education level: Not on file  Social Needs  . Financial resource strain: Not on file  . Food insecurity - worry: Not on file  . Food insecurity - inability: Not on file  . Transportation needs - medical: Not on file  . Transportation needs - non-medical: Not on file  Occupational History  . Not on file  Tobacco Use  . Smoking status: Current Every Day Smoker    Packs/day: 2.00    Years: 30.00    Pack years: 60.00    Types: Cigarettes  . Smokeless tobacco: Never Used   Substance and Sexual Activity  . Alcohol use: No  . Drug use: No  . Sexual activity: Not Currently  Other Topics Concern  . Not on file  Social History Narrative  . Not on file    Review of Systems: See HPI, otherwise negative ROS  Physical Exam: BP (!) 166/97   Pulse 80   Temp (!) 97.5 F (36.4 C) (Tympanic)   Resp 20   Ht _0  (1.626 m)   Wt 98 kg (216 lb)   SpO2 98%   BMI 37.08 kg/m  General:   Alert,  pleasant and cooperative in NAD Head:  Normocephalic and atraumatic. Neck:  Supple; no masses or thyromegaly. Lungs:  Clear throughout to auscultation.    Heart:  Regular rate and rhythm. Abdomen:  Soft, nontender and nondistended. Normal bowel sounds, without guarding, and without rebound.   Neurologic:  Alert and  oriented x4;  grossly normal neurologically.  Impression/Plan: Kristen Wong is here for an endoscopy and colonoscopy to be performed for weight loss and colon cancer screening.  Risks, benefits, limitations, and alternatives regarding  endoscopy and colonoscopy have been reviewed with the patient.  Questions have been answered.  All parties agreeable.   Gaylyn Cheers, MD  08/08/2017, 7:36 AM

## 2017-08-08 NOTE — Anesthesia Procedure Notes (Signed)
Performed by: Lance Muss, CRNA Pre-anesthesia Checklist: Patient identified, Emergency Drugs available, Suction available, Patient being monitored and Timeout performed Patient Re-evaluated:Patient Re-evaluated prior to induction Oxygen Delivery Method: Nasal cannula Induction Type: IV induction

## 2017-08-08 NOTE — Anesthesia Postprocedure Evaluation (Signed)
Anesthesia Post Note  Patient: Kristen Wong  Procedure(s) Performed: ESOPHAGOGASTRODUODENOSCOPY (EGD) WITH PROPOFOL (N/A ) COLONOSCOPY WITH PROPOFOL (N/A )  Patient location during evaluation: PACU Anesthesia Type: General Level of consciousness: awake and alert Pain management: pain level controlled Vital Signs Assessment: post-procedure vital signs reviewed and stable Respiratory status: spontaneous breathing, nonlabored ventilation, respiratory function stable and patient connected to nasal cannula oxygen Cardiovascular status: blood pressure returned to baseline and stable Postop Assessment: no apparent nausea or vomiting Anesthetic complications: no     Last Vitals:  Vitals:   08/08/17 0914 08/08/17 0934  BP: 138/89 (!) 143/67  Pulse:    Resp:    Temp:    SpO2:      Last Pain:  Vitals:   08/08/17 0712  TempSrc: Tympanic                 [name]

## 2017-08-08 NOTE — Op Note (Signed)
[facility] Gastroenterology Patient Name: Kristen Wong Procedure Date: 08/08/2017 7:25 AM MRN: [id number] Account #: [id number] Date of Birth: Jun 01, 1966 Admit Type: Outpatient Age: 52 Room: [facility] ENDO ROOM 1 Gender: Female Note Status: Finalized Procedure:            Colonoscopy Indications:          Screening for colorectal malignant neoplasm Providers:            Manya Silvas, MD Referring MD:         Glendon Axe (Referring MD) Medicines:            Propofol per Anesthesia Complications:        No immediate complications. Procedure:            Pre-Anesthesia Assessment:                       - After reviewing the risks and benefits, the patient                        was deemed in satisfactory condition to undergo the                        procedure.                       After obtaining informed consent, the colonoscope was                        passed under direct vision. Throughout the procedure,                        the patient's blood pressure, pulse, and oxygen                        saturations were monitored continuously. The                        Colonoscope was introduced through the anus and                        advanced to the the cecum, identified by appendiceal                        orifice and ileocecal valve. The colonoscopy was                        performed without difficulty. The patient tolerated the                        procedure well. The quality of the bowel preparation                        was good. Findings:      A diminutive polyp was found in the cecum. The polyp was sessile. The       polyp was removed with a jumbo cold forceps. Resection and retrieval       were complete.      A diminutive polyp was found in the descending colon. The polyp was       sessile. The polyp was removed with a jumbo cold forceps. Resection and  retrieval were complete.      Two sessile polyps were found in the descending colon.  The polyps were       diminutive in size. These polyps were removed with a jumbo cold forceps.       Resection and retrieval were complete.      Four sessile polyps were found in the descending colon. The polyps were       diminutive in size. These polyps were removed with a jumbo cold forceps.       Resection and retrieval were complete.      A diminutive polyp was found in the descending colon. The polyp was       sessile. The polyp was removed with a cold snare. Resection and       retrieval were complete.      Two sessile polyps were found in the sigmoid colon. The polyps were       small in size. These polyps were removed with a hot snare. Resection and       retrieval were complete.      Two sessile polyps were found in the recto-sigmoid colon. The polyps       were diminutive in size. These polyps were removed with a jumbo cold       forceps. Resection and retrieval were complete.      A small polyp was found in the recto-sigmoid colon. The polyp was       sessile. The polyp was removed with a hot snare. Resection and retrieval       were complete.      Three sessile polyps were found in the colon. The polyps were small in       size. These polyps were removed with a hot snare. Resection and       retrieval were complete.      A diminutive polyp was found in the rectum. The polyp was sessile.       Biopsies were taken with a cold forceps for histology. Also cold snare       times one. Impression:           - One diminutive polyp in the cecum, removed with a                        jumbo cold forceps. Resected and retrieved.                       - One diminutive polyp in the descending colon, removed                        with a jumbo cold forceps. Resected and retrieved.                       - Two diminutive polyps in the descending colon,                        removed with a jumbo cold forceps. Resected and                        retrieved.                       - Four  diminutive polyps in the descending colon,  removed with a jumbo cold forceps. Resected and                        retrieved.                       - One diminutive polyp in the descending colon, removed                        with a cold snare. Resected and retrieved.                       - Two small polyps in the sigmoid colon, removed with a                        hot snare. Resected and retrieved.                       - Two diminutive polyps at the recto-sigmoid colon,                        removed with a jumbo cold forceps. Resected and                        retrieved.                       - One small polyp at the recto-sigmoid colon, removed                        with a hot snare. Resected and retrieved.                       - Three small polyps, removed with a hot snare.                        Resected and retrieved.                       - One diminutive polyp in the rectum. Biopsied. Recommendation:       - Await pathology results. Manya Silvas, MD 08/08/2017 9:10:13 AM This report has been signed electronically. Number of Addenda: 0 Note Initiated On: 08/08/2017 7:25 AM Scope Withdrawal Time: 0 hours 44 minutes 2 seconds  Total Procedure Duration: 0 hours 54 minutes 28 seconds       [facility]

## 2017-08-08 NOTE — Transfer of Care (Signed)
Immediate Anesthesia Transfer of Care Note  Patient: Kristen Wong  Procedure(s) Performed: ESOPHAGOGASTRODUODENOSCOPY (EGD) WITH PROPOFOL (N/A ) COLONOSCOPY WITH PROPOFOL (N/A )  Patient Location: PACU  Anesthesia Type:General  Level of Consciousness: awake and responds to stimulation  Airway & Oxygen Therapy: Patient Spontanous Breathing and Patient connected to nasal cannula oxygen  Post-op Assessment: Report given to RN and Post -op Vital signs reviewed and stable  Post vital signs: Reviewed and stable  Last Vitals:  Vitals:   08/08/17 0904 08/08/17 0906  BP:  121/76  Pulse:  89  Resp:  16  Temp: (!) (P) 36.1 C   SpO2:  100%    Last Pain:  Vitals:   08/08/17 0712  TempSrc: Tympanic      Patients Stated Pain Goal: 8 (16/10/96 0454)  Complications: No apparent anesthesia complications

## 2017-08-08 NOTE — Anesthesia Preprocedure Evaluation (Signed)
Anesthesia Evaluation  Patient identified by MRN, date of birth, ID band Patient awake    Reviewed: Allergy & Precautions, H&P , NPO status , Patient's Chart, lab work & pertinent test results, reviewed documented beta blocker date and time   Airway Mallampati: III   Neck ROM: full    Dental  (+) Poor Dentition, Upper Dentures   Pulmonary neg pulmonary ROS, Current Smoker,    Pulmonary exam normal        Cardiovascular Exercise Tolerance: Poor negative cardio ROS Normal cardiovascular exam+ Valvular Problems/Murmurs  Rhythm:regular Rate:Normal     Neuro/Psych PSYCHIATRIC DISORDERS negative neurological ROS  negative psych ROS   GI/Hepatic negative GI ROS, Neg liver ROS, GERD  Medicated,  Endo/Other  negative endocrine ROSdiabetes, Poorly Controlled, Type 2, Oral Hypoglycemic Agents  Renal/GU negative Renal ROS  negative genitourinary   Musculoskeletal   Abdominal   Peds  Hematology negative hematology ROS (+) Blood dyscrasia, ,   Anesthesia Other Findings Past Medical History: No date: Arthritis     Comment:  KNEES No date: Blood dyscrasia     Comment:  POLYCYTHEMIA No date: Depression     Comment:  H/O 2018: Diabetes mellitus without complication ([facility])     Comment:  Type 2-NEWLY DX No date: Fatty liver No date: GERD (gastroesophageal reflux disease)     Comment:  OCC No date: Heart murmur     Comment:  ASYMPTOMATIC Past Surgical History: No date: CESAREAN SECTION No date: KNEE SURGERY     Comment:  [facility] 01/22/2017: LAPAROSCOPIC HYSTERECTOMY; Bilateral     Comment:  Procedure: HYSTERECTOMY TOTAL LAPAROSCOPIC BSO;                Surgeon: Malachy Mood, MD;  Location: [facility] ORS;                Service: Gynecology;  Laterality: Bilateral; BMI    Body Mass Index:  37.08 kg/m     Reproductive/Obstetrics negative OB ROS                             Anesthesia Physical Anesthesia  Plan  ASA: III  Anesthesia Plan: General   Post-op Pain Management:    Induction:   PONV Risk Score and Plan:   Airway Management Planned:   Additional Equipment:   Intra-op Plan:   Post-operative Plan:   Informed Consent: I have reviewed the patients History and Physical, chart, labs and discussed the procedure including the risks, benefits and alternatives for the proposed anesthesia with the patient or authorized representative who has indicated his/her understanding and acceptance.   Dental Advisory Given  Plan Discussed with: CRNA  Anesthesia Plan Comments:         Anesthesia Quick Evaluation

## 2017-08-08 NOTE — Op Note (Addendum)
[facility] Gastroenterology Patient Name: Kristen Wong Procedure Date: 08/08/2017 7:25 AM MRN: [id number] Account #: [id number] Date of Birth: Aug 18, 1965 Admit Type: Outpatient Age: 52 Room: [facility] ENDO ROOM 1 Gender: Female Note Status: Supervisor Override Procedure:            Upper GI endoscopy Indications:          Weight loss Providers:            Manya Silvas, MD Medicines:            Propofol per Anesthesia Complications:        No immediate complications. Procedure:            Pre-Anesthesia Assessment:                       - After reviewing the risks and benefits, the patient                        was deemed in satisfactory condition to undergo the                        procedure.                       After obtaining informed consent, the endoscope was                        passed under direct vision. Throughout the procedure,                        the patient's blood pressure, pulse, and oxygen                        saturations were monitored continuously. The Endoscope                        was introduced through the mouth, and advanced to the                        second part of duodenum. The upper GI endoscopy was                        accomplished without difficulty. The patient tolerated                        the procedure well. Findings:      LA Grade A (one or more mucosal breaks less than 5 mm, not extending       between tops of 2 mucosal folds) esophagitis with no bleeding was found       40 cm from the incisors. Biopsies were taken with a cold forceps for       histology.      Striped mildly erythematous mucosa without bleeding was found in the       gastric body. Biopsies were taken with a cold forceps for histology.       Biopsies were taken with a cold forceps for Helicobacter pylori testing.      The gastric antrum was normal. Biopsies were taken with a cold forceps       for Helicobacter pylori testing.      The duodenal  bulb and second portion of the duodenum  were normal. Impression:           - LA Grade A reflux esophagitis. Biopsied.                       - Erythematous mucosa in the gastric body. Biopsied.                       - Normal antrum. Biopsied.                       - Normal duodenal bulb and second portion of the                        duodenum. Recommendation:       - Await pathology results. Manya Silvas, MD 08/08/2017 8:03:43 AM This report has been signed electronically. Number of Addenda: 0 Note Initiated On: 08/08/2017 7:25 AM      [facility]

## 2017-08-08 NOTE — Anesthesia Post-op Follow-up Note (Signed)
Anesthesia QCDR form completed.        

## 2017-08-09 ENCOUNTER — Encounter: Payer: Self-pay | Admitting: Unknown Physician Specialty

## 2017-08-11 LAB — SURGICAL PATHOLOGY

## 2017-09-04 DIAGNOSIS — R748 Abnormal levels of other serum enzymes: Secondary | ICD-10-CM | POA: Diagnosis not present

## 2017-09-04 DIAGNOSIS — E119 Type 2 diabetes mellitus without complications: Secondary | ICD-10-CM | POA: Diagnosis not present

## 2017-09-04 DIAGNOSIS — M2550 Pain in unspecified joint: Secondary | ICD-10-CM | POA: Insufficient documentation

## 2017-09-04 DIAGNOSIS — J0101 Acute recurrent maxillary sinusitis: Secondary | ICD-10-CM | POA: Diagnosis not present

## 2017-09-08 DIAGNOSIS — J40 Bronchitis, not specified as acute or chronic: Secondary | ICD-10-CM | POA: Diagnosis not present

## 2017-09-08 DIAGNOSIS — Z720 Tobacco use: Secondary | ICD-10-CM | POA: Diagnosis not present

## 2017-09-08 DIAGNOSIS — E119 Type 2 diabetes mellitus without complications: Secondary | ICD-10-CM | POA: Diagnosis not present

## 2017-10-02 DIAGNOSIS — K760 Fatty (change of) liver, not elsewhere classified: Secondary | ICD-10-CM | POA: Diagnosis not present

## 2017-10-02 DIAGNOSIS — R945 Abnormal results of liver function studies: Secondary | ICD-10-CM | POA: Diagnosis not present

## 2017-10-02 DIAGNOSIS — K219 Gastro-esophageal reflux disease without esophagitis: Secondary | ICD-10-CM | POA: Diagnosis not present

## 2017-10-14 DIAGNOSIS — R748 Abnormal levels of other serum enzymes: Secondary | ICD-10-CM | POA: Diagnosis not present

## 2017-10-14 DIAGNOSIS — M2550 Pain in unspecified joint: Secondary | ICD-10-CM | POA: Diagnosis not present

## 2017-10-14 DIAGNOSIS — M170 Bilateral primary osteoarthritis of knee: Secondary | ICD-10-CM | POA: Diagnosis not present

## 2017-10-14 DIAGNOSIS — M19042 Primary osteoarthritis, left hand: Secondary | ICD-10-CM | POA: Diagnosis not present

## 2017-10-14 DIAGNOSIS — D751 Secondary polycythemia: Secondary | ICD-10-CM | POA: Diagnosis not present

## 2017-10-14 DIAGNOSIS — M19041 Primary osteoarthritis, right hand: Secondary | ICD-10-CM | POA: Diagnosis not present

## 2017-11-06 DIAGNOSIS — M170 Bilateral primary osteoarthritis of knee: Secondary | ICD-10-CM | POA: Diagnosis not present

## 2017-11-06 DIAGNOSIS — M19041 Primary osteoarthritis, right hand: Secondary | ICD-10-CM | POA: Insufficient documentation

## 2017-11-06 DIAGNOSIS — M19042 Primary osteoarthritis, left hand: Secondary | ICD-10-CM | POA: Diagnosis not present

## 2017-11-06 DIAGNOSIS — Z6841 Body Mass Index (BMI) 40.0 and over, adult: Secondary | ICD-10-CM | POA: Diagnosis not present

## 2017-11-11 ENCOUNTER — Other Ambulatory Visit: Payer: Self-pay | Admitting: Student

## 2017-11-11 DIAGNOSIS — K7400 Hepatic fibrosis, unspecified: Secondary | ICD-10-CM

## 2017-12-03 DIAGNOSIS — K760 Fatty (change of) liver, not elsewhere classified: Secondary | ICD-10-CM | POA: Diagnosis not present

## 2017-12-03 DIAGNOSIS — E119 Type 2 diabetes mellitus without complications: Secondary | ICD-10-CM | POA: Diagnosis not present

## 2017-12-03 DIAGNOSIS — R945 Abnormal results of liver function studies: Secondary | ICD-10-CM | POA: Diagnosis not present

## 2017-12-10 DIAGNOSIS — E119 Type 2 diabetes mellitus without complications: Secondary | ICD-10-CM | POA: Diagnosis not present

## 2017-12-10 DIAGNOSIS — N951 Menopausal and female climacteric states: Secondary | ICD-10-CM | POA: Insufficient documentation

## 2017-12-10 DIAGNOSIS — J40 Bronchitis, not specified as acute or chronic: Secondary | ICD-10-CM | POA: Diagnosis not present

## 2017-12-10 DIAGNOSIS — M170 Bilateral primary osteoarthritis of knee: Secondary | ICD-10-CM | POA: Diagnosis not present

## 2018-01-09 ENCOUNTER — Ambulatory Visit
Admission: RE | Admit: 2018-01-09 | Discharge: 2018-01-09 | Disposition: A | Discharge status: Home / Self Care | Payer: BLUE CROSS/BLUE SHIELD | Source: Ambulatory Visit | Attending: Student | Admitting: Student

## 2018-01-09 DIAGNOSIS — K8020 Calculus of gallbladder without cholecystitis without obstruction: Secondary | ICD-10-CM | POA: Diagnosis not present

## 2018-01-09 DIAGNOSIS — K760 Fatty (change of) liver, not elsewhere classified: Secondary | ICD-10-CM | POA: Insufficient documentation

## 2018-01-09 DIAGNOSIS — K7400 Hepatic fibrosis, unspecified: Secondary | ICD-10-CM

## 2018-03-20 DIAGNOSIS — E119 Type 2 diabetes mellitus without complications: Secondary | ICD-10-CM | POA: Diagnosis not present

## 2018-03-20 DIAGNOSIS — K760 Fatty (change of) liver, not elsewhere classified: Secondary | ICD-10-CM | POA: Diagnosis not present

## 2018-03-20 DIAGNOSIS — K219 Gastro-esophageal reflux disease without esophagitis: Secondary | ICD-10-CM | POA: Diagnosis not present

## 2018-03-20 DIAGNOSIS — K740 Hepatic fibrosis: Secondary | ICD-10-CM | POA: Diagnosis not present

## 2018-03-20 DIAGNOSIS — Z794 Long term (current) use of insulin: Secondary | ICD-10-CM | POA: Diagnosis not present

## 2018-03-20 DIAGNOSIS — Z789 Other specified health status: Secondary | ICD-10-CM | POA: Diagnosis not present

## 2018-03-25 DIAGNOSIS — Z23 Encounter for immunization: Secondary | ICD-10-CM | POA: Diagnosis not present

## 2018-04-01 DIAGNOSIS — B9689 Other specified bacterial agents as the cause of diseases classified elsewhere: Secondary | ICD-10-CM | POA: Diagnosis not present

## 2018-04-01 DIAGNOSIS — J019 Acute sinusitis, unspecified: Secondary | ICD-10-CM | POA: Diagnosis not present

## 2018-04-01 DIAGNOSIS — J209 Acute bronchitis, unspecified: Secondary | ICD-10-CM | POA: Diagnosis not present

## 2018-04-06 ENCOUNTER — Emergency Department: Payer: BLUE CROSS/BLUE SHIELD

## 2018-04-06 ENCOUNTER — Other Ambulatory Visit: Payer: Self-pay

## 2018-04-06 ENCOUNTER — Emergency Department
Admission: EM | Admit: 2018-04-06 | Discharge: 2018-04-06 | Disposition: A | Discharge status: Home / Self Care | Payer: BLUE CROSS/BLUE SHIELD | Attending: Emergency Medicine | Admitting: Emergency Medicine

## 2018-04-06 DIAGNOSIS — Z7984 Long term (current) use of oral hypoglycemic drugs: Secondary | ICD-10-CM | POA: Insufficient documentation

## 2018-04-06 DIAGNOSIS — F329 Major depressive disorder, single episode, unspecified: Secondary | ICD-10-CM | POA: Diagnosis not present

## 2018-04-06 DIAGNOSIS — Z79899 Other long term (current) drug therapy: Secondary | ICD-10-CM | POA: Diagnosis not present

## 2018-04-06 DIAGNOSIS — E119 Type 2 diabetes mellitus without complications: Secondary | ICD-10-CM | POA: Diagnosis not present

## 2018-04-06 DIAGNOSIS — R05 Cough: Secondary | ICD-10-CM | POA: Diagnosis not present

## 2018-04-06 DIAGNOSIS — R0602 Shortness of breath: Secondary | ICD-10-CM | POA: Diagnosis not present

## 2018-04-06 DIAGNOSIS — J189 Pneumonia, unspecified organism: Secondary | ICD-10-CM | POA: Insufficient documentation

## 2018-04-06 DIAGNOSIS — F17210 Nicotine dependence, cigarettes, uncomplicated: Secondary | ICD-10-CM | POA: Insufficient documentation

## 2018-04-06 DIAGNOSIS — R062 Wheezing: Secondary | ICD-10-CM | POA: Diagnosis not present

## 2018-04-06 DIAGNOSIS — R5383 Other fatigue: Secondary | ICD-10-CM | POA: Diagnosis not present

## 2018-04-06 LAB — BASIC METABOLIC PANEL
Anion gap: 14 (ref 5–15)
BUN: 22 mg/dL — ABNORMAL HIGH (ref 6–20)
CHLORIDE: 99 mmol/L (ref 98–111)
CO2: 25 mmol/L (ref 22–32)
CREATININE: 0.59 mg/dL (ref 0.44–1.00)
Calcium: 9.6 mg/dL (ref 8.9–10.3)
GFR calc non Af Amer: >60 mL/min (ref >60)
Glucose, Bld: 137 mg/dL — ABNORMAL HIGH (ref 70–99)
POTASSIUM: 3.5 mmol/L (ref 3.5–5.1)
SODIUM: 138 mmol/L (ref 135–145)

## 2018-04-06 LAB — CBC
HCT: 54.5 % — ABNORMAL HIGH (ref 36.0–46.0)
HEMOGLOBIN: 18.5 g/dL — AB (ref 12.0–15.0)
MCH: 30.6 pg (ref 26.0–34.0)
MCHC: 33.9 g/dL (ref 30.0–36.0)
MCV: 90.2 fL (ref 80.0–100.0)
NRBC: 0 % (ref 0.0–0.2)
Platelets: 393 10*3/uL (ref 150–400)
RBC: 6.04 MIL/uL — AB (ref 3.87–5.11)
RDW: 13.2 % (ref 11.5–15.5)
WBC: 21.4 10*3/uL — AB (ref 4.0–10.5)

## 2018-04-06 LAB — LACTIC ACID, PLASMA: Lactic Acid, Venous: 1.6 mmol/L (ref 0.5–1.9)

## 2018-04-06 MED ORDER — SODIUM CHLORIDE 0.9 % IV SOLN: 1.0000 g | Freq: Once | INTRAVENOUS | Status: DC

## 2018-04-06 MED ORDER — AZITHROMYCIN 250 MG PO TABS: ORAL_TABLET | ORAL | 0 refills | Status: DC

## 2018-04-06 MED ORDER — SODIUM CHLORIDE 0.9 % IV BOLUS
1000.0000 mL | Freq: Once | INTRAVENOUS | Status: AC
  Administered 2018-04-06: 1000 mL via INTRAVENOUS

## 2018-04-06 MED ORDER — AZITHROMYCIN 500 MG PO TABS
500.0000 mg | ORAL_TABLET | Freq: Once | ORAL | Status: AC
  Administered 2018-04-06: 500 mg via ORAL
  Filled 2018-04-06: qty 1

## 2018-04-06 MED ORDER — CEFPODOXIME PROXETIL 200 MG PO TABS: 200.0000 mg | ORAL_TABLET | Freq: Two times a day (BID) | ORAL | 0 refills | Status: AC

## 2018-04-06 MED ORDER — ALBUTEROL SULFATE (2.5 MG/3ML) 0.083% IN NEBU
5.0000 mg | INHALATION_SOLUTION | Freq: Once | RESPIRATORY_TRACT | Status: AC
  Administered 2018-04-06: 5 mg via RESPIRATORY_TRACT
  Filled 2018-04-06: qty 6

## 2018-04-06 MED ORDER — SODIUM CHLORIDE 0.9 % IV SOLN
1.0000 g | Freq: Once | INTRAVENOUS | Status: AC
  Administered 2018-04-06: 1 g via INTRAVENOUS
  Filled 2018-04-06: qty 10

## 2018-04-06 NOTE — Discharge Instructions (Addendum)
You were seen in the Emergency Department (ED) today and diagnosed with pneumonia. Pneumonia is an infection of the lungs. Most cases are caused by infections from bacteria or viruses.   Pneumonia may be mild or very severe. If it is caused by bacteria, you will be treated with antibiotics. It may take a few weeks to a few months to recover fully from pneumonia, depending on how sick you were and whether your overall health is good.   If you were given antibiotics, take the prescription fully as prescribed.  Follow-up with your doctor in 1-2 days for further evaluation.  When should you call for help?  Call 911 anytime you think you may need emergency care. For example, call if:  You have difficulty breathing or chest pain  Call your doctor now or seek immediate medical care if:  You cough up dark brown or bloody mucus (sputum).  You have new or worse trouble breathing.  You are dizzy or lightheaded, or you feel like you may faint.  Watch closely for changes in your health, and be sure to contact your doctor if:  You have a new or higher fever.  You are coughing more deeply or more often.  You are not getting better after 2 days (48 hours).  You do not get better as expected   How can you care for yourself at home?  Take your antibiotics exactly as directed. Do not stop taking the medicine just because you are feeling better. You need to take the full course of antibiotics.  Take your medicines exactly as prescribed. Call your doctor if you think you are having a problem with your medicine.  Get plenty of rest and sleep. You may feel weak and tired for a while, but your energy level will improve with time.  To prevent dehydration, drink plenty of fluids, enough so that your urine is light yellow or clear like water. Choose water and other caffeine-free clear liquids until you feel better. If you have kidney, heart, or liver disease and have to limit fluids, talk with your doctor before you  increase the amount of fluids you drink.  Take care of your cough so you can rest. A cough that brings up mucus from your lungs is common with pneumonia. It is one way your body gets rid of the infection. But if coughing keeps you from resting or causes severe fatigue and chest-wall pain, talk to your doctor. He or she may suggest that you take a medicine to reduce the cough.  Use a vaporizer or humidifier to add moisture to your bedroom. Follow the directions for cleaning the machine.  Do not smoke or allow others to smoke around you. Smoke will make your cough last longer. If you need help quitting, talk to your doctor about stop-smoking programs and medicines. These can increase your chances of quitting for good.  Take an over-the-counter pain medicine, such as acetaminophen (Tylenol), ibuprofen (Advil, Motrin), or naproxen (Aleve). Read and follow all instructions on the label.  Do not take two or more pain medicines at the same time unless the doctor told you to. Many pain medicines have acetaminophen, which is Tylenol. Too much acetaminophen (Tylenol) can be harmful.  If you were given a spirometer to measure how well your lungs are working, use it as instructed. This can help your doctor tell how your recovery is going.  To prevent pneumonia in the future, talk to your doctor about getting a flu vaccine (once a  year) and a pneumococcal vaccine (one time only for most people).

## 2018-04-06 NOTE — ED Triage Notes (Signed)
Pt c/o sinusitis and bronchitis. Sent over by [facility] for tachycardia. C/o cough and congestion since last week.

## 2018-04-06 NOTE — ED Triage Notes (Signed)
Brought from [facility] for failed outpatient treatment of m cvnnm, 10 S/m  -+inusitis and bro  nchitis.  Patient not in distress in wheelchair at rest.

## 2018-04-06 NOTE — ED Provider Notes (Signed)
[facility] Emergency Department Provider Note  ____________________________________________  Time seen: Approximately 5:04 PM  I have reviewed the triage vital signs and the nursing notes.   HISTORY  Chief Complaint Cough; Nasal Congestion; and Shortness of Breath   HPI Kristen Wong is a 52 y.o. female with a history of diabetes, fatty liver, smoking who presents for evaluation of cough and congestion.  Patient reports that her symptoms started a week ago.  She has had a productive cough however she has been having difficulty bringing up sputum.  No hemoptysis.  She also has had congestion and body aches.  She has had chills but no fever.  No nausea or vomiting.  Patient had diarrhea in the beginning of the week however that has resolved.  She denies chest pain or shortness of breath.  She went to urgent care today and was sent to the emergency room due to tachycardia.  Patient reports that she has been taking some leftover prednisone she had at home for the last 5 days.  Her last dose was yesterday.  Past Medical History:  Diagnosis Date  . Arthritis    KNEES  . Blood dyscrasia    POLYCYTHEMIA  . Depression    H/O  . Diabetes mellitus without complication ([place]) 2778   Type 2-NEWLY DX  . Fatty liver   . GERD (gastroesophageal reflux disease)    OCC  . Heart murmur    ASYMPTOMATIC    Patient Active Problem List   Diagnosis Date Noted  . Endometrial hyperplasia 02/26/2017  . S/P laparoscopic hysterectomy 01/22/2017  . Endometrial hyperplasia without atypia, complex 01/15/2017  . Elevated hemoglobin ([place]) 01/15/2017  . Class 2 obesity with serious comorbidity and body mass index (BMI) of 38.0 to 38.9 in adult 01/15/2017  . Type 2 diabetes mellitus without complication, without long-term current use of insulin ([place]) 01/15/2017  . Metabolic syndrome 24/23/5361  . Tobacco abuse counseling 01/15/2017    Past Surgical History:  Procedure Laterality  Date  . CESAREAN SECTION    . COLONOSCOPY WITH PROPOFOL N/A 08/08/2017   Procedure: COLONOSCOPY WITH PROPOFOL;  Surgeon: Manya Silvas, MD;  Location: [facility] ENDOSCOPY;  Service: Endoscopy;  Laterality: N/A;  . ESOPHAGOGASTRODUODENOSCOPY (EGD) WITH PROPOFOL N/A 08/08/2017   Procedure: ESOPHAGOGASTRODUODENOSCOPY (EGD) WITH PROPOFOL;  Surgeon: Manya Silvas, MD;  Location: [facility] ENDOSCOPY;  Service: Endoscopy;  Laterality: N/A;  . KNEE SURGERY     [facility]  . LAPAROSCOPIC HYSTERECTOMY Bilateral 01/22/2017   Procedure: HYSTERECTOMY TOTAL LAPAROSCOPIC BSO;  Surgeon: Malachy Mood, MD;  Location: [facility] ORS;  Service: Gynecology;  Laterality: Bilateral;    Prior to Admission medications   Medication Sig Start Date End Date Taking? Authorizing Provider  Albuterol Sulfate 108 (90 Base) MCG/ACT AEPB Inhale 2 Inhalers into the lungs every 6 (six) hours as needed.    [provider]  azithromycin (ZITHROMAX) 250 MG tablet Take 1 a day for 4 days 04/06/18   Alfred Levins, [place], MD  cefpodoxime (VANTIN) 200 MG tablet Take 1 tablet (200 mg total) by mouth 2 (two) times daily for 10 days. 04/06/18 04/16/18  Rudene Re, MD  cetirizine (ZYRTEC) 10 MG tablet Take 20 mg by mouth daily.    [provider]  fluticasone (FLONASE) 50 MCG/ACT nasal spray Place 2 sprays into both nostrils daily as needed for allergies or rhinitis.    [provider]  ibuprofen (ADVIL,MOTRIN) 600 MG tablet Take 1 tablet (600 mg total) by mouth every 6 (six)  hours as needed for headache, moderate pain or cramping. 01/23/17   Malachy Mood, MD  insulin glargine (LANTUS) 100 unit/mL SOPN Inject 20 Units into the skin at bedtime. 12/20/16 12/20/17  [provider]  metFORMIN (GLUCOPHAGE) 500 MG tablet Take 1 tablet (500 mg total) by mouth 2 (two) times daily with a meal. 12/12/16   Delman Kitten, MD  nicotine (NICODERM CQ - DOSED IN MG/24 HOURS) 21 mg/24hr patch Place 1 patch (21 mg total) onto the skin  daily. Patient not taking: Reported on 02/26/2017 02/14/17   Earlie Server, MD  nystatin (MYCOSTATIN/NYSTOP) powder Apply topically 2 (two) times daily. Until rash resolved. 02/26/17   [place], Venida Jarvis, MD  nystatin cream (MYCOSTATIN) Apply 1 application topically 2 (two) times daily. 12/25/16   Malachy Mood, MD  oxyCODONE-acetaminophen (PERCOCET/ROXICET) 5-325 MG tablet Take 1-2 tablets by mouth every 4 (four) hours as needed (moderate to severe pain (when tolerating fluids)). 01/23/17   Malachy Mood, MD  terconazole (TERAZOL 3) 0.8 % vaginal cream Place 1 applicator vaginally at bedtime. 12/25/16   Malachy Mood, MD  varenicline (CHANTIX) 0.5 MG tablet Take 0.5 mg by mouth 2 (two) times daily.    [provider]    Allergies Penicillin g  Family History  Problem Relation Age of Onset  . Diabetes Mother   . Diabetes Father   . Diabetes Sister   . Breast cancer Neg Hx     Social History Social History   Tobacco Use  . Smoking status: Current Every Day Smoker    Packs/day: 2.00    Years: 30.00    Pack years: 60.00    Types: Cigarettes  . Smokeless tobacco: Never Used  Substance Use Topics  . Alcohol use: No  . Drug use: No    Review of Systems  Constitutional: Negative for fever. + malaise Eyes: Negative for visual changes. ENT: Negative for sore throat. + congestion Neck: No neck pain  Cardiovascular: Negative for chest pain. Respiratory: Negative for shortness of breath. + cough Gastrointestinal: Negative for abdominal pain, vomiting. + diarrhea. Genitourinary: Negative for dysuria. Musculoskeletal: Negative for back pain. Skin: Negative for rash. Neurological: Negative for headaches, weakness or numbness. Psych: No SI or HI  ____________________________________________   PHYSICAL EXAM:  VITAL SIGNS: ED Triage Vitals [04/06/18 1141]  Enc Vitals Group     BP (!) 158/95     Pulse Rate (!) 117     Resp 18     Temp 97.9 F (36.6 C)      Temp src      SpO2 95 %     Weight 234 lb (106.1 kg)     Height 5' 4"  (1.626 m)     Head Circumference      Peak Flow      Pain Score 0     Pain Loc      Pain Edu?      Excl. in [place]?     Constitutional: Alert and oriented. Well appearing and in no apparent distress. HEENT:      Head: Normocephalic and atraumatic.         Eyes: Conjunctivae are normal. Sclera is non-icteric.       Mouth/Throat: Mucous membranes are moist.       Neck: Supple with no signs of meningismus. Cardiovascular: Tachycardic with regular rhythm. No murmurs, gallops, or rubs. 2+ symmetrical distal pulses are present in all extremities. No JVD. Respiratory: Normal respiratory effort. Lungs are clear to auscultation bilaterally. No wheezes,  crackles, or rhonchi.  Gastrointestinal: Soft, non tender, and non distended with positive bowel sounds. No rebound or guarding. Musculoskeletal: Nontender with normal range of motion in all extremities. No edema, cyanosis, or erythema of extremities. Neurologic: Normal speech and language. Face is symmetric. Moving all extremities. No gross focal neurologic deficits are appreciated. Skin: Skin is warm, dry and intact. No rash noted. Psychiatric: Mood and affect are normal. Speech and behavior are normal.  ____________________________________________   LABS (all labs ordered are listed, but only abnormal results are displayed)  Labs Reviewed  CBC - Abnormal; Notable for the following components:      Result Value   WBC 21.4 (*)    RBC 6.04 (*)    Hemoglobin 18.5 (*)    HCT 54.5 (*)    All other components within normal limits  BASIC METABOLIC PANEL - Abnormal; Notable for the following components:   Glucose, Bld 137 (*)    BUN 22 (*)    All other components within normal limits  CULTURE, BLOOD (ROUTINE X 2)  CULTURE, BLOOD (ROUTINE X 2)  LACTIC ACID, PLASMA   ____________________________________________  EKG  ED ECG REPORT I, Rudene Re, the attending  physician, personally viewed and interpreted this ECG.  Sinus tachycardia, rate of 109, normal intervals, normal axis, no ST elevations or depressions, anterior Q waves.  Unchanged from prior. ____________________________________________  RADIOLOGY  I have personally reviewed the images performed during this visit and I agree with the Radiologist's read.   Interpretation by Radiologist:  Dg Chest 2 View  Result Date: 04/06/2018 CLINICAL DATA:  Clinical sinusitis and bronchitis. Found to have tachycardia. Cough and chest congestion since last week. History of diabetes, current smoker. EXAM: CHEST - 2 VIEW COMPARISON:  PA and lateral chest x-ray dated January 21, 2017 FINDINGS: The lungs are well-expanded. The interstitial markings are chronically increased with slightly increased markings overall in the lower lung zones. There is no alveolar infiltrate. The heart and pulmonary vascularity are normal. The mediastinum is normal in width. There is calcification in the wall of the aortic arch. The bony thorax exhibits no acute abnormality. There is multilevel degenerative disc disease of the thoracic spine. IMPRESSION: COPD-smoking related changes. Superimposed subsegmental atelectasis or early pneumonia at the lung bases greatest on the right. Followup PA and lateral chest X-ray is recommended in 3-4 weeks following trial of antibiotic therapy to ensure resolution and exclude underlying malignancy. Thoracic aortic atherosclerosis. Electronically Signed   By: David  Martinique M.D.   On: 04/06/2018 12:12     ____________________________________________   PROCEDURES  Procedure(s) performed: None Procedures Critical Care performed:  None ____________________________________________   INITIAL IMPRESSION / ASSESSMENT AND PLAN / ED COURSE   52 y.o. female with a history of diabetes, fatty liver, smoking who presents for evaluation of cough and congestion and malaise x 7 days. Patient on prednisone.   Patient is well-appearing and in no distress, she is tachycardic but afebrile and normotensive, lungs are clear to auscultation with good air movement and no wheezing.  Labs showing white count of 21K and chest x-ray confirms right-sided pneumonia.  No recent admission over the last 3 months therefore will treat as community-acquired pneumonia.  Leukocytosis could be due to being currently on prednisone.  Will check a lactic acid to evaluate for signs of sepsis.  Will start patient on IV Rocephin and azithromycin.  Clinical Course as of Apr 06 1836  Mon Apr 06, 2018  1835 Lactic acid is normal.  Patient feels  improved after fluids and antibiotics.  Her heart rate has come down.  She remains afebrile.  Her leukocytosis is most likely due to prednisone plus infection but at this time there are no signs of sepsis.  Blood cultures are pending.  Patient received IV Rocephin and azithromycin.  Will discharge home on Augmentin and Zithromax.  Discussed return precautions for chest pain, shortness of breath or fever.  Otherwise patient will follow-up with her doctor in 2 days for reevaluation.   [CV]    Clinical Course User Index [CV] Alfred Levins [place], MD     As part of my medical decision making, I reviewed the following data within the [place] notes reviewed and incorporated, Labs reviewed , EKG interpreted , Old EKG reviewed, Old chart reviewed, Radiograph reviewed , Notes from prior ED visits and [place] Controlled Substance Database    Pertinent labs & imaging results that were available during my care of the patient were reviewed by me and considered in my medical decision making (see chart for details).    ____________________________________________   FINAL CLINICAL IMPRESSION(S) / ED DIAGNOSES  Final diagnoses:  Community acquired pneumonia, unspecified laterality      NEW MEDICATIONS STARTED DURING THIS VISIT:  ED Discharge Orders         Ordered     cefpodoxime (VANTIN) 200 MG tablet  2 times daily     04/06/18 1836    azithromycin (ZITHROMAX) 250 MG tablet     04/06/18 1836           Note:  This document was prepared using Dragon voice recognition software and may include unintentional dictation errors.    Rudene Re, MD 04/06/18 [phone]

## 2018-04-06 NOTE — ED Notes (Signed)
PT c/o pain to bilateral knees. Pt repeatedly talks about knee pain and swelling due to "having them bent up". Pt noted to be sitting in a chair.  States she needs to stretch her legs out. This RN apologized for delay at this time.

## 2018-04-08 DIAGNOSIS — R000 Tachycardia, unspecified: Secondary | ICD-10-CM | POA: Diagnosis not present

## 2018-04-08 DIAGNOSIS — J181 Lobar pneumonia, unspecified organism: Secondary | ICD-10-CM | POA: Diagnosis not present

## 2018-04-08 DIAGNOSIS — I10 Essential (primary) hypertension: Secondary | ICD-10-CM | POA: Diagnosis not present

## 2018-04-10 DIAGNOSIS — R918 Other nonspecific abnormal finding of lung field: Secondary | ICD-10-CM | POA: Diagnosis not present

## 2018-04-10 DIAGNOSIS — J449 Chronic obstructive pulmonary disease, unspecified: Secondary | ICD-10-CM | POA: Diagnosis not present

## 2018-04-10 DIAGNOSIS — R0609 Other forms of dyspnea: Secondary | ICD-10-CM | POA: Diagnosis not present

## 2018-04-11 LAB — CULTURE, BLOOD (ROUTINE X 2)
CULTURE: NO GROWTH
Culture: NO GROWTH
Special Requests: ADEQUATE
Special Requests: ADEQUATE

## 2018-04-13 DIAGNOSIS — R5383 Other fatigue: Secondary | ICD-10-CM | POA: Diagnosis not present

## 2018-04-13 DIAGNOSIS — I10 Essential (primary) hypertension: Secondary | ICD-10-CM | POA: Diagnosis not present

## 2018-04-13 DIAGNOSIS — J181 Lobar pneumonia, unspecified organism: Secondary | ICD-10-CM | POA: Diagnosis not present

## 2018-04-28 ENCOUNTER — Other Ambulatory Visit: Payer: Self-pay | Admitting: Specialist

## 2018-04-28 DIAGNOSIS — R0602 Shortness of breath: Secondary | ICD-10-CM | POA: Diagnosis not present

## 2018-04-28 DIAGNOSIS — I10 Essential (primary) hypertension: Secondary | ICD-10-CM | POA: Diagnosis not present

## 2018-04-28 DIAGNOSIS — F17210 Nicotine dependence, cigarettes, uncomplicated: Secondary | ICD-10-CM | POA: Diagnosis not present

## 2018-04-28 DIAGNOSIS — R0609 Other forms of dyspnea: Secondary | ICD-10-CM | POA: Diagnosis not present

## 2018-04-28 DIAGNOSIS — J849 Interstitial pulmonary disease, unspecified: Secondary | ICD-10-CM | POA: Diagnosis not present

## 2018-04-28 DIAGNOSIS — E119 Type 2 diabetes mellitus without complications: Secondary | ICD-10-CM | POA: Diagnosis not present

## 2018-04-28 DIAGNOSIS — Z794 Long term (current) use of insulin: Secondary | ICD-10-CM | POA: Diagnosis not present

## 2018-04-28 DIAGNOSIS — Z23 Encounter for immunization: Secondary | ICD-10-CM | POA: Diagnosis not present

## 2018-05-21 ENCOUNTER — Ambulatory Visit
Admission: RE | Admit: 2018-05-21 | Discharge: 2018-05-21 | Disposition: A | Discharge status: Home / Self Care | Payer: BLUE CROSS/BLUE SHIELD | Source: Ambulatory Visit | Attending: Specialist | Admitting: Specialist

## 2018-05-21 ENCOUNTER — Other Ambulatory Visit: Payer: Self-pay | Admitting: Specialist

## 2018-05-21 DIAGNOSIS — J849 Interstitial pulmonary disease, unspecified: Secondary | ICD-10-CM | POA: Diagnosis not present

## 2018-05-21 DIAGNOSIS — R0602 Shortness of breath: Secondary | ICD-10-CM | POA: Diagnosis not present

## 2018-06-11 DIAGNOSIS — R05 Cough: Secondary | ICD-10-CM | POA: Diagnosis not present

## 2018-06-11 DIAGNOSIS — J449 Chronic obstructive pulmonary disease, unspecified: Secondary | ICD-10-CM | POA: Diagnosis not present

## 2018-06-11 DIAGNOSIS — J208 Acute bronchitis due to other specified organisms: Secondary | ICD-10-CM | POA: Diagnosis not present

## 2018-06-11 DIAGNOSIS — R911 Solitary pulmonary nodule: Secondary | ICD-10-CM | POA: Diagnosis not present

## 2018-06-12 ENCOUNTER — Other Ambulatory Visit (HOSPITAL_COMMUNITY): Payer: Self-pay | Admitting: Specialist

## 2018-06-12 ENCOUNTER — Other Ambulatory Visit: Payer: Self-pay | Admitting: Specialist

## 2018-06-12 DIAGNOSIS — J849 Interstitial pulmonary disease, unspecified: Secondary | ICD-10-CM

## 2018-07-09 DIAGNOSIS — H40033 Anatomical narrow angle, bilateral: Secondary | ICD-10-CM | POA: Diagnosis not present

## 2018-07-29 DIAGNOSIS — R740 Nonspecific elevation of levels of transaminase and lactic acid dehydrogenase [LDH]: Secondary | ICD-10-CM | POA: Diagnosis not present

## 2018-08-05 DIAGNOSIS — E119 Type 2 diabetes mellitus without complications: Secondary | ICD-10-CM | POA: Diagnosis not present

## 2018-08-05 DIAGNOSIS — Z794 Long term (current) use of insulin: Secondary | ICD-10-CM | POA: Diagnosis not present

## 2018-08-05 DIAGNOSIS — N951 Menopausal and female climacteric states: Secondary | ICD-10-CM | POA: Diagnosis not present

## 2018-10-01 DIAGNOSIS — J0110 Acute frontal sinusitis, unspecified: Secondary | ICD-10-CM | POA: Diagnosis not present

## 2018-12-04 ENCOUNTER — Other Ambulatory Visit: Payer: Self-pay

## 2018-12-04 ENCOUNTER — Ambulatory Visit
Admission: RE | Admit: 2018-12-04 | Discharge: 2018-12-04 | Disposition: A | Discharge status: Home / Self Care | Payer: BC Managed Care – PPO | Source: Ambulatory Visit | Attending: Specialist | Admitting: Specialist

## 2018-12-04 DIAGNOSIS — R918 Other nonspecific abnormal finding of lung field: Secondary | ICD-10-CM | POA: Diagnosis not present

## 2018-12-04 DIAGNOSIS — J849 Interstitial pulmonary disease, unspecified: Secondary | ICD-10-CM

## 2018-12-04 DIAGNOSIS — I700 Atherosclerosis of aorta: Secondary | ICD-10-CM | POA: Diagnosis not present

## 2018-12-15 DIAGNOSIS — E119 Type 2 diabetes mellitus without complications: Secondary | ICD-10-CM | POA: Diagnosis not present

## 2018-12-15 DIAGNOSIS — F17210 Nicotine dependence, cigarettes, uncomplicated: Secondary | ICD-10-CM | POA: Diagnosis not present

## 2018-12-15 DIAGNOSIS — D751 Secondary polycythemia: Secondary | ICD-10-CM | POA: Diagnosis not present

## 2018-12-15 DIAGNOSIS — I10 Essential (primary) hypertension: Secondary | ICD-10-CM | POA: Diagnosis not present

## 2018-12-17 DIAGNOSIS — Z794 Long term (current) use of insulin: Secondary | ICD-10-CM | POA: Diagnosis not present

## 2018-12-17 DIAGNOSIS — R918 Other nonspecific abnormal finding of lung field: Secondary | ICD-10-CM | POA: Diagnosis not present

## 2018-12-17 DIAGNOSIS — J449 Chronic obstructive pulmonary disease, unspecified: Secondary | ICD-10-CM | POA: Diagnosis not present

## 2018-12-17 DIAGNOSIS — I10 Essential (primary) hypertension: Secondary | ICD-10-CM | POA: Diagnosis not present

## 2018-12-17 DIAGNOSIS — R0600 Dyspnea, unspecified: Secondary | ICD-10-CM | POA: Diagnosis not present

## 2018-12-17 DIAGNOSIS — E119 Type 2 diabetes mellitus without complications: Secondary | ICD-10-CM | POA: Diagnosis not present

## 2018-12-22 ENCOUNTER — Other Ambulatory Visit: Payer: Self-pay | Admitting: Oncology

## 2018-12-22 ENCOUNTER — Telehealth: Payer: Self-pay | Admitting: *Deleted

## 2018-12-22 DIAGNOSIS — D751 Secondary polycythemia: Secondary | ICD-10-CM

## 2018-12-22 NOTE — Telephone Encounter (Signed)
Per Dr.Yu have pt RTC  for lab/MD/+/- Phlebotomy Appts. were scheduled as requested.. A detailed message was left on pt vmail making her  aware of the scheduled time, date and loation of the appt.

## 2018-12-25 ENCOUNTER — Inpatient Hospital Stay: Payer: BC Managed Care – PPO | Admitting: Oncology

## 2018-12-25 ENCOUNTER — Inpatient Hospital Stay: Payer: BC Managed Care – PPO

## 2019-01-08 ENCOUNTER — Other Ambulatory Visit: Payer: Self-pay

## 2019-01-08 ENCOUNTER — Encounter: Payer: Self-pay | Admitting: Oncology

## 2019-01-08 ENCOUNTER — Inpatient Hospital Stay: Payer: BC Managed Care – PPO

## 2019-01-08 ENCOUNTER — Inpatient Hospital Stay (HOSPITAL_BASED_OUTPATIENT_CLINIC_OR_DEPARTMENT_OTHER): Payer: BC Managed Care – PPO | Admitting: Oncology

## 2019-01-08 ENCOUNTER — Inpatient Hospital Stay: Payer: BC Managed Care – PPO | Attending: Oncology

## 2019-01-08 VITALS — BP 123/70 | HR 71 | Temp 97.5°F | Wt 238.0 lb

## 2019-01-08 DIAGNOSIS — Z716 Tobacco abuse counseling: Secondary | ICD-10-CM

## 2019-01-08 DIAGNOSIS — Z7984 Long term (current) use of oral hypoglycemic drugs: Secondary | ICD-10-CM | POA: Insufficient documentation

## 2019-01-08 DIAGNOSIS — Z791 Long term (current) use of non-steroidal anti-inflammatories (NSAID): Secondary | ICD-10-CM | POA: Diagnosis not present

## 2019-01-08 DIAGNOSIS — D751 Secondary polycythemia: Secondary | ICD-10-CM | POA: Diagnosis not present

## 2019-01-08 DIAGNOSIS — E119 Type 2 diabetes mellitus without complications: Secondary | ICD-10-CM | POA: Insufficient documentation

## 2019-01-08 DIAGNOSIS — Z79899 Other long term (current) drug therapy: Secondary | ICD-10-CM | POA: Diagnosis not present

## 2019-01-08 DIAGNOSIS — Z803 Family history of malignant neoplasm of breast: Secondary | ICD-10-CM | POA: Diagnosis not present

## 2019-01-08 DIAGNOSIS — F329 Major depressive disorder, single episode, unspecified: Secondary | ICD-10-CM

## 2019-01-08 DIAGNOSIS — R918 Other nonspecific abnormal finding of lung field: Secondary | ICD-10-CM | POA: Insufficient documentation

## 2019-01-08 DIAGNOSIS — J449 Chronic obstructive pulmonary disease, unspecified: Secondary | ICD-10-CM

## 2019-01-08 DIAGNOSIS — F17210 Nicotine dependence, cigarettes, uncomplicated: Secondary | ICD-10-CM

## 2019-01-08 DIAGNOSIS — R635 Abnormal weight gain: Secondary | ICD-10-CM

## 2019-01-08 LAB — CBC WITH DIFFERENTIAL/PLATELET
Abs Immature Granulocytes: 0.03 10*3/uL (ref 0.00–0.07)
Basophils Absolute: 0 10*3/uL (ref 0.0–0.1)
Basophils Relative: 0 %
Eosinophils Absolute: 0.2 10*3/uL (ref 0.0–0.5)
Eosinophils Relative: 2 %
HCT: 45.8 % (ref 36.0–46.0)
Hemoglobin: 15.4 g/dL — ABNORMAL HIGH (ref 12.0–15.0)
Immature Granulocytes: 0 %
Lymphocytes Relative: 41 %
Lymphs Abs: 4.4 10*3/uL — ABNORMAL HIGH (ref 0.7–4.0)
MCH: 30.6 pg (ref 26.0–34.0)
MCHC: 33.6 g/dL (ref 30.0–36.0)
MCV: 91.1 fL (ref 80.0–100.0)
Monocytes Absolute: 0.6 10*3/uL (ref 0.1–1.0)
Monocytes Relative: 5 %
Neutro Abs: 5.4 10*3/uL (ref 1.7–7.7)
Neutrophils Relative %: 52 %
Platelets: 358 10*3/uL (ref 150–400)
RBC: 5.03 MIL/uL (ref 3.87–5.11)
RDW: 13.5 % (ref 11.5–15.5)
WBC: 10.6 10*3/uL — ABNORMAL HIGH (ref 4.0–10.5)
nRBC: 0 % (ref 0.0–0.2)

## 2019-01-08 LAB — COMPREHENSIVE METABOLIC PANEL
ALT: 46 U/L — ABNORMAL HIGH (ref 0–44)
AST: 41 U/L (ref 15–41)
Albumin: 3.8 g/dL (ref 3.5–5.0)
Alkaline Phosphatase: 96 U/L (ref 38–126)
Anion gap: 10 (ref 5–15)
BUN: 12 mg/dL (ref 6–20)
CO2: 26 mmol/L (ref 22–32)
Calcium: 8.7 mg/dL — ABNORMAL LOW (ref 8.9–10.3)
Chloride: 104 mmol/L (ref 98–111)
Creatinine, Ser: 0.59 mg/dL (ref 0.44–1.00)
GFR calc Af Amer: >60 mL/min (ref >60)
GFR calc non Af Amer: >60 mL/min (ref >60)
Glucose, Bld: 113 mg/dL — ABNORMAL HIGH (ref 70–99)
Potassium: 3.6 mmol/L (ref 3.5–5.1)
Sodium: 140 mmol/L (ref 135–145)
Total Bilirubin: 0.5 mg/dL (ref 0.3–1.2)
Total Protein: 7.1 g/dL (ref 6.5–8.1)

## 2019-01-08 LAB — FERRITIN: Ferritin: 151 ng/mL (ref 11–307)

## 2019-01-08 NOTE — Progress Notes (Signed)
Patient here today for follow up.   

## 2019-01-09 NOTE — Progress Notes (Signed)
[facility] follow up visit  Patient Care Team: [place] as PCP - General  CHIEF COMPLAINTS/PURPOSE OF CONSULTATION: Re-establish care for elevated hemoglobin.    HISTORY OF PRESENTING ILLNESS: Kristen Wong 53 y.o. female with PMH listed as below was referred by he primary care provider Dr.Singh for evaluation of high hemoglobin.  Patient is accompanied by her daughter and sister to the clinic visit. She recently has D&C for evaluation of postmenopausal bleeding. Pathology showed endometrium hyperplasia, but a well differentiated neoplasm can not be excluded. She was seen by GynOnc Dr.Secord today and she will get a hysterotomy next week. She also reports recently newly diagnosed DM for which she was started on Metformin and she is having some GI side effects and her appetite is decreased. She has had unintentional weight loss as well.  Patient otherwise feeling well. She smokes 1 pack of cigarettes/day for about 35 years. She lives at home with daughter.   INTERVAL HISTORY 53 y.o. female presents for follow up of elevated blood count.  Patient was seen by me on 02/14/2017, lost follow up.  Erythrocytosis work up revealed:  Epo level at high normal limit, elevated carboxyhemoglobin level, negative JAK2 exon 12-15  mutation analysis Secondary erythrocytosis, likely due to smoking.   She continues to smoke daily.  Appetite is fair. She has gained about 23 pounds since her last visit on 02/14/2017. Saw pulmonology Dr.Fleming recently for dyspnea, COPD, on albuterol inhaler.  Lung nodules, <58m. Dr.Flemming recommend repeat CT in 4- 6 months.  Feels stressed since mother died in [id number] She smokes more cigarettes.   Review of Systems  Constitutional: Positive for fatigue and unexpected weight change. Negative for appetite change, chills and fever.  HENT:   Negative for hearing loss and voice change.   Eyes: Negative for eye problems.  Respiratory:  Negative for chest tightness and cough.   Cardiovascular: Negative for chest pain.  Gastrointestinal: Negative for abdominal distention, abdominal pain and blood in stool.  Endocrine: Negative for hot flashes.  Genitourinary: Negative for difficulty urinating and frequency.   Musculoskeletal: Negative for arthralgias.  Skin: Negative for itching and rash.  Neurological: Negative for extremity weakness.  Hematological: Negative for adenopathy.  Psychiatric/Behavioral: Negative for confusion.    MEDICAL HISTORY: Past Medical History:  Diagnosis Date  . Arthritis    KNEES  . Blood dyscrasia    POLYCYTHEMIA  . Depression    H/O  . Diabetes mellitus [place] complication ([id number] [id number]  Type 2-NEWLY DX  . Fatty liver   . GERD (gastroesophageal reflux disease)    OCC  . Heart murmur    ASYMPTOMATIC    SURGICAL HISTORY: Past Surgical History:  Procedure Laterality Date  . CESAREAN SECTION    . COLONOSCOPY WITH PROPOFOL N/A 08/08/2017   Procedure: COLONOSCOPY WITH PROPOFOL;  Surgeon: EManya Silvas MD;  [facility];  Service: Endoscopy;  Laterality: N/A;  . ESOPHAGOGASTRODUODENOSCOPY (EGD) WITH PROPOFOL N/A 08/08/2017   Procedure: ESOPHAGOGASTRODUODENOSCOPY (EGD) WITH PROPOFOL;  Surgeon: EManya Silvas MD;  [facility];  Service: Endoscopy;  Laterality: N/A;  . KNEE SURGERY     [facility]  . LAPAROSCOPIC HYSTERECTOMY Bilateral 01/22/2017   Procedure: HYSTERECTOMY TOTAL LAPAROSCOPIC BSO;  Surgeon: SMalachy Mood MD;  Location: [facility] ORS;  Service: Gynecology;  Laterality: Bilateral;    SOCIAL HISTORY: Social History   Socioeconomic History  . Marital status: Divorced    Spouse name: Not on file  . Number of  children: Not on file  . Years of education: Not on file  . Highest education level: Not on file  Occupational History  . Not on file  Social Needs  . Financial resource strain: Not on file  . Food insecurity    Worry: Not on file     Inability: Not on file  . Transportation needs    Medical: Not on file    Non-medical: Not on file  Tobacco Use  . Smoking status: Current Every Day Smoker    Packs/day: 2.00    Years: 30.00    Pack years: 60.00    Types: Cigarettes  . Smokeless tobacco: Never Used  Substance and Sexual Activity  . Alcohol use: No  . Drug use: No  . Sexual activity: Not Currently  Lifestyle  . Physical activity    Days per week: Not on file    Minutes per session: Not on file  . Stress: Not on file  Relationships  . Social Herbalist on phone: Not on file    Gets together: Not on file    Attends religious service: Not on file    Active member of club or organization: Not on file    Attends meetings of clubs or organizations: Not on file    Relationship status: Not on file  . Intimate partner violence    Fear of current or ex partner: Not on file    Emotionally abused: Not on file    Physically abused: Not on file    Forced sexual activity: Not on file  Other Topics Concern  . Not on file  Social History Narrative  . Not on file    FAMILY HISTORY Family History  Problem Relation Age of Onset  . Diabetes Mother   . Diabetes Father   . Diabetes Sister   . Breast cancer Neg Hx     ALLERGIES:  is allergic to penicillin g.  MEDICATIONS:  Current Outpatient Medications  Medication Sig Dispense Refill  . Albuterol Sulfate 108 (90 Base) MCG/ACT AEPB Inhale 2 Inhalers into the lungs every 6 (six) hours as needed.    . cetirizine (ZYRTEC) 10 MG tablet Take 20 mg by mouth daily.    . fluticasone (FLONASE) 50 MCG/ACT nasal spray Place 2 sprays into both nostrils daily as needed for allergies or rhinitis.    Marland Kitchen ibuprofen (ADVIL,MOTRIN) 600 MG tablet Take 1 tablet (600 mg total) by mouth every 6 (six) hours as needed for headache, moderate pain or cramping. 60 tablet 1  . metFORMIN (GLUCOPHAGE) 500 MG tablet Take 1 tablet (500 mg total) by mouth 2 (two) times daily with a meal. 60  tablet 0  . nystatin (MYCOSTATIN/NYSTOP) powder Apply topically 2 (two) times daily. Until rash resolved. 15 g 0  . oxyCODONE-acetaminophen (PERCOCET/ROXICET) 5-325 MG tablet Take 1-2 tablets by mouth every 4 (four) hours as needed (moderate to severe pain (when tolerating fluids)). 40 tablet 0  . terconazole (TERAZOL 3) 0.8 % vaginal cream Place 1 applicator vaginally at bedtime. 20 g 1   No current facility-administered medications for this visit.     PHYSICAL EXAMINATION:  ECOG PERFORMANCE STATUS: 1 - Symptomatic but completely ambulatory  Vitals:   01/08/19 1402  BP: 123/70  Pulse: 71  Temp: (!) 97.5 F (36.4 C)    Filed Weights   01/08/19 1402  Weight: 238 lb (108 kg)    Physical Exam  Constitutional: She is oriented to person, place, and time. No  distress.  Obese.   HENT:  Head: Normocephalic and atraumatic.  Mouth/Throat: No oropharyngeal exudate.  Eyes: Pupils are equal, round, and reactive to light. EOM are normal. No scleral icterus.  Neck: Normal range of motion. Neck supple.  Cardiovascular: Normal rate and regular rhythm.  No murmur heard. Pulmonary/Chest: Effort normal. No respiratory distress.  Decreased breath sounds bilaterally.   Abdominal: Soft. She exhibits no distension. There is no abdominal tenderness.  Musculoskeletal: Normal range of motion.        General: No edema.     Comments: Left lower extremity compression stocking.   Neurological: She is alert and oriented to person, place, and time. No cranial nerve deficit. She exhibits normal muscle tone. Coordination normal.  Skin: Skin is warm and dry. She is not diaphoretic. No erythema.  Psychiatric: Affect normal.    LABORATORY DATA: I have personally reviewed the data as listed: CBC Latest Ref Rng & Units 01/08/2019 04/06/2018 01/23/2017  WBC 4.0 - 10.5 K/uL 10.6(H) 21.4(H) 17.1(H)  Hemoglobin 12.0 - 15.0 g/dL 15.4(H) 18.5(H) 15.5  Hematocrit 36.0 - 46.0 % 45.8 54.5(H) 46.0  Platelets 150 -  400 K/uL 358 393 346   CMP Latest Ref Rng & Units 01/08/2019 04/06/2018 01/23/2017  Glucose 70 - 99 mg/dL 113(H) 137(H) 161(H)  BUN 6 - 20 mg/dL 12 22(H) 10  Creatinine 0.44 - 1.00 mg/dL 0.59 0.59 0.45  Sodium 135 - 145 mmol/L 140 138 139  Potassium 3.5 - 5.1 mmol/L 3.6 3.5 3.8  Chloride 98 - 111 mmol/L 104 99 107  CO2 22 - 32 mmol/L 26 25 25   Calcium 8.9 - 10.3 mg/dL 8.7(L) 9.6 8.5(L)  Total Protein 6.5 - 8.1 g/dL 7.1 - -  Total Bilirubin 0.3 - 1.2 mg/dL 0.5 - -  Alkaline Phos 38 - 126 U/L 96 - -  AST 15 - 41 U/L 41 - -  ALT 0 - 44 U/L 46(H) - -   RADIOGRAPHIC STUDIES: I have personally reviewed the radiological images as listed and agreed with the findings in the report. Ct Chest Wo Contrast  Result Date: 12/04/2018 CLINICAL DATA:  Follow-up lung nodule. EXAM: CT CHEST WITHOUT CONTRAST TECHNIQUE: Multidetector CT imaging of the chest was performed following the standard protocol without IV contrast. COMPARISON:  05/21/2018 FINDINGS: Cardiovascular: Normal heart size. No pericardial effusion. Aortic atherosclerosis. Lad and left circumflex coronary artery calcifications. Mediastinum/Nodes: Thyroid gland enlargement. The trachea appears patent and is midline. Normal appearance of the esophagus. No supraclavicular, axillary, mediastinal or hilar adenopathy. Lungs/Pleura: No pleural effusion. Small pulmonary nodules are again noted. Nodule in the right apex measures 4 mm, image 20/3.  Unchanged. Right middle lobe lung nodule measures 4 mm, image 80/3. Also unchanged. Stable subpleural nodule within the lateral right middle lobe measuring 5 mm, image 93/3. Posterior right lower lobe lung nodule measures 4 mm, image 90/3. 3 mm subpleural nodule in the lateral left lower lobe identified, image 116/3. 4 mm left lower lobe lung nodule is identified, image 103/3. 5 mm posteromedial subpleural nodule in the left lower lobe is stable, image 66/3. Left upper lobe lung nodule measures 3 mm and is unchanged,  image 65/3. No new or enlarging pulmonary nodules. Upper Abdomen: No acute abnormality. Musculoskeletal: Spondylosis identified within the thoracic spine. No aggressive lytic or sclerotic bone lesions identified. IMPRESSION: 1. Unchanged bilateral pulmonary nodules measuring up to 5 mm. If the patient is at low risk no further follow-up is indicated. Non-contrast chest CT can be considered at 12 months (  from 05/21/2018) if patient is high-risk. This recommendation follows the consensus statement: Guidelines for Management of Incidental Pulmonary Nodules Detected on CT Images: From the Fleischner Society 2017; Radiology 2017; 284:228-243. 2. Aortic Atherosclerosis (ICD10-I70.0). Coronary artery calcifications. Electronically Signed   By: Kerby Moors M.D.   On: 12/04/2018 09:47     ASSESSMENT/PLAN 1. Polycythemia, secondary   2. Tobacco abuse counseling   3. Lung nodules   4. Weight gain    Labs are reviewed and discussed with patient.  Secondary erythrocytosis, due to smoking, She is also at risk of sleep apnea due to morbid obesity.  Smoking cessation is discussed and she is not interested.  Check JAK2 with reflex to CARL/MPL Continue Aspirin 41m daily.  Hold phlebotomy if Hct<50   Lung nodules, follows up with Dr.Fleming, repeat CT in 4-6 months.  Overdue for mammogram. Discussed.  Weight gain, life style modification discussed with patient. Encourage exercise and healthy diet.  Follow up in 10 weeks.    ZEarlie Server MD  01/09/2019 8:12 PM

## 2019-01-26 DIAGNOSIS — F411 Generalized anxiety disorder: Secondary | ICD-10-CM | POA: Diagnosis not present

## 2019-01-26 DIAGNOSIS — D751 Secondary polycythemia: Secondary | ICD-10-CM | POA: Diagnosis not present

## 2019-01-26 LAB — CALR + MPL (REFLEXED)

## 2019-01-26 LAB — JAK2 W/REFLEX TO CALR/MPL

## 2019-03-11 DIAGNOSIS — E119 Type 2 diabetes mellitus without complications: Secondary | ICD-10-CM | POA: Diagnosis not present

## 2019-03-11 DIAGNOSIS — J329 Chronic sinusitis, unspecified: Secondary | ICD-10-CM | POA: Diagnosis not present

## 2019-03-11 DIAGNOSIS — H6692 Otitis media, unspecified, left ear: Secondary | ICD-10-CM | POA: Diagnosis not present

## 2019-03-11 DIAGNOSIS — Z03818 Encounter for observation for suspected exposure to other biological agents ruled out: Secondary | ICD-10-CM | POA: Diagnosis not present

## 2019-03-19 ENCOUNTER — Inpatient Hospital Stay (HOSPITAL_BASED_OUTPATIENT_CLINIC_OR_DEPARTMENT_OTHER): Payer: BC Managed Care – PPO | Admitting: Oncology

## 2019-03-19 ENCOUNTER — Inpatient Hospital Stay: Payer: BC Managed Care – PPO

## 2019-03-19 ENCOUNTER — Other Ambulatory Visit: Payer: Self-pay

## 2019-03-19 ENCOUNTER — Inpatient Hospital Stay: Payer: BC Managed Care – PPO | Attending: Oncology

## 2019-03-19 ENCOUNTER — Encounter: Payer: Self-pay | Admitting: Oncology

## 2019-03-19 VITALS — BP 107/71 | HR 80 | Temp 97.7°F | Resp 18 | Wt 231.9 lb

## 2019-03-19 DIAGNOSIS — F17210 Nicotine dependence, cigarettes, uncomplicated: Secondary | ICD-10-CM | POA: Insufficient documentation

## 2019-03-19 DIAGNOSIS — J449 Chronic obstructive pulmonary disease, unspecified: Secondary | ICD-10-CM | POA: Diagnosis not present

## 2019-03-19 DIAGNOSIS — E119 Type 2 diabetes mellitus without complications: Secondary | ICD-10-CM | POA: Insufficient documentation

## 2019-03-19 DIAGNOSIS — Z716 Tobacco abuse counseling: Secondary | ICD-10-CM

## 2019-03-19 DIAGNOSIS — D751 Secondary polycythemia: Secondary | ICD-10-CM

## 2019-03-19 DIAGNOSIS — R918 Other nonspecific abnormal finding of lung field: Secondary | ICD-10-CM

## 2019-03-19 LAB — CBC WITH DIFFERENTIAL/PLATELET
Abs Immature Granulocytes: 0.03 10*3/uL (ref 0.00–0.07)
Basophils Absolute: 0 10*3/uL (ref 0.0–0.1)
Basophils Relative: 0 %
Eosinophils Absolute: 0.3 10*3/uL (ref 0.0–0.5)
Eosinophils Relative: 3 %
HCT: 47.9 % — ABNORMAL HIGH (ref 36.0–46.0)
Hemoglobin: 16 g/dL — ABNORMAL HIGH (ref 12.0–15.0)
Immature Granulocytes: 0 %
Lymphocytes Relative: 52 %
Lymphs Abs: 6 10*3/uL — ABNORMAL HIGH (ref 0.7–4.0)
MCH: 30.3 pg (ref 26.0–34.0)
MCHC: 33.4 g/dL (ref 30.0–36.0)
MCV: 90.7 fL (ref 80.0–100.0)
Monocytes Absolute: 0.8 10*3/uL (ref 0.1–1.0)
Monocytes Relative: 7 %
Neutro Abs: 4.4 10*3/uL (ref 1.7–7.7)
Neutrophils Relative %: 38 %
Platelets: 359 10*3/uL (ref 150–400)
RBC: 5.28 MIL/uL — ABNORMAL HIGH (ref 3.87–5.11)
RDW: 13.7 % (ref 11.5–15.5)
WBC: 11.6 10*3/uL — ABNORMAL HIGH (ref 4.0–10.5)
nRBC: 0 % (ref 0.0–0.2)

## 2019-03-19 NOTE — Progress Notes (Signed)
Patient here for follow up. Pt complains of hot flashes.

## 2019-03-19 NOTE — Progress Notes (Signed)
Kristen Wong follow up visit  Patient Care Team: [place] as PCP - General  CHIEF COMPLAINTS/PURPOSE OF CONSULTATION: Re-establish care for elevated hemoglobin.    HISTORY OF PRESENTING ILLNESS: Kristen Wong 53 y.o. female with PMH listed as below was referred by he primary care provider Dr.Singh for evaluation of high hemoglobin.  Patient is accompanied by her daughter and sister to the clinic visit. She recently has D&C for evaluation of postmenopausal bleeding. Pathology showed endometrium hyperplasia, but a well differentiated neoplasm can not be excluded. She was seen by GynOnc Dr.Secord today and she will get a hysterotomy next week. She also reports recently newly diagnosed DM for which she was started on Metformin and she is having some GI side effects and her appetite is decreased. She has had unintentional weight loss as well.  Patient otherwise feeling well. She smokes 1 pack of cigarettes/day for about 35 years. She lives at home with daughter.   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  Saw pulmonology Dr.Fleming recently for dyspnea, COPD, on albuterol inhaler.  Lung nodules, <15m. Dr.Flemming recommend repeat CT in 4- 6 months.    Smoking cessation is discussed and she is not interested.  Check JAK2 with reflex to CARL/MPL Continue Aspirin 823mdaily.  Hold phlebotomy if Hct<50    INTERVAL HISTORY [age].o. female presents for follow up of elevated blood count.  Continues to smoke.  Chronic shortness of breath, stable.  No new complaints.  + hot flashes.    Review of Systems  Constitutional: Positive for fatigue. Negative for appetite change, chills, fever and unexpected weight change.  HENT:   Negative for hearing loss and voice change.   Eyes: Negative for eye problems.   Respiratory: Negative for chest tightness and cough.   Cardiovascular: Negative for chest pain.  Gastrointestinal: Negative for abdominal distention, abdominal pain and blood in stool.  Endocrine: Negative for hot flashes.  Genitourinary: Negative for difficulty urinating and frequency.   Musculoskeletal: Negative for arthralgias.  Skin: Negative for itching and rash.  Neurological: Negative for extremity weakness.  Hematological: Negative for adenopathy.  Psychiatric/Behavioral: Negative for confusion.    MEDICAL HISTORY: Past Medical History:  Diagnosis Date  . Arthritis    KNEES  . Blood dyscrasia    POLYCYTHEMIA  . Depression    H/O  . Diabetes mellitus [place] complication ([id number] Type 2-NEWLY DX  . Fatty liver   . GERD (gastroesophageal reflux disease)    OCC  . Heart murmur    ASYMPTOMATIC    SURGICAL HISTORY: Past Surgical History:  Procedure Laterality Date  . CESAREAN SECTION    . COLONOSCOPY WITH PROPOFOL N/A 08/08/2017   Procedure: COLONOSCOPY WITH PROPOFOL;  Surgeon: ElManya SilvasMD;  [facility];  Service: Endoscopy;  Laterality: N/A;  . ESOPHAGOGASTRODUODENOSCOPY (EGD) WITH PROPOFOL N/A 08/08/2017   Procedure: ESOPHAGOGASTRODUODENOSCOPY (EGD) WITH PROPOFOL;  Surgeon: ElManya SilvasMD;  [facility];  Service: Endoscopy;  Laterality: N/A;  . KNEE SURGERY     [facility]  . LAPAROSCOPIC HYSTERECTOMY Bilateral 01/22/2017   Procedure: HYSTERECTOMY TOTAL LAPAROSCOPIC BSO;  Surgeon: StMalachy MoodMD;  Location: [facility]S;  Service: Gynecology;  Laterality: Bilateral;    SOCIAL HISTORY: Social History   Socioeconomic History  . Marital status: Divorced    Spouse name: Not on file  .  Number of children: Not on file  . Years of education: Not on file  . Highest education level: Not on file  Occupational History  . Not on file  Social Needs  . Financial resource strain: Not on file  . Food insecurity    Worry: Not on  file    Inability: Not on file  . Transportation needs    Medical: Not on file    Non-medical: Not on file  Tobacco Use  . Smoking status: Current Every Day Smoker    Packs/day: 2.00    Years: 30.00    Pack years: 60.00    Types: Cigarettes  . Smokeless tobacco: Never Used  Substance and Sexual Activity  . Alcohol use: No  . Drug use: No  . Sexual activity: Not Currently  Lifestyle  . Physical activity    Days per week: Not on file    Minutes per session: Not on file  . Stress: Not on file  Relationships  . Social Herbalist on phone: Not on file    Gets together: Not on file    Attends religious service: Not on file    Active member of club or organization: Not on file    Attends meetings of clubs or organizations: Not on file    Relationship status: Not on file  . Intimate partner violence    Fear of current or ex partner: Not on file    Emotionally abused: Not on file    Physically abused: Not on file    Forced sexual activity: Not on file  Other Topics Concern  . Not on file  Social History Narrative  . Not on file    FAMILY HISTORY Family History  Problem Relation Age of Onset  . Diabetes Mother   . Diabetes Father   . Diabetes Sister   . Breast Wong Neg Hx     ALLERGIES:  is allergic to penicillin g.  MEDICATIONS:  Current Outpatient Medications  Medication Sig Dispense Refill  . Albuterol Sulfate 108 (90 Base) MCG/ACT AEPB Inhale 2 Inhalers into the lungs every 6 (six) hours as needed.    . benzonatate (TESSALON) 200 MG capsule TAKE 1 CAPSULE BY MOUTH THREE TIMES DAILY AS NEEDED FOR COUGH FOR UP TO 7 DAYS    . cetirizine (ZYRTEC) 10 MG tablet Take 20 mg by mouth daily.    . fluticasone (FLONASE) 50 MCG/ACT nasal spray Place 2 sprays into both nostrils daily as needed for allergies or rhinitis.    Marland Kitchen ibuprofen (ADVIL,MOTRIN) 600 MG tablet Take 1 tablet (600 mg total) by mouth every 6 (six) hours as needed for headache, moderate pain or  cramping. 60 tablet 1  . metFORMIN (GLUCOPHAGE) 500 MG tablet Take 1 tablet (500 mg total) by mouth 2 (two) times daily with a meal. 60 tablet 0  . omeprazole (PRILOSEC) 40 MG capsule TAKE 1 CAPSULE BY MOUTH ONCE DAILY 30 MINUTES BEFORE BREAKFAST    . PARoxetine (PAXIL) 10 MG tablet TAKE 1 & 1 2 (ONE & ONE HALF) TABLETS BY MOUTH ONCE DAILY    . predniSONE (DELTASONE) 20 MG tablet TAKE 1 TABLET BY MOUTH ONCE DAILY FOR 5 DAYS    . traZODone (DESYREL) 50 MG tablet Take 50 mg by mouth at bedtime.    . triamcinolone cream (KENALOG) 0.1 % APPLY CREAM EXTERNALLY TWICE DAILY    . nystatin (MYCOSTATIN/NYSTOP) powder Apply topically 2 (two) times daily. Until rash resolved. (Patient not taking: Reported  on 03/19/2019) 15 g 0  . oxyCODONE-acetaminophen (PERCOCET/ROXICET) 5-325 MG tablet Take 1-2 tablets by mouth every 4 (four) hours as needed (moderate to severe pain (when tolerating fluids)). (Patient not taking: Reported on 03/19/2019) 40 tablet 0  . terconazole (TERAZOL 3) 0.8 % vaginal cream Place 1 applicator vaginally at bedtime. (Patient not taking: Reported on 03/19/2019) 20 g 1   No current facility-administered medications for this visit.     PHYSICAL EXAMINATION:  ECOG PERFORMANCE STATUS: 1 - Symptomatic but completely ambulatory  Vitals:   03/19/19 1404  BP: 107/71  Pulse: 80  Resp: 18  Temp: 97.7 F (36.5 C)    Filed Weights   03/19/19 1404  Weight: 231 lb 14.4 oz (105.2 kg)    Physical Exam  Constitutional: She is oriented to person, place, and time. No distress.  Obese.   HENT:  Head: Normocephalic and atraumatic.  Nose: Nose normal.  Mouth/Throat: Oropharynx is clear and moist. No oropharyngeal exudate.  Eyes: Pupils are equal, round, and reactive to light. EOM are normal. No scleral icterus.  Neck: Normal range of motion. Neck supple.  Cardiovascular: Normal rate and regular rhythm.  No murmur heard. Pulmonary/Chest: Effort normal. No respiratory distress. She has no  rales. She exhibits no tenderness.  Decreased breath sounds bilaterally.   Abdominal: Soft. She exhibits no distension. There is no abdominal tenderness.  Musculoskeletal: Normal range of motion.        General: No edema.     Comments: Left lower extremity compression stocking.   Neurological: She is alert and oriented to person, place, and time. No cranial nerve deficit. She exhibits normal muscle tone. Coordination normal.  Skin: Skin is warm and dry. She is not diaphoretic. No erythema.  Psychiatric: Affect normal.    LABORATORY DATA: I have personally reviewed the data as listed: CBC Latest Ref Rng & Units 03/19/2019 01/08/2019 04/06/2018  WBC 4.0 - 10.5 K/uL 11.6(H) 10.6(H) 21.4(H)  Hemoglobin 12.0 - 15.0 g/dL 16.0(H) 15.4(H) 18.5(H)  Hematocrit 36.0 - 46.0 % 47.9(H) 45.8 54.5(H)  Platelets 150 - 400 K/uL 359 358 393   CMP Latest Ref Rng & Units 01/08/2019 04/06/2018 01/23/2017  Glucose 70 - 99 mg/dL 113(H) 137(H) 161(H)  BUN 6 - 20 mg/dL 12 22(H) 10  Creatinine 0.44 - 1.00 mg/dL 0.59 0.59 0.45  Sodium 135 - 145 mmol/L 140 138 139  Potassium 3.5 - 5.1 mmol/L 3.6 3.5 3.8  Chloride 98 - 111 mmol/L 104 99 107  CO2 22 - 32 mmol/L 26 25 25   Calcium 8.9 - 10.3 mg/dL 8.7(L) 9.6 8.5(L)  Total Protein 6.5 - 8.1 g/dL 7.1 - -  Total Bilirubin 0.3 - 1.2 mg/dL 0.5 - -  Alkaline Phos 38 - 126 U/L 96 - -  AST 15 - 41 U/L 41 - -  ALT 0 - 44 U/L 46(H) - -   RADIOGRAPHIC STUDIES: I have personally reviewed the radiological images as listed and agreed with the findings in the report. No results found.   ASSESSMENT/PLAN 1. Polycythemia, secondary   2. Tobacco abuse counseling   Labs are reviewed and discussed with patient.  Erythrocytosis is trending up, secondary erythrocytosis, due to smoking, She is also at risk of sleep apnea due to morbid obesity.  No need for phlebotomy.   Lung nodules, follows up with Dr.Fleming, repeat CT chest wo contrast showed stable lung nodules.  Recommend  12 months follow up; Overdue for mammogram. Discussed.   life style modification discussed with patient. Encourage  exercise and healthy diet.  Follow up in 4 months.     Earlie Server, MD  03/19/2019 11:20 PM

## 2019-04-28 DIAGNOSIS — R194 Change in bowel habit: Secondary | ICD-10-CM | POA: Diagnosis not present

## 2019-04-28 DIAGNOSIS — K760 Fatty (change of) liver, not elsewhere classified: Secondary | ICD-10-CM | POA: Diagnosis not present

## 2019-04-28 DIAGNOSIS — K7400 Hepatic fibrosis, unspecified: Secondary | ICD-10-CM | POA: Diagnosis not present

## 2019-04-28 DIAGNOSIS — R748 Abnormal levels of other serum enzymes: Secondary | ICD-10-CM | POA: Diagnosis not present

## 2019-04-29 DIAGNOSIS — R194 Change in bowel habit: Secondary | ICD-10-CM | POA: Diagnosis not present

## 2019-05-19 ENCOUNTER — Other Ambulatory Visit: Payer: Self-pay | Admitting: Student

## 2019-05-19 DIAGNOSIS — R748 Abnormal levels of other serum enzymes: Secondary | ICD-10-CM

## 2019-05-19 DIAGNOSIS — K760 Fatty (change of) liver, not elsewhere classified: Secondary | ICD-10-CM

## 2019-05-19 DIAGNOSIS — K7400 Hepatic fibrosis, unspecified: Secondary | ICD-10-CM

## 2019-05-20 ENCOUNTER — Telehealth: Payer: Self-pay | Admitting: *Deleted

## 2019-05-20 DIAGNOSIS — Z03818 Encounter for observation for suspected exposure to other biological agents ruled out: Secondary | ICD-10-CM | POA: Diagnosis not present

## 2019-05-20 DIAGNOSIS — R197 Diarrhea, unspecified: Secondary | ICD-10-CM | POA: Diagnosis not present

## 2019-05-27 DIAGNOSIS — E119 Type 2 diabetes mellitus without complications: Secondary | ICD-10-CM | POA: Diagnosis not present

## 2019-05-27 DIAGNOSIS — Z6841 Body Mass Index (BMI) 40.0 and over, adult: Secondary | ICD-10-CM | POA: Diagnosis not present

## 2019-05-27 DIAGNOSIS — I10 Essential (primary) hypertension: Secondary | ICD-10-CM | POA: Diagnosis not present

## 2019-05-27 DIAGNOSIS — F17210 Nicotine dependence, cigarettes, uncomplicated: Secondary | ICD-10-CM | POA: Diagnosis not present

## 2019-06-10 DIAGNOSIS — J40 Bronchitis, not specified as acute or chronic: Secondary | ICD-10-CM | POA: Diagnosis not present

## 2019-06-24 ENCOUNTER — Other Ambulatory Visit: Payer: Self-pay | Admitting: Specialist

## 2019-06-24 DIAGNOSIS — R918 Other nonspecific abnormal finding of lung field: Secondary | ICD-10-CM

## 2019-07-16 ENCOUNTER — Other Ambulatory Visit: Payer: Self-pay

## 2019-07-16 DIAGNOSIS — D751 Secondary polycythemia: Secondary | ICD-10-CM

## 2019-07-19 ENCOUNTER — Encounter: Payer: Self-pay | Admitting: Oncology

## 2019-07-19 ENCOUNTER — Other Ambulatory Visit: Payer: Self-pay

## 2019-07-19 ENCOUNTER — Inpatient Hospital Stay (HOSPITAL_BASED_OUTPATIENT_CLINIC_OR_DEPARTMENT_OTHER): Payer: BC Managed Care – PPO | Admitting: Oncology

## 2019-07-19 ENCOUNTER — Inpatient Hospital Stay: Payer: BC Managed Care – PPO | Attending: Oncology

## 2019-07-19 VITALS — BP 121/77 | HR 89 | Temp 98.2°F | Resp 18 | Wt 231.3 lb

## 2019-07-19 DIAGNOSIS — R918 Other nonspecific abnormal finding of lung field: Secondary | ICD-10-CM

## 2019-07-19 DIAGNOSIS — Z716 Tobacco abuse counseling: Secondary | ICD-10-CM

## 2019-07-19 DIAGNOSIS — F329 Major depressive disorder, single episode, unspecified: Secondary | ICD-10-CM | POA: Diagnosis not present

## 2019-07-19 DIAGNOSIS — F17210 Nicotine dependence, cigarettes, uncomplicated: Secondary | ICD-10-CM | POA: Insufficient documentation

## 2019-07-19 DIAGNOSIS — J449 Chronic obstructive pulmonary disease, unspecified: Secondary | ICD-10-CM | POA: Insufficient documentation

## 2019-07-19 DIAGNOSIS — F32A Depression, unspecified: Secondary | ICD-10-CM

## 2019-07-19 DIAGNOSIS — D751 Secondary polycythemia: Secondary | ICD-10-CM | POA: Diagnosis not present

## 2019-07-19 LAB — COMPREHENSIVE METABOLIC PANEL
ALT: 66 U/L — ABNORMAL HIGH (ref 0–44)
AST: 53 U/L — ABNORMAL HIGH (ref 15–41)
Albumin: 3.9 g/dL (ref 3.5–5.0)
Alkaline Phosphatase: 95 U/L (ref 38–126)
Anion gap: 11 (ref 5–15)
BUN: 13 mg/dL (ref 6–20)
CO2: 22 mmol/L (ref 22–32)
Calcium: 8.9 mg/dL (ref 8.9–10.3)
Chloride: 103 mmol/L (ref 98–111)
Creatinine, Ser: 0.44 mg/dL (ref 0.44–1.00)
GFR calc Af Amer: >60 mL/min (ref >60)
GFR calc non Af Amer: >60 mL/min (ref >60)
Glucose, Bld: 193 mg/dL — ABNORMAL HIGH (ref 70–99)
Potassium: 3.5 mmol/L (ref 3.5–5.1)
Sodium: 136 mmol/L (ref 135–145)
Total Bilirubin: 0.4 mg/dL (ref 0.3–1.2)
Total Protein: 7.3 g/dL (ref 6.5–8.1)

## 2019-07-19 LAB — CBC WITH DIFFERENTIAL/PLATELET
Abs Immature Granulocytes: 0.03 10*3/uL (ref 0.00–0.07)
Basophils Absolute: 0 10*3/uL (ref 0.0–0.1)
Basophils Relative: 0 %
Eosinophils Absolute: 0.2 10*3/uL (ref 0.0–0.5)
Eosinophils Relative: 2 %
HCT: 49.4 % — ABNORMAL HIGH (ref 36.0–46.0)
Hemoglobin: 16.2 g/dL — ABNORMAL HIGH (ref 12.0–15.0)
Immature Granulocytes: 0 %
Lymphocytes Relative: 38 %
Lymphs Abs: 4.4 10*3/uL — ABNORMAL HIGH (ref 0.7–4.0)
MCH: 30.2 pg (ref 26.0–34.0)
MCHC: 32.8 g/dL (ref 30.0–36.0)
MCV: 92 fL (ref 80.0–100.0)
Monocytes Absolute: 0.5 10*3/uL (ref 0.1–1.0)
Monocytes Relative: 4 %
Neutro Abs: 6.3 10*3/uL (ref 1.7–7.7)
Neutrophils Relative %: 56 %
Platelets: 360 10*3/uL (ref 150–400)
RBC: 5.37 MIL/uL — ABNORMAL HIGH (ref 3.87–5.11)
RDW: 12.9 % (ref 11.5–15.5)
WBC: 11.4 10*3/uL — ABNORMAL HIGH (ref 4.0–10.5)
nRBC: 0 % (ref 0.0–0.2)

## 2019-07-19 NOTE — Progress Notes (Addendum)
[facility] follow up visit  Patient Care Team: [place] as PCP - General  CHIEF COMPLAINTS/PURPOSE OF CONSULTATION: Re-establish care for elevated hemoglobin.    HISTORY OF PRESENTING ILLNESS: Kristen Wong 54 y.o. female with PMH listed as below was referred by he primary care provider Dr.Singh for evaluation of high hemoglobin.  Patient is accompanied by her daughter and sister to the clinic visit. She recently has D&C for evaluation of postmenopausal bleeding. Pathology showed endometrium hyperplasia, but a well differentiated neoplasm can not be excluded. She was seen by GynOnc Dr.Secord today and she will get a hysterotomy next week. She also reports recently newly diagnosed DM for which she was started on Metformin and she is having some GI side effects and her appetite is decreased. She has had unintentional weight loss as well.  Patient otherwise feeling well. She smokes 1 pack of cigarettes/day for about 35 years. She lives at home with daughter.   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  Saw pulmonology Dr.Fleming recently for dyspnea, COPD, on albuterol inhaler.  Lung nodules, <55m. Dr.Flemming recommend repeat CT in 4- 6 months.    Smoking cessation is discussed and she is not interested.  Check JAK2 with reflex to CARL/MPL Continue Aspirin 843mdaily.  Hold phlebotomy if Hct<50    INTERVAL HISTORY [age].o. female presents for follow up of elevated blood count.  Patient continues to smoke 2 packs of cigarettes daily. She feels that she is depressed recently due to Covid pandemic as well as her mom passed about a year ago and she feels that she has not recovered from the grief.  She denies any thoughts of hurting herself. She has some stomach issue followed by gastroenterology.  No new  concerns.  Review of Systems  Constitutional: Positive for fatigue. Negative for appetite change, chills, fever and unexpected weight change.  HENT:   Negative for hearing loss and voice change.   Eyes: Negative for eye problems.  Respiratory: Negative for chest tightness and cough.   Cardiovascular: Negative for chest pain.  Gastrointestinal: Negative for abdominal distention, abdominal pain and blood in stool.  Endocrine: Negative for hot flashes.  Genitourinary: Negative for difficulty urinating and frequency.   Musculoskeletal: Negative for arthralgias.  Skin: Negative for itching and rash.  Neurological: Negative for extremity weakness.  Hematological: Negative for adenopathy.  Psychiatric/Behavioral: Negative for confusion.    MEDICAL HISTORY: Past Medical History:  Diagnosis Date  . Arthritis    KNEES  . Blood dyscrasia    POLYCYTHEMIA  . Depression    H/O  . Diabetes mellitus without complication ([id number] Type 2-NEWLY DX  . Fatty liver   . GERD (gastroesophageal reflux disease)    OCC  . Heart murmur    ASYMPTOMATIC    SURGICAL HISTORY: Past Surgical History:  Procedure Laterality Date  . CESAREAN SECTION    . COLONOSCOPY WITH PROPOFOL N/A 08/08/2017   Procedure: COLONOSCOPY WITH PROPOFOL;  Surgeon: ElManya SilvasMD;  [facility];  Service: Endoscopy;  Laterality: N/A;  . ESOPHAGOGASTRODUODENOSCOPY (EGD) WITH PROPOFOL N/A 08/08/2017   Procedure: ESOPHAGOGASTRODUODENOSCOPY (EGD) WITH PROPOFOL;  Surgeon: ElManya SilvasMD;  [facility];  Service: Endoscopy;  Laterality: N/A;  . KNEE SURGERY     [facility]  . LAPAROSCOPIC HYSTERECTOMY Bilateral 01/22/2017   Procedure: HYSTERECTOMY TOTAL  LAPAROSCOPIC BSO;  Surgeon: Malachy Mood, MD;  Location: [facility];  Service: Gynecology;  Laterality: Bilateral;    SOCIAL HISTORY: Social History   Socioeconomic History  . Marital status: Divorced    Spouse name: Not on file  . Number of  children: Not on file  . Years of education: Not on file  . Highest education level: Not on file  Occupational History  . Not on file  Tobacco Use  . Smoking status: Current Every Day Smoker    Packs/day: 2.00    Years: 30.00    Pack years: 60.00    Types: Cigarettes  . Smokeless tobacco: Never Used  Substance and Sexual Activity  . Alcohol use: No  . Drug use: No  . Sexual activity: Not Currently  Other Topics Concern  . Not on file  Social History Narrative  . Not on file   Social Determinants of Health   Financial Resource Strain:   . Difficulty of Paying Living Expenses: Not on file  Food Insecurity:   . Worried About Charity fundraiser in the Last Year: Not on file  . Ran Out of Food in the Last Year: Not on file  Transportation Needs:   . Lack of Transportation (Medical): Not on file  . Lack of Transportation (Non-Medical): Not on file  Physical Activity:   . Days of Exercise per Week: Not on file  . Minutes of Exercise per Session: Not on file  Stress:   . Feeling of Stress : Not on file  Social Connections:   . Frequency of Communication with Friends and Family: Not on file  . Frequency of Social Gatherings with Friends and Family: Not on file  . Attends Religious Services: Not on file  . Active Member of Clubs or Organizations: Not on file  . Attends Archivist Meetings: Not on file  . Marital Status: Not on file  Intimate Partner Violence:   . Fear of Current or Ex-Partner: Not on file  . Emotionally Abused: Not on file  . Physically Abused: Not on file  . Sexually Abused: Not on file    FAMILY HISTORY Family History  Problem Relation Age of Onset  . Diabetes Mother   . Diabetes Father   . Diabetes Sister   . Breast cancer Neg Hx     ALLERGIES:  is allergic to penicillin g.  MEDICATIONS:  Current Outpatient Medications  Medication Sig Dispense Refill  . Albuterol Sulfate 108 (90 Base) MCG/ACT AEPB Inhale 2 Inhalers into the lungs  every 6 (six) hours as needed.    . benzonatate (TESSALON) 200 MG capsule TAKE 1 CAPSULE BY MOUTH THREE TIMES DAILY AS NEEDED FOR COUGH FOR UP TO 7 DAYS    . cetirizine (ZYRTEC) 10 MG tablet Take 20 mg by mouth daily.    . ferrous sulfate 325 (65 FE) MG tablet Take 325 mg by mouth daily.    . fluticasone (FLONASE) 50 MCG/ACT nasal spray Place 2 sprays into both nostrils daily as needed for allergies or rhinitis.    . hyoscyamine (LEVBID) 0.375 MG 12 hr tablet SMARTSIG:1 Tablet(s) By Mouth Every 12 Hours PRN    . ibuprofen (ADVIL,MOTRIN) 600 MG tablet Take 1 tablet (600 mg total) by mouth every 6 (six) hours as needed for headache, moderate pain or cramping. 60 tablet 1  . metFORMIN (GLUCOPHAGE) 500 MG tablet Take 1 tablet (500 mg total) by mouth 2 (two) times daily with a meal. 60 tablet 0  .  metoprolol tartrate (LOPRESSOR) 25 MG tablet Take 25 mg by mouth 2 (two) times daily.    Marland Kitchen omeprazole (PRILOSEC) 40 MG capsule TAKE 1 CAPSULE BY MOUTH ONCE DAILY 30 MINUTES BEFORE BREAKFAST    . PARoxetine (PAXIL) 10 MG tablet TAKE 1 & 1 2 (ONE & ONE HALF) TABLETS BY MOUTH ONCE DAILY    . traZODone (DESYREL) 50 MG tablet Take 50 mg by mouth at bedtime.    Marland Kitchen nystatin (MYCOSTATIN/NYSTOP) powder Apply topically 2 (two) times daily. Until rash resolved. (Patient not taking: Reported on 03/19/2019) 15 g 0  . oxyCODONE-acetaminophen (PERCOCET/ROXICET) 5-325 MG tablet Take 1-2 tablets by mouth every 4 (four) hours as needed (moderate to severe pain (when tolerating fluids)). (Patient not taking: Reported on 03/19/2019) 40 tablet 0  . predniSONE (DELTASONE) 20 MG tablet TAKE 1 TABLET BY MOUTH ONCE DAILY FOR 5 DAYS    . terconazole (TERAZOL 3) 0.8 % vaginal cream Place 1 applicator vaginally at bedtime. (Patient not taking: Reported on 03/19/2019) 20 g 1  . triamcinolone cream (KENALOG) 0.1 % APPLY CREAM EXTERNALLY TWICE DAILY     No current facility-administered medications for this visit.    PHYSICAL  EXAMINATION:  ECOG PERFORMANCE STATUS: 1 - Symptomatic but completely ambulatory  Vitals:   07/19/19 1319  BP: 121/77  Pulse: 89  Resp: 18  Temp: 98.2 F (36.8 C)    Filed Weights   07/19/19 1319  Weight: 231 lb 4.8 oz (104.9 kg)    Physical Exam  Constitutional: She is oriented to person, place, and time. No distress.  Obese.   HENT:  Head: Normocephalic and atraumatic.  Nose: Nose normal.  Mouth/Throat: Oropharynx is clear and moist. No oropharyngeal exudate.  Eyes: Pupils are equal, round, and reactive to light. EOM are normal. No scleral icterus.  Cardiovascular: Normal rate and regular rhythm.  No murmur heard. Pulmonary/Chest: Effort normal. No respiratory distress. She has no rales. She exhibits no tenderness.  Decreased breath sounds bilaterally.   Abdominal: Soft. She exhibits no distension. There is no abdominal tenderness.  Musculoskeletal:        General: No edema. Normal range of motion.     Cervical back: Normal range of motion and neck supple.     Comments: Left lower extremity compression stocking.   Neurological: She is alert and oriented to person, place, and time. No cranial nerve deficit. She exhibits normal muscle tone. Coordination normal.  Skin: Skin is warm and dry. She is not diaphoretic. No erythema.  Psychiatric: Affect normal.    LABORATORY DATA: I have personally reviewed the data as listed: CBC Latest Ref Rng & Units 07/19/2019 03/19/2019 01/08/2019  WBC 4.0 - 10.5 K/uL 11.4(H) 11.6(H) 10.6(H)  Hemoglobin 12.0 - 15.0 g/dL 16.2(H) 16.0(H) 15.4(H)  Hematocrit 36.0 - 46.0 % 49.4(H) 47.9(H) 45.8  Platelets 150 - 400 K/uL 360 359 358   CMP Latest Ref Rng & Units 07/19/2019 01/08/2019 04/06/2018  Glucose 70 - 99 mg/dL 193(H) 113(H) 137(H)  BUN 6 - 20 mg/dL 13 12 22(H)  Creatinine 0.44 - 1.00 mg/dL 0.44 0.59 0.59  Sodium 135 - 145 mmol/L 136 140 138  Potassium 3.5 - 5.1 mmol/L 3.5 3.6 3.5  Chloride 98 - 111 mmol/L 103 104 99  CO2 22 - 32 mmol/L  22 26 25   Calcium 8.9 - 10.3 mg/dL 8.9 8.7(L) 9.6  Total Protein 6.5 - 8.1 g/dL 7.3 7.1 -  Total Bilirubin 0.3 - 1.2 mg/dL 0.4 0.5 -  Alkaline Phos 38 -  126 U/L 95 96 -  AST 15 - 41 U/L 53(H) 41 -  ALT 0 - 44 U/L 66(H) 46(H) -   RADIOGRAPHIC STUDIES: I have personally reviewed the radiological images as listed and agreed with the findings in the report. No results found.   ASSESSMENT/PLAN 1. Polycythemia, secondary   2. Tobacco abuse counseling   3. Lung nodules   4. Depression, unspecified depression type    #Secondary polycythemia, due to smoking. Labs reviewed and discussed with patient. Continue to have increased hemoglobin 16.2, hematocrit 49.4. I discussed about starting phlebotomy if hematocrit reaches above 50.  She agrees with the plan. Today her hematocrit is borderline, close to the cutoff of phlebotomy.  I discussed about phlebotomy 300 cc in light of likely increase in the near future given her continuation of heavy smoking.  She declined. Tobacco abuse, continues to smoke 2 packs daily Discussed about smoke cessation.  She expresses feeling of being depressed, not interested in doing anything. Depression: Discussed with patient to further discuss with primary care provider for adjusting anti-depressant.  ? On Paxil.  Lung nodule, follow-up with Dr. Raul Del with plan of repeat CT scan in the next few months.  Follow up in 3 months.     Earlie Server, MD  07/19/2019 4:24 PM

## 2019-07-19 NOTE — Progress Notes (Signed)
Pt here for follow up. Pt reports having stomach issues but is being followed by GI. No other concerns voiced.

## 2019-09-14 ENCOUNTER — Ambulatory Visit: Payer: BC Managed Care – PPO | Admitting: Oncology

## 2019-09-14 ENCOUNTER — Other Ambulatory Visit: Payer: BC Managed Care – PPO

## 2019-10-15 ENCOUNTER — Inpatient Hospital Stay: Payer: BC Managed Care – PPO

## 2019-10-15 ENCOUNTER — Inpatient Hospital Stay: Payer: BC Managed Care – PPO | Admitting: Oncology

## 2019-11-02 ENCOUNTER — Inpatient Hospital Stay: Payer: BC Managed Care – PPO

## 2019-11-02 ENCOUNTER — Encounter: Payer: Self-pay | Admitting: Oncology

## 2019-11-02 ENCOUNTER — Inpatient Hospital Stay (HOSPITAL_BASED_OUTPATIENT_CLINIC_OR_DEPARTMENT_OTHER): Payer: BC Managed Care – PPO | Admitting: Oncology

## 2019-11-02 ENCOUNTER — Inpatient Hospital Stay: Payer: BC Managed Care – PPO | Attending: Oncology

## 2019-11-02 ENCOUNTER — Other Ambulatory Visit: Payer: Self-pay

## 2019-11-02 VITALS — BP 134/54 | HR 78 | Temp 98.4°F | Wt 233.2 lb

## 2019-11-02 DIAGNOSIS — F17210 Nicotine dependence, cigarettes, uncomplicated: Secondary | ICD-10-CM | POA: Insufficient documentation

## 2019-11-02 DIAGNOSIS — D751 Secondary polycythemia: Secondary | ICD-10-CM

## 2019-11-02 DIAGNOSIS — R918 Other nonspecific abnormal finding of lung field: Secondary | ICD-10-CM | POA: Diagnosis not present

## 2019-11-02 DIAGNOSIS — Z716 Tobacco abuse counseling: Secondary | ICD-10-CM | POA: Diagnosis not present

## 2019-11-02 LAB — CBC WITH DIFFERENTIAL/PLATELET
Abs Immature Granulocytes: 0.04 10*3/uL (ref 0.00–0.07)
Basophils Absolute: 0 10*3/uL (ref 0.0–0.1)
Basophils Relative: 0 %
Eosinophils Absolute: 0.3 10*3/uL (ref 0.0–0.5)
Eosinophils Relative: 2 %
HCT: 44.9 % (ref 36.0–46.0)
Hemoglobin: 15.7 g/dL — ABNORMAL HIGH (ref 12.0–15.0)
Immature Granulocytes: 0 %
Lymphocytes Relative: 41 %
Lymphs Abs: 5.5 10*3/uL — ABNORMAL HIGH (ref 0.7–4.0)
MCH: 31.3 pg (ref 26.0–34.0)
MCHC: 35 g/dL (ref 30.0–36.0)
MCV: 89.4 fL (ref 80.0–100.0)
Monocytes Absolute: 0.8 10*3/uL (ref 0.1–1.0)
Monocytes Relative: 6 %
Neutro Abs: 6.7 10*3/uL (ref 1.7–7.7)
Neutrophils Relative %: 51 %
Platelets: 360 10*3/uL (ref 150–400)
RBC: 5.02 MIL/uL (ref 3.87–5.11)
RDW: 13.3 % (ref 11.5–15.5)
WBC: 13.3 10*3/uL — ABNORMAL HIGH (ref 4.0–10.5)
nRBC: 0 % (ref 0.0–0.2)

## 2019-11-02 NOTE — Progress Notes (Signed)
Patient here for follow up. No new concerns voiced. Pt complains of stomach issue and is being followed by GI Tammi Klippel).

## 2019-11-02 NOTE — Progress Notes (Signed)
[facility] follow up visit  Patient Care Team: [place] as PCP - General  Reason of visit  Follow-up for erythrocytosis   HISTORY OF PRESENTING ILLNESS: Kristen Wong 54 y.o. 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  Saw pulmonology Dr.Fleming recently for dyspnea, COPD, on albuterol inhaler.  Lung nodules, <35m. Dr.Flemming recommend repeat CT in 4- 6 months.   Smoking cessation is discussed and she is not interested.  JAK2 with reflex to CARL/MPL is negative. Continue Aspirin 860mdaily.  Phlebotomy threshold is hematocrit 50 and above   INTERVAL HISTORY [age].o. female presents for follow up of elevated blood count.  Patient reports feeling better recently.  She is less stressed and she smokes less cigarettes She has no new complaints.  Chronic fatigue unchanged. Review of Systems  Constitutional: Positive for fatigue. Negative for appetite change, chills, fever and unexpected weight change.  HENT:   Negative for hearing loss and voice change.   Eyes: Negative for eye problems.  Respiratory: Negative for chest tightness and cough.   Cardiovascular: Negative for chest pain.  Gastrointestinal: Negative for abdominal distention, abdominal pain and blood in stool.  Endocrine: Negative for hot flashes.  Genitourinary: Negative for difficulty urinating and frequency.   Musculoskeletal: Negative for arthralgias.  Skin: Negative for itching and rash.  Neurological: Negative for extremity weakness.  Hematological: Negative for adenopathy.  Psychiatric/Behavioral: Negative for confusion.    MEDICAL HISTORY: Past Medical History:  Diagnosis Date  . Arthritis    KNEES  . Blood dyscrasia    POLYCYTHEMIA  . Depression    H/O  . Diabetes mellitus without complication ([id number] Type 2-NEWLY DX  . Fatty liver   . GERD (gastroesophageal reflux disease)    OCC  . Heart murmur    ASYMPTOMATIC    SURGICAL HISTORY: Past Surgical History:  Procedure Laterality Date  . CESAREAN SECTION    . COLONOSCOPY WITH PROPOFOL N/A 08/08/2017   Procedure: COLONOSCOPY WITH PROPOFOL;  Surgeon: ElManya SilvasMD;  [facility];  Service: Endoscopy;  Laterality: N/A;  . ESOPHAGOGASTRODUODENOSCOPY (EGD) WITH PROPOFOL N/A 08/08/2017   Procedure: ESOPHAGOGASTRODUODENOSCOPY (EGD) WITH PROPOFOL;  Surgeon: ElManya SilvasMD;  [facility];  Service: Endoscopy;  Laterality: N/A;  . KNEE SURGERY     [facility]  . LAPAROSCOPIC HYSTERECTOMY Bilateral 01/22/2017   Procedure: HYSTERECTOMY TOTAL LAPAROSCOPIC BSO;  Surgeon: StMalachy MoodMD;  Location: [facility];  Service: Gynecology;  Laterality: Bilateral;    SOCIAL HISTORY: Social History   Socioeconomic History  . Marital status: Divorced    Spouse name: Not on file  . Number of children: Not on file  . Years of education: Not on file  . Highest education level: Not on file  Occupational History  . Not on file  Tobacco Use  . Smoking status: Current Every Day Smoker    Packs/day: 2.00    Years: 30.00    Pack years: 60.00    Types: Cigarettes  . Smokeless tobacco: Never Used  Substance and Sexual Activity  . Alcohol use: No  . Drug use: No  . Sexual activity: Not Currently  Other Topics Concern  . Not on file  Social History Narrative  . Not on file   Social Determinants of Health  Financial Resource Strain:   . Difficulty of Paying Living Expenses:   Food Insecurity:   . Worried About Charity fundraiser in the Last Year:   . Arboriculturist in the Last Year:   Transportation Needs:   . Film/video editor (Medical):   Marland Kitchen Lack of Transportation (Non-Medical):   Physical Activity:   . Days of Exercise per Week:   . Minutes of  Exercise per Session:   Stress:   . Feeling of Stress :   Social Connections:   . Frequency of Communication with Friends and Family:   . Frequency of Social Gatherings with Friends and Family:   . Attends Religious Services:   . Active Member of Clubs or Organizations:   . Attends Archivist Meetings:   Marland Kitchen Marital Status:   Intimate Partner Violence:   . Fear of Current or Ex-Partner:   . Emotionally Abused:   Marland Kitchen Physically Abused:   . Sexually Abused:     FAMILY HISTORY Family History  Problem Relation Age of Onset  . Diabetes Mother   . Diabetes Father   . Diabetes Sister   . Breast cancer Neg Hx     ALLERGIES:  is allergic to penicillin g.  MEDICATIONS:  Current Outpatient Medications  Medication Sig Dispense Refill  . Albuterol Sulfate 108 (90 Base) MCG/ACT AEPB Inhale 2 Inhalers into the lungs every 6 (six) hours as needed.    . benzonatate (TESSALON) 200 MG capsule TAKE 1 CAPSULE BY MOUTH THREE TIMES DAILY AS NEEDED FOR COUGH FOR UP TO 7 DAYS    . cetirizine (ZYRTEC) 10 MG tablet Take 20 mg by mouth daily.    Marland Kitchen dicyclomine (BENTYL) 10 MG capsule Take 10-20 mg by mouth every 6 (six) hours as needed.    . ferrous sulfate 325 (65 FE) MG tablet Take 325 mg by mouth daily.    Marland Kitchen ibuprofen (ADVIL,MOTRIN) 600 MG tablet Take 1 tablet (600 mg total) by mouth every 6 (six) hours as needed for headache, moderate pain or cramping. 60 tablet 1  . metFORMIN (GLUCOPHAGE) 500 MG tablet Take 1 tablet (500 mg total) by mouth 2 (two) times daily with a meal. 60 tablet 0  . metoprolol tartrate (LOPRESSOR) 25 MG tablet Take 25 mg by mouth 2 (two) times daily.    Marland Kitchen omeprazole (PRILOSEC) 40 MG capsule TAKE 1 CAPSULE BY MOUTH ONCE DAILY 30 MINUTES BEFORE BREAKFAST    . PARoxetine (PAXIL) 10 MG tablet TAKE 1 & 1 2 (ONE & ONE HALF) TABLETS BY MOUTH ONCE DAILY    . traZODone (DESYREL) 50 MG tablet Take 50 mg by mouth at bedtime.    . fluticasone (FLONASE) 50 MCG/ACT nasal spray Place 2  sprays into both nostrils daily as needed for allergies or rhinitis.    . hyoscyamine (LEVBID) 0.375 MG 12 hr tablet SMARTSIG:1 Tablet(s) By Mouth Every 12 Hours PRN    . nystatin (MYCOSTATIN/NYSTOP) powder Apply topically 2 (two) times daily. Until rash resolved. (Patient not taking: Reported on 03/19/2019) 15 g 0  . oxyCODONE-acetaminophen (PERCOCET/ROXICET) 5-325 MG tablet Take 1-2 tablets by mouth every 4 (four) hours as needed (moderate to severe pain (when tolerating fluids)). (Patient not taking: Reported on 03/19/2019) 40 tablet 0  . predniSONE (DELTASONE) 20 MG tablet TAKE 1 TABLET BY MOUTH ONCE DAILY FOR 5 DAYS    . terconazole (TERAZOL 3) 0.8 % vaginal cream Place 1 applicator vaginally at bedtime. (Patient not taking: Reported on 03/19/2019)  20 g 1  . triamcinolone cream (KENALOG) 0.1 % APPLY CREAM EXTERNALLY TWICE DAILY     No current facility-administered medications for this visit.    PHYSICAL EXAMINATION:  ECOG PERFORMANCE STATUS: 1 - Symptomatic but completely ambulatory  Vitals:   11/02/19 1305  BP: (!) 134/54  Pulse: 78  Temp: 98.4 F (36.9 C)    Filed Weights   11/02/19 1305  Weight: 233 lb 3.2 oz (105.8 kg)    Physical Exam  Constitutional: She is oriented to person, place, and time. No distress.  Obese.   HENT:  Head: Normocephalic and atraumatic.  Nose: Nose normal.  Mouth/Throat: Oropharynx is clear and moist. No oropharyngeal exudate.  Eyes: Pupils are equal, round, and reactive to light. EOM are normal. No scleral icterus.  Cardiovascular: Normal rate and regular rhythm.  No murmur heard. Pulmonary/Chest: Effort normal. No respiratory distress. She has no rales. She exhibits no tenderness.  Decreased breath sounds bilaterally.   Abdominal: Soft. She exhibits no distension. There is no abdominal tenderness.  Musculoskeletal:        General: No edema. Normal range of motion.     Cervical back: Normal range of motion and neck supple.  Neurological: She  is alert and oriented to person, place, and time. No cranial nerve deficit. She exhibits normal muscle tone. Coordination normal.  Skin: Skin is warm and dry. She is not diaphoretic. No erythema.  Psychiatric: Affect normal.    LABORATORY DATA: I have personally reviewed the data as listed: CBC Latest Ref Rng & Units 11/02/2019 07/19/2019 03/19/2019  WBC 4.0 - 10.5 K/uL 13.3(H) 11.4(H) 11.6(H)  Hemoglobin 12.0 - 15.0 g/dL 15.7(H) 16.2(H) 16.0(H)  Hematocrit 36.0 - 46.0 % 44.9 49.4(H) 47.9(H)  Platelets 150 - 400 K/uL 360 360 359   CMP Latest Ref Rng & Units 07/19/2019 01/08/2019 04/06/2018  Glucose 70 - 99 mg/dL 193(H) 113(H) 137(H)  BUN 6 - 20 mg/dL 13 12 22(H)  Creatinine 0.44 - 1.00 mg/dL 0.44 0.59 0.59  Sodium 135 - 145 mmol/L 136 140 138  Potassium 3.5 - 5.1 mmol/L 3.5 3.6 3.5  Chloride 98 - 111 mmol/L 103 104 99  CO2 22 - 32 mmol/L 22 26 25   Calcium 8.9 - 10.3 mg/dL 8.9 8.7(L) 9.6  Total Protein 6.5 - 8.1 g/dL 7.3 7.1 -  Total Bilirubin 0.3 - 1.2 mg/dL 0.4 0.5 -  Alkaline Phos 38 - 126 U/L 95 96 -  AST 15 - 41 U/L 53(H) 41 -  ALT 0 - 44 U/L 66(H) 46(H) -   RADIOGRAPHIC STUDIES: I have personally reviewed the radiological images as listed and agreed with the findings in the report. No results found.   ASSESSMENT/PLAN 1. Polycythemia, secondary   2. Tobacco abuse counseling   3. Lung nodules    #Secondary polycythemia, due to smoking. Labs reviewed and discussed with patient. Hemoglobin has improved likely secondary to the decrease of smoking. Hold off phlebotomy at this point.  Tobacco use, I encouraged her efforts of smoking cessation. Lung nodules, patient has an appointment with pulmonologist Dr. Raul Del in July.  I encourage patient to contact Dr. Gust Brooms office and get CT chest follow-up done prior to her visit.  Follow up in 6 months.     Earlie Server, MD  11/02/2019 3:58 PM

## 2020-03-01 ENCOUNTER — Other Ambulatory Visit: Payer: Self-pay | Admitting: Student

## 2020-03-01 DIAGNOSIS — K760 Fatty (change of) liver, not elsewhere classified: Secondary | ICD-10-CM

## 2020-03-01 DIAGNOSIS — K7400 Hepatic fibrosis, unspecified: Secondary | ICD-10-CM

## 2020-03-01 DIAGNOSIS — R748 Abnormal levels of other serum enzymes: Secondary | ICD-10-CM

## 2020-04-05 ENCOUNTER — Other Ambulatory Visit: Payer: Self-pay

## 2020-04-05 ENCOUNTER — Ambulatory Visit
Admission: RE | Admit: 2020-04-05 | Discharge: 2020-04-05 | Disposition: A | Discharge status: Home / Self Care | Payer: BC Managed Care – PPO | Source: Ambulatory Visit | Attending: Student | Admitting: Student

## 2020-04-05 DIAGNOSIS — R748 Abnormal levels of other serum enzymes: Secondary | ICD-10-CM | POA: Insufficient documentation

## 2020-04-05 DIAGNOSIS — K7400 Hepatic fibrosis, unspecified: Secondary | ICD-10-CM | POA: Insufficient documentation

## 2020-04-05 DIAGNOSIS — K760 Fatty (change of) liver, not elsewhere classified: Secondary | ICD-10-CM | POA: Diagnosis present

## 2020-05-01 ENCOUNTER — Inpatient Hospital Stay: Payer: BC Managed Care – PPO

## 2020-05-01 ENCOUNTER — Inpatient Hospital Stay: Payer: BC Managed Care – PPO | Admitting: Oncology

## 2020-06-19 ENCOUNTER — Telehealth: Payer: Self-pay | Admitting: Oncology

## 2020-06-19 NOTE — Telephone Encounter (Signed)
Ellison Hughs please call pt to r/s appt. Received 2 phone notes regarding pt wanting to cancel appt, looks like pt has called twice already.

## 2020-06-19 NOTE — Telephone Encounter (Signed)
Patient call to cancel appointment for 06/20/20

## 2020-06-19 NOTE — Telephone Encounter (Signed)
Patient called to cancel/reschedule appointment for 06/19/20 patient requested a call back.

## 2020-06-20 ENCOUNTER — Inpatient Hospital Stay: Payer: BC Managed Care – PPO

## 2020-06-20 ENCOUNTER — Inpatient Hospital Stay: Payer: BC Managed Care – PPO | Admitting: Oncology

## 2020-06-20 ENCOUNTER — Telehealth: Payer: Self-pay | Admitting: Oncology

## 2020-06-20 NOTE — Telephone Encounter (Signed)
Done..   Pts 06/20/20 appts were cx @ 9/35 per pts request

## 2020-06-20 NOTE — Telephone Encounter (Signed)
Done  Per pt request to cx her sched 06/20/20 lab/MD/Phleb appt. Pt stated that she would contact the office at a later date to have appt R/S

## 2020-06-20 NOTE — Telephone Encounter (Signed)
Patient left message with answering service to cancel appointment scheduled today 06/20/20 at 1:30pm.  Routing to scheduler for follow up.

## 2020-10-06 ENCOUNTER — Other Ambulatory Visit: Payer: Self-pay | Admitting: Gastroenterology

## 2021-01-04 ENCOUNTER — Other Ambulatory Visit: Payer: Self-pay | Admitting: Specialist

## 2021-01-04 DIAGNOSIS — J439 Emphysema, unspecified: Secondary | ICD-10-CM

## 2021-01-04 DIAGNOSIS — R0609 Other forms of dyspnea: Secondary | ICD-10-CM

## 2021-01-04 DIAGNOSIS — R0600 Dyspnea, unspecified: Secondary | ICD-10-CM

## 2021-01-11 ENCOUNTER — Encounter: Payer: Self-pay | Admitting: *Deleted

## 2021-01-12 ENCOUNTER — Ambulatory Visit: Payer: BC Managed Care – PPO | Admitting: Anesthesiology

## 2021-01-12 ENCOUNTER — Encounter: Payer: Self-pay | Admitting: *Deleted

## 2021-01-12 ENCOUNTER — Encounter
Admission: RE | Disposition: A | Discharge status: Home / Self Care | Payer: Self-pay | Source: Home / Self Care | Attending: Gastroenterology

## 2021-01-12 ENCOUNTER — Ambulatory Visit
Admission: RE | Admit: 2021-01-12 | Discharge: 2021-01-12 | Disposition: A | Discharge status: Home / Self Care | Payer: BC Managed Care – PPO | Attending: Gastroenterology | Admitting: Gastroenterology

## 2021-01-12 DIAGNOSIS — Z1211 Encounter for screening for malignant neoplasm of colon: Secondary | ICD-10-CM | POA: Insufficient documentation

## 2021-01-12 DIAGNOSIS — Z880 Allergy status to penicillin: Secondary | ICD-10-CM | POA: Diagnosis not present

## 2021-01-12 DIAGNOSIS — Z86010 Personal history of colonic polyps: Secondary | ICD-10-CM | POA: Diagnosis not present

## 2021-01-12 DIAGNOSIS — K219 Gastro-esophageal reflux disease without esophagitis: Secondary | ICD-10-CM | POA: Diagnosis not present

## 2021-01-12 DIAGNOSIS — Z7952 Long term (current) use of systemic steroids: Secondary | ICD-10-CM | POA: Insufficient documentation

## 2021-01-12 DIAGNOSIS — Z791 Long term (current) use of non-steroidal anti-inflammatories (NSAID): Secondary | ICD-10-CM | POA: Insufficient documentation

## 2021-01-12 DIAGNOSIS — Z79899 Other long term (current) drug therapy: Secondary | ICD-10-CM | POA: Insufficient documentation

## 2021-01-12 DIAGNOSIS — E119 Type 2 diabetes mellitus without complications: Secondary | ICD-10-CM | POA: Diagnosis not present

## 2021-01-12 DIAGNOSIS — Z5309 Procedure and treatment not carried out because of other contraindication: Secondary | ICD-10-CM | POA: Insufficient documentation

## 2021-01-12 DIAGNOSIS — Z7984 Long term (current) use of oral hypoglycemic drugs: Secondary | ICD-10-CM | POA: Insufficient documentation

## 2021-01-12 HISTORY — PX: COLONOSCOPY WITH PROPOFOL: SHX5780

## 2021-01-12 HISTORY — DX: Secondary polycythemia: D75.1

## 2021-01-12 HISTORY — DX: Chronic obstructive pulmonary disease, unspecified: J44.9

## 2021-01-12 LAB — GLUCOSE, CAPILLARY: Glucose-Capillary: 153 mg/dL — ABNORMAL HIGH (ref 70–99)

## 2021-01-12 SURGERY — COLONOSCOPY WITH PROPOFOL
Anesthesia: General

## 2021-01-12 MED ORDER — LIDOCAINE HCL (CARDIAC) PF 100 MG/5ML IV SOSY
PREFILLED_SYRINGE | INTRAVENOUS | Status: DC | PRN
Start: 2021-01-12 — End: 2021-01-12
  Administered 2021-01-12: 30 mg via INTRAVENOUS

## 2021-01-12 MED ORDER — LIDOCAINE HCL (PF) 2 % IJ SOLN
INTRAMUSCULAR | Status: AC
  Filled 2021-01-12: qty 5

## 2021-01-12 MED ORDER — PROPOFOL 10 MG/ML IV BOLUS
INTRAVENOUS | Status: DC | PRN
  Administered 2021-01-12: 100 mg via INTRAVENOUS

## 2021-01-12 MED ORDER — SODIUM CHLORIDE 0.9 % IV SOLN
INTRAVENOUS | Status: DC
  Administered 2021-01-12: 1000 mL via INTRAVENOUS

## 2021-01-12 MED ORDER — PROPOFOL 500 MG/50ML IV EMUL
INTRAVENOUS | Status: DC | PRN
  Administered 2021-01-12: 100 ug/kg/min via INTRAVENOUS

## 2021-01-12 MED ORDER — PROPOFOL 500 MG/50ML IV EMUL
INTRAVENOUS | Status: AC
  Filled 2021-01-12: qty 50

## 2021-01-12 NOTE — Anesthesia Postprocedure Evaluation (Signed)
Anesthesia Post Note  Patient: Kristen Wong  Procedure(s) Performed: COLONOSCOPY WITH PROPOFOL  Patient location during evaluation: Endoscopy Anesthesia Type: General Level of consciousness: awake and alert Pain management: pain level controlled Vital Signs Assessment: post-procedure vital signs reviewed and stable Respiratory status: spontaneous breathing, nonlabored ventilation, respiratory function stable and patient connected to nasal cannula oxygen Cardiovascular status: blood pressure returned to baseline and stable Postop Assessment: no apparent nausea or vomiting Anesthetic complications: no   No notable events documented.   Last Vitals:  Vitals:   01/12/21 1036 01/12/21 1046  BP: 129/87 (!) 151/73  Pulse: 79 72  Resp: 16 19  Temp:    SpO2: 95% 98%    Last Pain:  Vitals:   01/12/21 1046  TempSrc:   PainSc: 0-No pain                 Arita Miss

## 2021-01-12 NOTE — Interval H&P Note (Signed)
History and Physical Interval Note:  01/12/2021 10:07 AM  Kristen Wong  has presented today for surgery, with the diagnosis of history of adenomatous polyps of colon; chronic diarrhea.  The various methods of treatment have been discussed with the patient and family. After consideration of risks, benefits and other options for treatment, the patient has consented to  Procedure(s) with comments: COLONOSCOPY WITH PROPOFOL (N/A) - DM as a surgical intervention.  The patient's history has been reviewed, patient examined, no change in status, stable for surgery.  I have reviewed the patient's chart and labs.  Questions were answered to the patient's satisfaction.     [name]  Ok to proceed with colonoscopy

## 2021-01-12 NOTE — Anesthesia Preprocedure Evaluation (Signed)
Anesthesia Evaluation  Patient identified by MRN, date of birth, ID band Patient awake    Reviewed: Allergy & Precautions, H&P , NPO status , Patient's Chart, lab work & pertinent test results, reviewed documented beta blocker date and time   History of Anesthesia Complications Negative for: history of anesthetic complications  Airway Mallampati: III  TM Distance: >3 FB Neck ROM: full    Dental  (+) Poor Dentition, Edentulous Upper   Pulmonary neg sleep apnea, COPD,  COPD inhaler, Current SmokerPatient did not abstain from smoking.,     + decreased breath sounds      Cardiovascular Exercise Tolerance: Poor METS(-) hypertension(-) CAD and (-) Past MI Normal cardiovascular exam(-) dysrhythmias + Valvular Problems/Murmurs  Rhythm:regular Rate:Normal     Neuro/Psych PSYCHIATRIC DISORDERS Depression negative neurological ROS     GI/Hepatic Neg liver ROS, GERD  Medicated,  Endo/Other  diabetes, Poorly Controlled, Type 2, Oral Hypoglycemic Agents  Renal/GU negative Renal ROS  negative genitourinary   Musculoskeletal   Abdominal   Peds  Hematology  (+) Blood dyscrasia, ,   Anesthesia Other Findings Past Medical History: No date: Arthritis     Comment:  KNEES No date: Blood dyscrasia     Comment:  POLYCYTHEMIA No date: Depression     Comment:  H/O 2018: Diabetes mellitus without complication (HCC)     Comment:  Type 2-NEWLY DX No date: Fatty liver No date: GERD (gastroesophageal reflux disease)     Comment:  OCC No date: Heart murmur     Comment:  ASYMPTOMATIC Past Surgical History: No date: CESAREAN SECTION No date: KNEE SURGERY     Comment:  [facility] 01/22/2017: LAPAROSCOPIC HYSTERECTOMY; Bilateral     Comment:  Procedure: HYSTERECTOMY TOTAL LAPAROSCOPIC BSO;                Surgeon: Malachy Mood, MD;  Location: [facility] ORS;                Service: Gynecology;  Laterality: Bilateral; BMI    Body Mass Index:  37.08  kg/m     Reproductive/Obstetrics negative OB ROS                             Anesthesia Physical  Anesthesia Plan  ASA: 3  Anesthesia Plan: General   Post-op Pain Management:    Induction: Intravenous  PONV Risk Score and Plan: 2 and Ondansetron, Propofol infusion and TIVA  Airway Management Planned: Nasal Cannula  Additional Equipment: None  Intra-op Plan:   Post-operative Plan:   Informed Consent: I have reviewed the patients History and Physical, chart, labs and discussed the procedure including the risks, benefits and alternatives for the proposed anesthesia with the patient or authorized representative who has indicated his/her understanding and acceptance.     Dental Advisory Given  Plan Discussed with: CRNA  Anesthesia Plan Comments: (Discussed risks of anesthesia with patient, including possibility of difficulty with spontaneous ventilation under anesthesia necessitating airway intervention, PONV, and rare risks such as cardiac or respiratory or neurological events, and allergic reactions. Patient understands.)        Anesthesia Quick Evaluation

## 2021-01-12 NOTE — Op Note (Signed)
[facility] Patient Name: Kristen Wong Procedure Date: 01/12/2021 10:00 AM MRN: [id number] Account #: [id number] Date of Birth: [birth year] Admit Type: Inpatient Age: 55 Room: [facility] ENDO ROOM 3 Gender: Female Note Status: Finalized Procedure:             Colonoscopy Indications:           High risk colon cancer surveillance: Personal history                         of sessile serrated colon polyp (less than 10 mm in                         size) with no dysplasia Providers:             Andrey Farmer MD, MD Medicines:             Monitored Anesthesia Care Complications:         No immediate complications. Estimated blood loss:                         Minimal. Procedure:             Pre-Anesthesia Assessment:                        - Prior to the procedure, a History and Physical was                         performed, and patient medications and allergies were                         reviewed. The patient is competent. The risks and                         benefits of the procedure and the sedation options and                         risks were discussed with the patient. All questions                         were answered and informed consent was obtained.                         Patient identification and proposed procedure were                         verified by the physician, the nurse, the anesthetist                         and the technician in the endoscopy suite. Mental                         Status Examination: alert and oriented. Airway                         Examination: normal oropharyngeal airway and neck                         mobility. Respiratory Examination: clear to  auscultation. CV Examination: normal. Prophylactic                         Antibiotics: The patient does not require prophylactic                         antibiotics. Prior Anticoagulants: The patient has                         taken no  previous anticoagulant or antiplatelet                         agents. ASA Grade Assessment: III - A patient with                         severe systemic disease. After reviewing the risks and                         benefits, the patient was deemed in satisfactory                         condition to undergo the procedure. The anesthesia                         plan was to use monitored anesthesia care (MAC).                         Immediately prior to administration of medications,                         the patient was re-assessed for adequacy to receive                         sedatives. The heart rate, respiratory rate, oxygen                         saturations, blood pressure, adequacy of pulmonary                         ventilation, and response to care were monitored                         throughout the procedure. The physical status of the                         patient was re-assessed after the procedure.                        After obtaining informed consent, the colonoscope was                         passed under direct vision. Throughout the procedure,                         the patient's blood pressure, pulse, and oxygen                         saturations were monitored continuously. The  Colonoscope was introduced through the anus with the                         intention of advancing to the cecum. The scope was                         advanced to the descending colon before the procedure                         was aborted. Medications were given. The colonoscopy                         was aborted due to poor bowel prep. The patient                         tolerated the procedure well. The quality of the bowel                         preparation was poor. Findings:      The perianal and digital rectal examinations were normal.      Semi-solid stool was found in the rectum, in the sigmoid colon and in       the descending colon, making  visualization difficult.      Biopsies for histology were taken with a cold forceps from the right       colon for evaluation of microscopic colitis. Estimated blood loss was       minimal. Impression:            - The procedure was aborted due to poor bowel prep.                        - Preparation of the colon was poor.                        - Stool in the rectum, in the sigmoid colon and in the                         descending colon.                        - Biopsies were taken with a cold forceps from the                         right colon for evaluation of microscopic colitis. Recommendation:        - Discharge patient to home.                        - Resume previous diet.                        - Continue present medications.                        - Await pathology results.                        - Repeat colonoscopy at next available appointment                         (  within 3 months) because the bowel preparation was                         suboptimal. Procedure Code(s):     --- Professional ---                        [phone], 74, Colonoscopy, flexible; with biopsy, single                         or multiple Diagnosis Code(s):     --- Professional ---                        Z86.010, Personal history of colonic polyps CPT copyright 2019 [employer]. All rights reserved. The codes documented in this report are preliminary and upon coder review may  be revised to meet current compliance requirements. Andrey Farmer MD, MD 01/12/2021 10:25:45 AM Number of Addenda: 0 Note Initiated On: 01/12/2021 10:00 AM Total Procedure Duration: 0 hours 6 minutes 41 seconds  Estimated Blood Loss:  Estimated blood loss was minimal.      [facility]

## 2021-01-12 NOTE — Transfer of Care (Signed)
Immediate Anesthesia Transfer of Care Note  Patient: Kristen Wong  Procedure(s) Performed: COLONOSCOPY WITH PROPOFOL  Patient Location: PACU and Endoscopy Unit  Anesthesia Type:General  Level of Consciousness: awake  Airway & Oxygen Therapy: Patient Spontanous Breathing  Post-op Assessment: Report given to RN  Post vital signs: stable  Last Vitals:  Vitals Value Taken Time  BP 136/82 01/12/21 1026  Temp 36 C 01/12/21 1026  Pulse 81 01/12/21 1026  Resp 28 01/12/21 1026  SpO2 97 % 01/12/21 1026    Last Pain:  Vitals:   01/12/21 1026  TempSrc: Temporal  PainSc: Asleep         Complications: No notable events documented.

## 2021-01-12 NOTE — H&P (Signed)
Outpatient short stay form Pre-procedure 01/12/2021 10:05 AM Kristen Miyamoto MD, MPH  Primary Physician: [facility]  Reason for visit:  Hx of polyps  History of present illness:   55 y/o lady with history of multiple hyperplastic polyps and possible SSA on colonoscopy done 3 year ago. No family history of GI malignancies. History of hysterectomy. No blood thinners. Has had chronic diarrhea for years.    Current Facility-Administered Medications:    0.9 %  sodium chloride infusion, , Intravenous, Continuous, Capers Hagmann, Hilton Cork, MD, Last Rate: 20 mL/hr at 01/12/21 1003, Continued from Pre-op at 01/12/21 1003  Medications Prior to Admission  Medication Sig Dispense Refill Last Dose   cetirizine (ZYRTEC) 10 MG tablet Take 20 mg by mouth daily.   01/11/2021   dicyclomine (BENTYL) 10 MG capsule Take 10-20 mg by mouth every 6 (six) hours as needed.   01/11/2021   etodolac (LODINE) 400 MG tablet Take 400 mg by mouth 2 (two) times daily.    at prn   fluticasone (FLONASE) 50 MCG/ACT nasal spray Place 2 sprays into both nostrils daily as needed for allergies or rhinitis.   01/11/2021   HYDROcodone bit-homatropine (HYCODAN) 5-1.5 MG/5ML syrup Take 5 mLs by mouth every 6 (six) hours as needed for cough.    at prn   metFORMIN (GLUCOPHAGE) 500 MG tablet Take 1 tablet (500 mg total) by mouth 2 (two) times daily with a meal. 60 tablet 0 Past Week   metoprolol tartrate (LOPRESSOR) 25 MG tablet Take 25 mg by mouth 2 (two) times daily.   Past Week   Multiple Vitamin (MULTIVITAMIN) capsule Take 1 capsule by mouth daily.   Past Week   omeprazole (PRILOSEC) 40 MG capsule TAKE 1 CAPSULE BY MOUTH ONCE DAILY 30 MINUTES BEFORE BREAKFAST   Past Week   oxyCODONE-acetaminophen (PERCOCET/ROXICET) 5-325 MG tablet Take 1-2 tablets by mouth every 4 (four) hours as needed (moderate to severe pain (when tolerating fluids)). 40 tablet 0 Past Week   PARoxetine (PAXIL) 10 MG tablet TAKE 1 & 1 2 (ONE & ONE HALF) TABLETS BY MOUTH  ONCE DAILY   Past Week   traZODone (DESYREL) 50 MG tablet Take 50 mg by mouth at bedtime.   Past Week   triamcinolone cream (KENALOG) 0.1 % APPLY CREAM EXTERNALLY TWICE DAILY   Past Week   Albuterol Sulfate 108 (90 Base) MCG/ACT AEPB Inhale 2 Inhalers into the lungs every 6 (six) hours as needed.    at prn   benzonatate (TESSALON) 200 MG capsule TAKE 1 CAPSULE BY MOUTH THREE TIMES DAILY AS NEEDED FOR COUGH FOR UP TO 7 DAYS      ferrous sulfate 325 (65 FE) MG tablet Take 325 mg by mouth daily. (Patient not taking: Reported on 01/12/2021)   Not Taking   hyoscyamine (LEVBID) 0.375 MG 12 hr tablet SMARTSIG:1 Tablet(s) By Mouth Every 12 Hours PRN (Patient not taking: Reported on 01/12/2021)   Not Taking   ibuprofen (ADVIL,MOTRIN) 600 MG tablet Take 1 tablet (600 mg total) by mouth every 6 (six) hours as needed for headache, moderate pain or cramping. 60 tablet 1    nystatin (MYCOSTATIN/NYSTOP) powder Apply topically 2 (two) times daily. Until rash resolved. (Patient not taking: Reported on 03/19/2019) 15 g 0    predniSONE (DELTASONE) 20 MG tablet TAKE 1 TABLET BY MOUTH ONCE DAILY FOR 5 DAYS      terconazole (TERAZOL 3) 0.8 % vaginal cream Place 1 applicator vaginally at bedtime. (Patient not taking: Reported on 03/19/2019)  20 g 1      Allergies  Allergen Reactions   Penicillin G Nausea Only    Has patient had a PCN reaction causing immediate rash, facial/tongue/throat swelling, SOB or lightheadedness with hypotension: No Has patient had a PCN reaction causing severe rash involving mucus membranes or skin necrosis: No Has patient had a PCN reaction that required hospitalization: No Has patient had a PCN reaction occurring within the last 10 years: No If all of the above answers are "NO", then may proceed with Cephalosporin use.      Past Medical History:  Diagnosis Date   Arthritis    KNEES   Blood dyscrasia    POLYCYTHEMIA   COPD (chronic obstructive pulmonary disease) (HCC)    Depression     H/O   Diabetes mellitus without complication ([place]) 4199   Type 2-NEWLY DX   Fatty liver    GERD (gastroesophageal reflux disease)    OCC   Heart murmur    ASYMPTOMATIC   Polycythemia     Review of systems:  Otherwise negative.    Physical Exam  Gen: Alert, oriented. Appears stated age.  HEENT: PERRLA. Lungs: No respiratory distress CV: RRR Abd: soft, benign, no masses Ext: No edema    Planned procedures: Proceed with colonoscopy. The patient understands the nature of the planned procedure, indications, risks, alternatives and potential complications including but not limited to bleeding, infection, perforation, damage to internal organs and possible oversedation/side effects from anesthesia. The patient agrees and gives consent to proceed.  Please refer to procedure notes for findings, recommendations and patient disposition/instructions.     Kristen Miyamoto MD, MPH Gastroenterology 01/12/2021  10:05 AM

## 2021-01-15 ENCOUNTER — Encounter: Payer: Self-pay | Admitting: Gastroenterology

## 2021-01-16 LAB — SURGICAL PATHOLOGY

## 2021-05-11 ENCOUNTER — Ambulatory Visit: Payer: BC Managed Care – PPO | Admitting: Certified Registered Nurse Anesthetist

## 2021-05-11 ENCOUNTER — Ambulatory Visit
Admission: RE | Admit: 2021-05-11 | Discharge: 2021-05-11 | Disposition: A | Discharge status: Home / Self Care | Payer: BC Managed Care – PPO | Source: Ambulatory Visit | Attending: Gastroenterology | Admitting: Gastroenterology

## 2021-05-11 ENCOUNTER — Encounter
Admission: RE | Disposition: A | Discharge status: Home / Self Care | Payer: Self-pay | Source: Ambulatory Visit | Attending: Gastroenterology

## 2021-05-11 DIAGNOSIS — F17200 Nicotine dependence, unspecified, uncomplicated: Secondary | ICD-10-CM | POA: Insufficient documentation

## 2021-05-11 DIAGNOSIS — K219 Gastro-esophageal reflux disease without esophagitis: Secondary | ICD-10-CM | POA: Diagnosis not present

## 2021-05-11 DIAGNOSIS — K640 First degree hemorrhoids: Secondary | ICD-10-CM | POA: Diagnosis not present

## 2021-05-11 DIAGNOSIS — I10 Essential (primary) hypertension: Secondary | ICD-10-CM | POA: Insufficient documentation

## 2021-05-11 DIAGNOSIS — J449 Chronic obstructive pulmonary disease, unspecified: Secondary | ICD-10-CM | POA: Insufficient documentation

## 2021-05-11 DIAGNOSIS — K529 Noninfective gastroenteritis and colitis, unspecified: Secondary | ICD-10-CM | POA: Insufficient documentation

## 2021-05-11 DIAGNOSIS — Z86010 Personal history of colonic polyps: Secondary | ICD-10-CM | POA: Diagnosis not present

## 2021-05-11 DIAGNOSIS — Z79899 Other long term (current) drug therapy: Secondary | ICD-10-CM | POA: Diagnosis not present

## 2021-05-11 DIAGNOSIS — E119 Type 2 diabetes mellitus without complications: Secondary | ICD-10-CM | POA: Diagnosis not present

## 2021-05-11 DIAGNOSIS — Z7984 Long term (current) use of oral hypoglycemic drugs: Secondary | ICD-10-CM | POA: Diagnosis not present

## 2021-05-11 HISTORY — PX: COLONOSCOPY WITH PROPOFOL: SHX5780

## 2021-05-11 LAB — GLUCOSE, CAPILLARY: Glucose-Capillary: 156 mg/dL — ABNORMAL HIGH (ref 70–99)

## 2021-05-11 SURGERY — COLONOSCOPY WITH PROPOFOL
Anesthesia: General

## 2021-05-11 MED ORDER — PROPOFOL 500 MG/50ML IV EMUL
INTRAVENOUS | Status: DC | PRN
  Administered 2021-05-11: 125 ug/kg/min via INTRAVENOUS

## 2021-05-11 MED ORDER — PROPOFOL 10 MG/ML IV BOLUS
INTRAVENOUS | Status: DC | PRN
Start: 2021-05-11 — End: 2021-05-11
  Administered 2021-05-11: 40 mg via INTRAVENOUS

## 2021-05-11 MED ORDER — LIDOCAINE HCL (CARDIAC) PF 100 MG/5ML IV SOSY
PREFILLED_SYRINGE | INTRAVENOUS | Status: DC | PRN
  Administered 2021-05-11: 50 mg via INTRAVENOUS

## 2021-05-11 MED ORDER — SODIUM CHLORIDE 0.9 % IV SOLN
INTRAVENOUS | Status: DC
  Administered 2021-05-11: 20 mL/h via INTRAVENOUS

## 2021-05-11 NOTE — Interval H&P Note (Signed)
History and Physical Interval Note:  05/11/2021 8:12 AM  Kristen Wong  has presented today for surgery, with the diagnosis of Chronic diarrhea of unknown origin, unspecified (K52.9) History of adenomatous polyp of colon (Z86.010).  The various methods of treatment have been discussed with the patient and family. After consideration of risks, benefits and other options for treatment, the patient has consented to  Procedure(s) with comments: COLONOSCOPY WITH PROPOFOL (N/A) - DM as a surgical intervention.  The patient's history has been reviewed, patient examined, no change in status, stable for surgery.  I have reviewed the patient's chart and labs.  Questions were answered to the patient's satisfaction.     [name]  Ok to proceed with colonoscopy

## 2021-05-11 NOTE — Op Note (Signed)
[facility]enter[facility] Patient Name: Kristen Wong Procedure Date: 05/11/2021 7:55 AM MRN: [id number] Account #: [id number] Date of Birth: May 09, 1966 Admit Type: Outpatient Age: 55 Room: [facility] ENDO ROOM 3 Gender: Female Note Status: Finalized Instrument Name: Jasper Riling [id number] Procedure:             Colonoscopy Indications:           High risk colon cancer surveillance: Personal history                         of Serrated Polyposis Syndrome Providers:             Andrey Farmer MD, MD Referring MD:          [facility] Medicines:             Monitored Anesthesia Care Complications:         No immediate complications. Procedure:             Pre-Anesthesia Assessment:                        - Prior to the procedure, a History and Physical was                         performed, and patient medications and allergies were                         reviewed. The patient is competent. The risks and                         benefits of the procedure and the sedation options and                         risks were discussed with the patient. All questions                         were answered and informed consent was obtained.                         Patient identification and proposed procedure were                         verified by the physician, the nurse, the anesthetist                         and the technician in the endoscopy suite. Mental                         Status Examination: alert and oriented. Airway                         Examination: normal oropharyngeal airway and neck                         mobility. Respiratory Examination: clear to                         auscultation. CV Examination: normal. Prophylactic  Antibiotics: The patient does not require prophylactic                         antibiotics. Prior Anticoagulants: The patient has                         taken no previous anticoagulant or antiplatelet                          agents. ASA Grade Assessment: III - A patient with                         severe systemic disease. After reviewing the risks and                         benefits, the patient was deemed in satisfactory                         condition to undergo the procedure. The anesthesia                         plan was to use monitored anesthesia care (MAC).                         Immediately prior to administration of medications,                         the patient was re-assessed for adequacy to receive                         sedatives. The heart rate, respiratory rate, oxygen                         saturations, blood pressure, adequacy of pulmonary                         ventilation, and response to care were monitored                         throughout the procedure. The physical status of the                         patient was re-assessed after the procedure.                        After obtaining informed consent, the colonoscope was                         passed under direct vision. Throughout the procedure,                         the patient's blood pressure, pulse, and oxygen                         saturations were monitored continuously. The                         Colonoscope was introduced through the anus and  advanced to the the cecum, identified by appendiceal                         orifice and ileocecal valve. The colonoscopy was                         performed without difficulty. The patient tolerated                         the procedure well. The quality of the bowel                         preparation was fair. Findings:      The perianal and digital rectal examinations were normal.      Internal hemorrhoids were found during retroflexion. The hemorrhoids       were Grade I (internal hemorrhoids that do not prolapse).      The exam was otherwise without abnormality on direct and retroflexion       views. Impression:            -  Preparation of the colon was fair.                        - Internal hemorrhoids.                        - The examination was otherwise normal on direct and                         retroflexion views.                        - No specimens collected. Recommendation:        - Discharge patient to home.                        - Resume previous diet.                        - Continue present medications.                        - Repeat colonoscopy in 1-2 years for surveillance.                        - Return to referring physician as previously                         scheduled. Procedure Code(s):     --- Professional ---                        Z3007, Colorectal cancer screening; colonoscopy on                         individual at high risk Diagnosis Code(s):     --- Professional ---                        Z86.010, Personal history of colonic polyps  K64.0, First degree hemorrhoids CPT copyright 2019 American Medical Association. All rights reserved. The codes documented in this report are preliminary and upon coder review may  be revised to meet current compliance requirements. Andrey Farmer MD, MD 05/11/2021 8:37:07 AM Number of Addenda: 0 Note Initiated On: 05/11/2021 7:55 AM Scope Withdrawal Time: 0 hours 8 minutes 53 seconds  Total Procedure Duration: 0 hours 14 minutes 44 seconds  Estimated Blood Loss:  Estimated blood loss: none.      [facility]

## 2021-05-11 NOTE — Anesthesia Postprocedure Evaluation (Signed)
Anesthesia Post Note  Patient: Kristen Wong  Procedure(s) Performed: COLONOSCOPY WITH PROPOFOL  Patient location during evaluation: PACU Anesthesia Type: General Level of consciousness: awake and oriented Pain management: pain level controlled Vital Signs Assessment: post-procedure vital signs reviewed and stable Respiratory status: spontaneous breathing and respiratory function stable Cardiovascular status: blood pressure returned to baseline Anesthetic complications: no   No notable events documented.   Last Vitals:  Vitals:   05/11/21 0837 05/11/21 0847  BP: (!) 111/52 (!) 121/47  Pulse: 72 73  Resp: 19 17  Temp:    SpO2: 96% 97%    Last Pain:  Vitals:   05/11/21 0847  TempSrc:   PainSc: 0-No pain                 VAN STAVEREN,Katrinka Herbison

## 2021-05-11 NOTE — Anesthesia Preprocedure Evaluation (Signed)
Anesthesia Evaluation  Patient identified by MRN, date of birth, ID band Patient awake    Reviewed: Allergy & Precautions, NPO status , Patient's Chart, lab work & pertinent test results  Airway Mallampati: II  TM Distance: >3 FB Neck ROM: full    Dental  (+) Edentulous Upper Lower teeth in very poor shape!:   Pulmonary neg pulmonary ROS, COPD,  COPD inhaler, Current Smoker and Patient abstained from smoking.,    Pulmonary exam normal  + decreased breath sounds      Cardiovascular Exercise Tolerance: Poor hypertension, Pt. on medications negative cardio ROS Normal cardiovascular exam Rhythm:Regular Rate:Normal     Neuro/Psych negative neurological ROS  negative psych ROS   GI/Hepatic negative GI ROS, Neg liver ROS, GERD  ,  Endo/Other  negative endocrine ROSdiabetes, Well Controlled, Type 2, Oral Hypoglycemic Agents  Renal/GU negative Renal ROS  negative genitourinary   Musculoskeletal  (+) Arthritis ,   Abdominal (+) + obese,   Peds negative pediatric ROS (+)  Hematology negative hematology ROS (+)   Anesthesia Other Findings Past Medical History: No date: Arthritis     Comment:  KNEES No date: Blood dyscrasia     Comment:  POLYCYTHEMIA No date: COPD (chronic obstructive pulmonary disease) (HCC) No date: Depression     Comment:  H/O 2018: Diabetes mellitus without complication (HCC)     Comment:  Type 2-NEWLY DX No date: Fatty liver No date: GERD (gastroesophageal reflux disease)     Comment:  OCC No date: Heart murmur     Comment:  ASYMPTOMATIC No date: Polycythemia  Past Surgical History: No date: ABDOMINAL HYSTERECTOMY No date: CESAREAN SECTION 08/08/2017: COLONOSCOPY WITH PROPOFOL; N/A     Comment:  Procedure: COLONOSCOPY WITH PROPOFOL;  Surgeon: Manya Silvas, MD;  Location: [facility] ENDOSCOPY;  Service:               Endoscopy;  Laterality: N/A; 01/12/2021: COLONOSCOPY WITH  PROPOFOL; N/A     Comment:  Procedure: COLONOSCOPY WITH PROPOFOL;  Surgeon:               [name], MD;  Location: [facility] ENDOSCOPY;                Service: Endoscopy;  Laterality: N/A;  DM No date: DIAGNOSTIC LAPAROSCOPY 08/08/2017: ESOPHAGOGASTRODUODENOSCOPY (EGD) WITH PROPOFOL; N/A     Comment:  Procedure: ESOPHAGOGASTRODUODENOSCOPY (EGD) WITH               PROPOFOL;  Surgeon: Manya Silvas, MD;  Location:               [facility] ENDOSCOPY;  Service: Endoscopy;  Laterality: N/A; No date: KNEE SURGERY     Comment:  [facility] 01/22/2017: LAPAROSCOPIC HYSTERECTOMY; Bilateral     Comment:  Procedure: HYSTERECTOMY TOTAL LAPAROSCOPIC BSO;                Surgeon: Malachy Mood, MD;  Location: [facility] ORS;                Service: Gynecology;  Laterality: Bilateral;  BMI    Body Mass Index: 39.65 kg/m      Reproductive/Obstetrics negative OB ROS                             Anesthesia Physical Anesthesia Plan  ASA: 3  Anesthesia Plan: General   Post-op Pain Management:  Induction: Intravenous  PONV Risk Score and Plan: Propofol infusion and TIVA  Airway Management Planned: Natural Airway and Nasal Cannula  Additional Equipment:   Intra-op Plan:   Post-operative Plan:   Informed Consent: I have reviewed the patients History and Physical, chart, labs and discussed the procedure including the risks, benefits and alternatives for the proposed anesthesia with the patient or authorized representative who has indicated his/her understanding and acceptance.     Dental Advisory Given  Plan Discussed with: CRNA and Surgeon  Anesthesia Plan Comments:         Anesthesia Quick Evaluation

## 2021-05-11 NOTE — H&P (Signed)
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 Had poor prep on last colonoscopy done in August. Has a history of hysterectomy. No blood thinners. No family history of GI malignancies.    Current Facility-Administered Medications:    0.9 %  sodium chloride infusion, , Intravenous, Continuous, Vonna Brabson, Hilton Cork, MD, Last Rate: 20 mL/hr at 05/11/21 0807, 20 mL/hr at 05/11/21 0807  Medications Prior to Admission  Medication Sig Dispense Refill Last Dose   ibuprofen (ADVIL,MOTRIN) 600 MG tablet Take 1 tablet (600 mg total) by mouth every 6 (six) hours as needed for headache, moderate pain or cramping. 60 tablet 1 Past Week   metFORMIN (GLUCOPHAGE) 500 MG tablet Take 1 tablet (500 mg total) by mouth 2 (two) times daily with a meal. 60 tablet 0 Past Week   metoprolol tartrate (LOPRESSOR) 25 MG tablet Take 25 mg by mouth 2 (two) times daily.   05/10/2021   Multiple Vitamin (MULTIVITAMIN) capsule Take 1 capsule by mouth daily.   05/10/2021   omeprazole (PRILOSEC) 40 MG capsule TAKE 1 CAPSULE BY MOUTH ONCE DAILY 30 MINUTES BEFORE BREAKFAST   05/10/2021   oxyCODONE-acetaminophen (PERCOCET/ROXICET) 5-325 MG tablet Take 1-2 tablets by mouth every 4 (four) hours as needed (moderate to severe pain (when tolerating fluids)). 40 tablet 0 05/10/2021   PARoxetine (PAXIL) 10 MG tablet TAKE 1 & 1 2 (ONE & ONE HALF) TABLETS BY MOUTH ONCE DAILY   05/10/2021   predniSONE (DELTASONE) 20 MG tablet TAKE 1 TABLET BY MOUTH ONCE DAILY FOR 5 DAYS   05/10/2021   traZODone (DESYREL) 50 MG tablet Take 50 mg by mouth at bedtime.   05/10/2021   triamcinolone cream (KENALOG) 0.1 % APPLY CREAM EXTERNALLY TWICE DAILY   05/10/2021   Albuterol Sulfate 108 (90 Base) MCG/ACT AEPB  Inhale 2 Inhalers into the lungs every 6 (six) hours as needed.      benzonatate (TESSALON) 200 MG capsule TAKE 1 CAPSULE BY MOUTH THREE TIMES DAILY AS NEEDED FOR COUGH FOR UP TO 7 DAYS      cetirizine (ZYRTEC) 10 MG tablet Take 20 mg by mouth daily.      dicyclomine (BENTYL) 10 MG capsule Take 10-20 mg by mouth every 6 (six) hours as needed.      etodolac (LODINE) 400 MG tablet Take 400 mg by mouth 2 (two) times daily.      ferrous sulfate 325 (65 FE) MG tablet Take 325 mg by mouth daily. (Patient not taking: Reported on 01/12/2021)      fluticasone (FLONASE) 50 MCG/ACT nasal spray Place 2 sprays into both nostrils daily as needed for allergies or rhinitis.      HYDROcodone bit-homatropine (HYCODAN) 5-1.5 MG/5ML syrup Take 5 mLs by mouth every 6 (six) hours as needed for cough.      hyoscyamine (LEVBID) 0.375 MG 12 hr tablet SMARTSIG:1 Tablet(s) By Mouth Every 12 Hours PRN (Patient not taking: Reported on 01/12/2021)      nystatin (MYCOSTATIN/NYSTOP) powder Apply topically 2 (two) times daily. Until rash resolved. (Patient not taking: Reported on 03/19/2019) 15 g 0    terconazole (TERAZOL 3) 0.8 % vaginal cream Place 1 applicator vaginally at bedtime. (Patient not taking: Reported on 03/19/2019) 20 g 1  Allergies  Allergen Reactions   Penicillin G Nausea Only    Has patient had a PCN reaction causing immediate rash, facial/tongue/throat swelling, SOB or lightheadedness with hypotension: No Has patient had a PCN reaction causing severe rash involving mucus membranes or skin necrosis: No Has patient had a PCN reaction that required hospitalization: No Has patient had a PCN reaction occurring within the last 10 years: No If all of the above answers are "NO", then may proceed with Cephalosporin use.      Past Medical History:  Diagnosis Date   Arthritis    KNEES   Blood dyscrasia    POLYCYTHEMIA   COPD (chronic obstructive pulmonary disease) (HCC)    Depression    H/O   Diabetes  mellitus without complication ([place]) 3606   Type 2-NEWLY DX   Fatty liver    GERD (gastroesophageal reflux disease)    OCC   Heart murmur    ASYMPTOMATIC   Polycythemia     Review of systems:  Otherwise negative.    Physical Exam  Gen: Alert, oriented. Appears stated age.  HEENT: PERRLA. Lungs: No respiratory distress CV: RRR Abd: soft, benign, no masses Ext: No edema    Planned procedures: Proceed with colonoscopy. The patient understands the nature of the planned procedure, indications, risks, alternatives and potential complications including but not limited to bleeding, infection, perforation, damage to internal organs and possible oversedation/side effects from anesthesia. The patient agrees and gives consent to proceed.  Please refer to procedure notes for findings, recommendations and patient disposition/instructions.     [name], MD [facility]

## 2021-05-11 NOTE — Transfer of Care (Signed)
Immediate Anesthesia Transfer of Care Note  Patient: Kristen Wong  Procedure(s) Performed: COLONOSCOPY WITH PROPOFOL  Patient Location: PACU  Anesthesia Type:General  Level of Consciousness: awake, alert  and oriented  Airway & Oxygen Therapy: Patient Spontanous Breathing and Patient connected to nasal cannula oxygen  Post-op Assessment: Report given to RN and Post -op Vital signs reviewed and stable  Post vital signs: Reviewed and stable  Last Vitals:  Vitals Value Taken Time  BP    Temp    Pulse    Resp    SpO2      Last Pain:  Vitals:   05/11/21 0746  TempSrc: Temporal         Complications: No notable events documented.

## 2021-05-14 ENCOUNTER — Encounter: Payer: Self-pay | Admitting: Gastroenterology

## 2021-07-18 ENCOUNTER — Other Ambulatory Visit: Payer: Self-pay | Admitting: Physician Assistant

## 2021-07-18 DIAGNOSIS — Z1231 Encounter for screening mammogram for malignant neoplasm of breast: Secondary | ICD-10-CM

## 2021-10-09 ENCOUNTER — Other Ambulatory Visit: Payer: Self-pay | Admitting: Gastroenterology

## 2021-10-09 DIAGNOSIS — R1084 Generalized abdominal pain: Secondary | ICD-10-CM

## 2021-10-09 DIAGNOSIS — K7400 Hepatic fibrosis, unspecified: Secondary | ICD-10-CM

## 2021-10-09 DIAGNOSIS — D72820 Lymphocytosis (symptomatic): Secondary | ICD-10-CM

## 2021-10-10 ENCOUNTER — Telehealth: Payer: Self-pay

## 2021-10-10 NOTE — Telephone Encounter (Signed)
-----   Message from Wallene Dales sent at 10/10/2021  2:28 PM EDT ----- ?Regarding: RE: [facility] ?Pt did not answer. Left VM to contact office for scheduling per MD recommendation. ? ?Brooke ? ?----- Message ----- ?From: Evelina Dun, RN ?Sent: 10/10/2021   2:25 PM EDT ?To: Johny Chess, MD, # ?Subject: RE: [facility]                      ? ?Since she has not seen Dr. Tasia Catchings in almost 2 years, please schedule MD then labs.  ?----- Message ----- ?From: Secundino Ginger ?Sent: 10/10/2021  10:05 AM EDT ?To: Evelina Dun, RN, Wallene Dales, # ?Subject: REFERRAL- [facility]                          ? ?REFERRAL- [facility] sent to the chart. This is an est. Patient with Dr Tasia Catchings. Last seen 11/02/2019. Referred by [facility] November for Leukocytosis. Dr Tasia Catchings please advise. ? ? ? ?

## 2021-10-31 ENCOUNTER — Other Ambulatory Visit: Payer: Self-pay | Admitting: *Deleted

## 2021-10-31 ENCOUNTER — Ambulatory Visit
Admission: RE | Admit: 2021-10-31 | Discharge: 2021-10-31 | Disposition: A | Discharge status: Home / Self Care | Payer: BC Managed Care – PPO | Source: Ambulatory Visit | Attending: Gastroenterology | Admitting: Gastroenterology

## 2021-10-31 DIAGNOSIS — Z122 Encounter for screening for malignant neoplasm of respiratory organs: Secondary | ICD-10-CM

## 2021-10-31 DIAGNOSIS — K7400 Hepatic fibrosis, unspecified: Secondary | ICD-10-CM | POA: Insufficient documentation

## 2021-10-31 DIAGNOSIS — D72820 Lymphocytosis (symptomatic): Secondary | ICD-10-CM | POA: Diagnosis present

## 2021-10-31 DIAGNOSIS — Z87891 Personal history of nicotine dependence: Secondary | ICD-10-CM

## 2021-10-31 DIAGNOSIS — R1084 Generalized abdominal pain: Secondary | ICD-10-CM | POA: Insufficient documentation

## 2021-10-31 DIAGNOSIS — F17210 Nicotine dependence, cigarettes, uncomplicated: Secondary | ICD-10-CM

## 2021-10-31 HISTORY — DX: Essential (primary) hypertension: I10

## 2021-10-31 LAB — POCT I-STAT CREATININE: Creatinine, Ser: 0.5 mg/dL (ref 0.44–1.00)

## 2021-10-31 MED ORDER — IOHEXOL 300 MG/ML  SOLN
100.0000 mL | Freq: Once | INTRAMUSCULAR | Status: AC | PRN
  Administered 2021-10-31: 100 mL via INTRAVENOUS

## 2021-11-16 ENCOUNTER — Inpatient Hospital Stay: Payer: BC Managed Care – PPO | Admitting: Oncology

## 2021-11-16 ENCOUNTER — Inpatient Hospital Stay: Payer: BC Managed Care – PPO

## 2021-11-23 ENCOUNTER — Encounter: Payer: Self-pay | Admitting: Acute Care

## 2021-11-23 ENCOUNTER — Ambulatory Visit (INDEPENDENT_AMBULATORY_CARE_PROVIDER_SITE_OTHER): Payer: BC Managed Care – PPO | Admitting: Acute Care

## 2021-11-23 ENCOUNTER — Ambulatory Visit
Admission: RE | Admit: 2021-11-23 | Discharge: 2021-11-23 | Disposition: A | Discharge status: Home / Self Care | Payer: BC Managed Care – PPO | Source: Ambulatory Visit | Attending: Acute Care | Admitting: Acute Care

## 2021-11-23 DIAGNOSIS — Z87891 Personal history of nicotine dependence: Secondary | ICD-10-CM | POA: Insufficient documentation

## 2021-11-23 DIAGNOSIS — Z122 Encounter for screening for malignant neoplasm of respiratory organs: Secondary | ICD-10-CM | POA: Diagnosis present

## 2021-11-23 DIAGNOSIS — F17210 Nicotine dependence, cigarettes, uncomplicated: Secondary | ICD-10-CM | POA: Diagnosis present

## 2021-11-23 NOTE — Patient Instructions (Signed)
Thank you for participating in the [facility]. It was our pleasure to meet you today. We will call you with the results of your scan within the next few days. Your scan will be assigned a Lung RADS category score by the physicians reading the scans.  This Lung RADS score determines follow up scanning.  See below for description of categories, and follow up screening recommendations. We will be in touch to schedule your follow up screening annually or based on recommendations of our providers. We will fax a copy of your scan results to your Primary Care Physician, or the physician who referred you to the program, to ensure they have the results. Please call the office if you have any questions or concerns regarding your scanning experience or results.  Our office number is [phone]. Please speak with Denise Phelps, RN. , or  Denise Buckner RN, They are  our Lung Cancer Screening RN.'s If They are unavailable when you call, Please leave a message on the voice mail. We will return your call at our earliest convenience.This voice mail is monitored several times a day.  Remember, if your scan is normal, we will scan you annually as long as you continue to meet the criteria for the program. (Age 55-77, Current smoker or smoker who has quit within the last 15 years). If you are a smoker, remember, quitting is the single most powerful action that you can take to decrease your risk of lung cancer and other pulmonary, breathing related problems. We know quitting is hard, and we are here to help.  Please let us know if there is anything we can do to help you meet your goal of quitting. If you are a former smoker, congratulations. We are proud of you! Remain smoke free! Remember you can refer friends or family members through the number above.  We will screen them to make sure they meet criteria for the program. Thank you for helping us take better care of you by  participating in Lung Screening.  You can receive free nicotine replacement therapy ( patches, gum or mints) by calling 1-800-QUIT NOW. Please call so we can get you on the path to becoming  a non-smoker. I know it is hard, but you can do this!  Lung RADS Categories:  Lung RADS 1: no nodules or definitely non-concerning nodules.  Recommendation is for a repeat annual scan in 12 months.  Lung RADS 2:  nodules that are non-concerning in appearance and behavior with a very low likelihood of becoming an active cancer. Recommendation is for a repeat annual scan in 12 months.  Lung RADS 3: nodules that are probably non-concerning , includes nodules with a low likelihood of becoming an active cancer.  Recommendation is for a 6-month repeat screening scan. Often noted after an upper respiratory illness. We will be in touch to make sure you have no questions, and to schedule your 6-month scan.  Lung RADS 4 A: nodules with concerning findings, recommendation is most often for a follow up scan in 3 months or additional testing based on our provider's assessment of the scan. We will be in touch to make sure you have no questions and to schedule the recommended 3 month follow up scan.  Lung RADS 4 B:  indicates findings that are concerning. We will be in touch with you to schedule additional diagnostic testing based on our provider's  assessment of the scan.  Other options for assistance in smoking cessation (   As covered by your insurance benefits)  Hypnosis for smoking cessation  [employer] [phone]  Acupuncture for smoking cessation  [facility] [phone]   

## 2021-11-23 NOTE — Progress Notes (Signed)
Virtual Visit via Telephone Note  I connected with Kristen Wong on 04/24/21 at  2:00 PM EST by telephone and verified that I am speaking with the correct person using two identifiers.  Location: Patient: Home Provider: Working from home   I discussed the limitations, risks, security and privacy concerns of performing an evaluation and management service by telephone and the availability of in person appointments. I also discussed with the patient that there may be a patient responsible charge related to this service. The patient expressed understanding and agreed to proceed.  Shared Decision Making Visit Lung Cancer Screening Program [phone])   Eligibility: Age 56 y.o. Pack Years Smoking History Calculation 60 (# packs/per year x # years smoked) Recent History of coughing up blood  no Unexplained weight loss? no ( >Than 15 pounds within the last 6 months ) Prior History Lung / other cancer no (Diagnosis within the last 5 years already requiring surveillance chest CT Scans). Smoking Status Current Smoker Former Smokers: Years since quit: NA  Quit Date: NA  Visit Components: Discussion included one or more decision making aids. yes Discussion included risk/benefits of screening. yes Discussion included potential follow up diagnostic testing for abnormal scans. yes Discussion included meaning and risk of over diagnosis. yes Discussion included meaning and risk of False Positives. yes Discussion included meaning of total radiation exposure. yes  Counseling Included: Importance of adherence to annual lung cancer LDCT screening. yes Impact of comorbidities on ability to participate in the program. yes Ability and willingness to under diagnostic treatment. yes  Smoking Cessation Counseling: Current Smokers:  Discussed importance of smoking cessation. yes Information about tobacco cessation classes and interventions provided to patient. yes Patient provided with "ticket" for LDCT  Scan. yes Symptomatic Patient. yes  Counseling(Intermediate counseling: > three minutes) [address]406 Diagnosis Code: Tobacco Use Z72.0 Asymptomatic Patient no  Counseling NA Former Smokers:  Discussed the importance of maintaining cigarette abstinence. yes Diagnosis Code: Personal History of Nicotine Dependence. U88.280 Information about tobacco cessation classes and interventions provided to patient. Yes Patient provided with "ticket" for LDCT Scan. yes Written Order for Lung Cancer Screening with LDCT placed in Epic. Yes (CT Chest Lung Cancer Screening Low Dose W/O CM) [id number] Z12.2-Screening of respiratory organs Z87.891-Personal history of nicotine dependence   I spent 25 minutes of face to face time with her discussing the risks and benefits of lung cancer screening. We viewed a power point together that explained in detail the above noted topics. We took the time to pause the power point at intervals to allow for questions to be asked and answered to ensure understanding. We discussed that she had taken the single most powerful action possible to decrease her risk of developing lung cancer when she quit smoking. I counseled her to remain smoke free, and to contact me if she ever had the desire to smoke again so that I can provide resources and tools to help support the effort to remain smoke free. We discussed the time and location of the scan, and that either  [name] or I will call with the results within  24-48 hours of receiving them. She has my card and contact information in the event she needs to speak with me, in addition to a copy of the power point we reviewed as a resource. She verbalized understanding of all of the above and had no further questions upon leaving the office.     I explained to the patient that there has been a  high incidence of coronary artery disease noted on these exams. I explained that this is a non-gated exam therefore degree or severity cannot be  determined. This patient is not on statin therapy. I have asked the patient to follow-up with their PCP regarding any incidental finding of coronary artery disease and management with diet or medication as they feel is clinically indicated. The patient verbalized understanding of the above and had no further questions.   I spent 3 minutes counseling on smoking cessation and the health risks of continued tobacco abuse    [name] D. Kenton Kingfisher, NP-C [facility] Personal contact information can be found on Amion  11/23/2021, 8:13 AM

## 2021-11-27 ENCOUNTER — Other Ambulatory Visit: Payer: Self-pay | Admitting: Acute Care

## 2021-11-27 DIAGNOSIS — Z122 Encounter for screening for malignant neoplasm of respiratory organs: Secondary | ICD-10-CM

## 2021-11-27 DIAGNOSIS — F17210 Nicotine dependence, cigarettes, uncomplicated: Secondary | ICD-10-CM

## 2021-11-27 DIAGNOSIS — Z87891 Personal history of nicotine dependence: Secondary | ICD-10-CM

## 2021-12-06 ENCOUNTER — Inpatient Hospital Stay: Payer: BC Managed Care – PPO

## 2021-12-06 ENCOUNTER — Encounter: Payer: Self-pay | Admitting: Oncology

## 2021-12-06 ENCOUNTER — Inpatient Hospital Stay: Payer: BC Managed Care – PPO | Attending: Oncology | Admitting: Oncology

## 2021-12-06 VITALS — BP 131/66 | HR 77 | Temp 97.1°F | Ht 64.0 in | Wt 222.0 lb

## 2021-12-06 DIAGNOSIS — Z79899 Other long term (current) drug therapy: Secondary | ICD-10-CM | POA: Diagnosis not present

## 2021-12-06 DIAGNOSIS — Z716 Tobacco abuse counseling: Secondary | ICD-10-CM

## 2021-12-06 DIAGNOSIS — D72820 Lymphocytosis (symptomatic): Secondary | ICD-10-CM | POA: Diagnosis not present

## 2021-12-06 DIAGNOSIS — D751 Secondary polycythemia: Secondary | ICD-10-CM

## 2021-12-06 DIAGNOSIS — F17210 Nicotine dependence, cigarettes, uncomplicated: Secondary | ICD-10-CM | POA: Insufficient documentation

## 2021-12-06 LAB — CBC WITH DIFFERENTIAL/PLATELET
Abs Immature Granulocytes: 0.04 10*3/uL (ref 0.00–0.07)
Basophils Absolute: 0.1 10*3/uL (ref 0.0–0.1)
Basophils Relative: 0 %
Eosinophils Absolute: 0.3 10*3/uL (ref 0.0–0.5)
Eosinophils Relative: 2 %
HCT: 48 % — ABNORMAL HIGH (ref 36.0–46.0)
Hemoglobin: 16.5 g/dL — ABNORMAL HIGH (ref 12.0–15.0)
Immature Granulocytes: 0 %
Lymphocytes Relative: 48 %
Lymphs Abs: 5.9 10*3/uL — ABNORMAL HIGH (ref 0.7–4.0)
MCH: 31.3 pg (ref 26.0–34.0)
MCHC: 34.4 g/dL (ref 30.0–36.0)
MCV: 90.9 fL (ref 80.0–100.0)
Monocytes Absolute: 0.8 10*3/uL (ref 0.1–1.0)
Monocytes Relative: 6 %
Neutro Abs: 5.5 10*3/uL (ref 1.7–7.7)
Neutrophils Relative %: 44 %
Platelets: 356 10*3/uL (ref 150–400)
RBC: 5.28 MIL/uL — ABNORMAL HIGH (ref 3.87–5.11)
RDW: 13.3 % (ref 11.5–15.5)
WBC: 12.5 10*3/uL — ABNORMAL HIGH (ref 4.0–10.5)
nRBC: 0 % (ref 0.0–0.2)

## 2021-12-07 LAB — CARBON MONOXIDE, BLOOD (PERFORMED AT REF LAB): Carbon Monoxide, Blood: 8 % — ABNORMAL HIGH (ref 0.0–3.6)

## 2021-12-07 LAB — ERYTHROPOIETIN: Erythropoietin: 11.5 m[IU]/mL (ref 2.6–18.5)

## 2021-12-07 NOTE — Progress Notes (Signed)
[name] follow up visit  Patient Care Team: Marinda Elk, MD as PCP - General (Physician Assistant)  Reason of visit  Follow-up for erythrocytosis   HISTORY OF PRESENTING ILLNESS: Kristen Wong 56 y.o. 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  Saw pulmonology Dr.Fleming recently for dyspnea, COPD, on albuterol inhaler.  Lung nodules, <30m. Dr.Flemming recommend repeat CT in 4- 6 months.   Smoking cessation is discussed and she is not interested.  JAK2 with reflex to CARL/MPL is negative. Continue Aspirin 833mdaily.  Phlebotomy threshold is hematocrit 50 and above   INTERVAL HISTORY [age].o. female presents for follow up of elevated blood count.  Patient was last seen 2 years ago and lost follow-up.  Was referred back to reestablish care for She is currently everyday smoker,  2 packs/day Denies weight loss, fever, chills, fatigue, night sweats.     Review of Systems  Constitutional:  Positive for fatigue. Negative for appetite change, chills, fever and unexpected weight change.  HENT:   Negative for hearing loss and voice change.   Eyes:  Negative for eye problems.  Respiratory:  Negative for chest tightness and cough.   Cardiovascular:  Negative for chest pain.  Gastrointestinal:  Negative for abdominal distention, abdominal pain and blood in stool.  Endocrine: Negative for hot flashes.  Genitourinary:  Negative for difficulty urinating and frequency.   Musculoskeletal:  Negative for arthralgias.  Skin:  Negative for itching and rash.  Neurological:  Negative for extremity weakness.  Hematological:  Negative for adenopathy.  Psychiatric/Behavioral:  Negative for confusion.     MEDICAL HISTORY: Past Medical  History:  Diagnosis Date   Arthritis    KNEES   Blood dyscrasia    POLYCYTHEMIA   COPD (chronic obstructive pulmonary disease) (HCC)    Depression    H/O   Diabetes mellitus without complication ([id number] Type 2-NEWLY DX   Fatty liver    GERD (gastroesophageal reflux disease)    OCC   Heart murmur    ASYMPTOMATIC   Hypertension    Polycythemia     SURGICAL HISTORY: Past Surgical History:  Procedure Laterality Date   ABDOMINAL HYSTERECTOMY     CESAREAN SECTION     COLONOSCOPY WITH PROPOFOL N/A 08/08/2017   Procedure: COLONOSCOPY WITH PROPOFOL;  Surgeon: ElManya SilvasMD;  [facility];  Service: Endoscopy;  Laterality: N/A;   COLONOSCOPY WITH PROPOFOL N/A 01/12/2021   Procedure: COLONOSCOPY WITH PROPOFOL;  Surgeon: LoLesly RubensteinMD;  Location: [facility] ENDOSCOPY;  Service: Endoscopy;  Laterality: N/A;  DM   COLONOSCOPY WITH PROPOFOL N/A 05/11/2021   Procedure: COLONOSCOPY WITH PROPOFOL;  Surgeon: LoLesly RubensteinMD;  Location: [facility] ENDOSCOPY;  Service: Endoscopy;  Laterality: N/A;  DM   DIAGNOSTIC LAPAROSCOPY     ESOPHAGOGASTRODUODENOSCOPY (EGD) WITH PROPOFOL N/A 08/08/2017   Procedure: ESOPHAGOGASTRODUODENOSCOPY (EGD) WITH PROPOFOL;  Surgeon: ElManya SilvasMD;  [facility];  Service: Endoscopy;  Laterality: N/A;   KNEE SURGERY     [facility]   LAPAROSCOPIC HYSTERECTOMY Bilateral 01/22/2017   Procedure: HYSTERECTOMY TOTAL LAPAROSCOPIC BSO;  Surgeon: StMalachy MoodMD;  Location: [facility] ORS;  Service: Gynecology;  Laterality: Bilateral;    SOCIAL HISTORY: Social History   Socioeconomic History   Marital status: Divorced  Spouse name: Not on file   Number of children: Not on file   Years of education: Not on file   Highest education level: Not on file  Occupational History   Not on file  Tobacco Use   Smoking status: Every Day    Packs/day: 2.00    Years: 30.00    Total pack years: 60.00    Types: Cigarettes   Smokeless  tobacco: Never  Vaping Use   Vaping Use: Never used  Substance and Sexual Activity   Alcohol use: No   Drug use: No   Sexual activity: Not Currently  Other Topics Concern   Not on file  Social History Narrative   Not on file   Social Determinants of Health   Financial Resource Strain: Not on file  Food Insecurity: Not on file  Transportation Needs: Not on file  Physical Activity: Not on file  Stress: Not on file  Social Connections: Not on file  Intimate Partner Violence: Not on file    FAMILY HISTORY Family History  Problem Relation Age of Onset   Diabetes Mother    Diabetes Father    Diabetes Sister    Breast cancer Neg Hx     ALLERGIES:  is allergic to penicillin g.  MEDICATIONS:  Current Outpatient Medications  Medication Sig Dispense Refill   Albuterol Sulfate 108 (90 Base) MCG/ACT AEPB Inhale 2 Inhalers into the lungs every 6 (six) hours as needed.     benzonatate (TESSALON) 200 MG capsule TAKE 1 CAPSULE BY MOUTH THREE TIMES DAILY AS NEEDED FOR COUGH FOR UP TO 7 DAYS     cetirizine (ZYRTEC) 10 MG tablet Take 20 mg by mouth daily.     dicyclomine (BENTYL) 10 MG capsule Take 10-20 mg by mouth every 6 (six) hours as needed.     etodolac (LODINE) 400 MG tablet Take 400 mg by mouth 2 (two) times daily.     fluticasone (FLONASE) 50 MCG/ACT nasal spray Place 2 sprays into both nostrils daily as needed for allergies or rhinitis.     HYDROcodone bit-homatropine (HYCODAN) 5-1.5 MG/5ML syrup Take 5 mLs by mouth every 6 (six) hours as needed for cough.     ibuprofen (ADVIL,MOTRIN) 600 MG tablet Take 1 tablet (600 mg total) by mouth every 6 (six) hours as needed for headache, moderate pain or cramping. 60 tablet 1   metFORMIN (GLUCOPHAGE) 500 MG tablet Take 1 tablet (500 mg total) by mouth 2 (two) times daily with a meal. 60 tablet 0   metoprolol tartrate (LOPRESSOR) 25 MG tablet Take 25 mg by mouth 2 (two) times daily.     Multiple Vitamin (MULTIVITAMIN) capsule Take 1  capsule by mouth daily.     nystatin (MYCOSTATIN/NYSTOP) powder Apply topically 2 (two) times daily. Until rash resolved. 15 g 0   omeprazole (PRILOSEC) 40 MG capsule TAKE 1 CAPSULE BY MOUTH ONCE DAILY 30 MINUTES BEFORE BREAKFAST     oxyCODONE-acetaminophen (PERCOCET/ROXICET) 5-325 MG tablet Take 1-2 tablets by mouth every 4 (four) hours as needed (moderate to severe pain (when tolerating fluids)). 40 tablet 0   PARoxetine (PAXIL) 10 MG tablet TAKE 1 & 1 2 (ONE & ONE HALF) TABLETS BY MOUTH ONCE DAILY     predniSONE (DELTASONE) 20 MG tablet TAKE 1 TABLET BY MOUTH ONCE DAILY FOR 5 DAYS     terconazole (TERAZOL 3) 0.8 % vaginal cream Place 1 applicator vaginally at bedtime. 20 g 1   traZODone (DESYREL) 50 MG tablet Take 50 mg  by mouth at bedtime.     triamcinolone cream (KENALOG) 0.1 % APPLY CREAM EXTERNALLY TWICE DAILY     ferrous sulfate 325 (65 FE) MG tablet Take 325 mg by mouth daily. (Patient not taking: Reported on 01/12/2021)     hyoscyamine (LEVBID) 0.375 MG 12 hr tablet SMARTSIG:1 Tablet(s) By Mouth Every 12 Hours PRN (Patient not taking: Reported on 01/12/2021)     No current facility-administered medications for this visit.    PHYSICAL EXAMINATION:  ECOG PERFORMANCE STATUS: 1 - Symptomatic but completely ambulatory  Vitals:   12/06/21 1110  BP: 131/66  Pulse: 77  Temp: (!) 97.1 F (36.2 C)    Filed Weights   12/06/21 1110  Weight: 222 lb (100.7 kg)    Physical Exam Constitutional:      General: She is not in acute distress.    Appearance: She is not diaphoretic.     Comments: Obese.   HENT:     Head: Normocephalic and atraumatic.     Nose: Nose normal.     Mouth/Throat:     Pharynx: No oropharyngeal exudate.  Eyes:     General: No scleral icterus.    Pupils: Pupils are equal, round, and reactive to light.  Cardiovascular:     Rate and Rhythm: Normal rate and regular rhythm.     Heart sounds: No murmur heard. Pulmonary:     Effort: Pulmonary effort is normal. No  respiratory distress.     Breath sounds: No rales.  Chest:     Chest wall: No tenderness.  Abdominal:     General: There is no distension.     Palpations: Abdomen is soft.     Tenderness: There is no abdominal tenderness.  Musculoskeletal:        General: Normal range of motion.     Cervical back: Normal range of motion and neck supple.  Skin:    General: Skin is warm and dry.     Findings: No erythema.  Neurological:     Mental Status: She is alert and oriented to person, place, and time.     Cranial Nerves: No cranial nerve deficit.     Motor: No abnormal muscle tone.     Coordination: Coordination normal.  Psychiatric:        Mood and Affect: Affect normal.     LABORATORY DATA: I have personally reviewed the data as listed:    Latest Ref Rng & Units 12/06/2021   12:14 PM 11/02/2019   12:46 PM 07/19/2019   12:58 PM  CBC  WBC 4.0 - 10.5 K/uL 12.5  13.3  11.4   Hemoglobin 12.0 - 15.0 g/dL 16.5  15.7  16.2   Hematocrit 36.0 - 46.0 % 48.0  44.9  49.4   Platelets 150 - 400 K/uL 356  360  360       Latest Ref Rng & Units 10/31/2021   10:19 AM 07/19/2019   12:58 PM 01/08/2019    1:32 PM  CMP  Glucose 70 - 99 mg/dL  193  113   BUN 6 - 20 mg/dL  13  12   Creatinine 0.44 - 1.00 mg/dL 0.50  0.44  0.59   Sodium 135 - 145 mmol/L  136  140   Potassium 3.5 - 5.1 mmol/L  3.5  3.6   Chloride 98 - 111 mmol/L  103  104   CO2 22 - 32 mmol/L  22  26   Calcium 8.9 - 10.3 mg/dL  8.9  8.7   Total Protein 6.5 - 8.1 g/dL  7.3  7.1   Total Bilirubin 0.3 - 1.2 mg/dL  0.4  0.5   Alkaline Phos 38 - 126 U/L  95  96   AST 15 - 41 U/L  53  41   ALT 0 - 44 U/L  66  46    RADIOGRAPHIC STUDIES: I have personally reviewed the radiological images as listed and agreed with the findings in the report. CT CHEST LUNG CA SCREEN LOW DOSE W/O CM  Result Date: 11/26/2021 CLINICAL DATA:  56 year old asymptomatic female current smoker with 60 pack-year smoking history. EXAM: CT CHEST WITHOUT CONTRAST LOW-DOSE  FOR LUNG CANCER SCREENING TECHNIQUE: Multidetector CT imaging of the chest was performed following the standard protocol without IV contrast. RADIATION DOSE REDUCTION: This exam was performed according to the departmental dose-optimization program which includes automated exposure control, adjustment of the mA and/or kV according to patient size and/or use of iterative reconstruction technique. COMPARISON:  12/04/2026 chest CT. FINDINGS: Cardiovascular: Normal heart size. No significant pericardial effusion/thickening. Atherosclerotic nonaneurysmal thoracic aorta. Normal caliber pulmonary arteries. Mediastinum/Nodes: Stable goiter asymmetrically enlarged on the right without discrete thyroid nodules by CT. Unremarkable esophagus. No pathologically enlarged axillary, mediastinal or hilar lymph nodes, noting limited sensitivity for the detection of hilar adenopathy on this noncontrast study. Lungs/Pleura: No pneumothorax. No pleural effusion. Mild centrilobular emphysema with diffuse bronchial wall thickening. No acute consolidative airspace disease or lung masses. Numerous scattered solid bilateral pulmonary nodules, largest 5.4 mm in volume derived mean diameter in the posterior left lower lobe (series 3/image 131), not appreciably changed from 12/04/2018 CT. Upper abdomen: No acute abnormality. Musculoskeletal: No aggressive appearing focal osseous lesions. Mild thoracic spondylosis. IMPRESSION: 1. Lung-RADS 2, benign appearance or behavior. Continue annual screening with low-dose chest CT without contrast in 12 months. 2. Stable goiter. 3. Aortic Atherosclerosis (ICD10-I70.0) and Emphysema (ICD10-J43.9). Electronically Signed   By: Ilona Sorrel M.D.   On: 11/26/2021 09:27  CT ABDOMEN PELVIS W CONTRAST  Result Date: 11/01/2021 CLINICAL DATA:  Chronic diarrhea and lower abdominal pain. EXAM: CT ABDOMEN AND PELVIS WITH CONTRAST TECHNIQUE: Multidetector CT imaging of the abdomen and pelvis was performed using the  standard protocol following bolus administration of intravenous contrast. RADIATION DOSE REDUCTION: This exam was performed according to the departmental dose-optimization program which includes automated exposure control, adjustment of the mA and/or kV according to patient size and/or use of iterative reconstruction technique. CONTRAST:  147m OMNIPAQUE IOHEXOL 300 MG/ML  SOLN COMPARISON:  Chest CT 12/04/2018 FINDINGS: Lower chest: Normal heart size. Multiple 2-4 mm lower lobe pulmonary nodules are redemonstrated and unchanged when compared to prior chest CT December 04, 2018 compatible with benign process. Hepatobiliary: The liver is normal in size and contour. No focal lesions identified. Cholelithiasis. No intrahepatic or extrahepatic biliary ductal dilatation. Pancreas: Unremarkable Spleen: Unremarkable Adrenals/Urinary Tract: Adrenal glands are normal. Kidneys enhance symmetrically with contrast. No hydronephrosis. Urinary bladder is unremarkable. Multiple bilateral renal stones are demonstrated. No ureterolithiasis. Stomach/Bowel: Oral contrast material throughout the small and large bowel. Normal morphology of the stomach. No evidence for small bowel obstruction. No free fluid or free intraperitoneal air. Mild wall thickening of the distal transverse colon. Vascular/Lymphatic: Normal caliber abdominal aorta. Peripheral calcified atherosclerotic plaque. No retroperitoneal lymphadenopathy. Reproductive: Prior hysterectomy.  No pelvic masses identified. Other: None. Musculoskeletal: No aggressive or acute appearing osseous lesions. IMPRESSION: 1. Mild wall thickening of the distal transverse colon raising possibility of colitis in the appropriate clinical setting. Correlation  with the patient's colon cancer screening history is recommended. If screening is not up-to-date, appropriate screening should be considered. 2. Bilateral nonobstructing nephrolithiasis. Electronically Signed   By: Lovey Newcomer M.D.   On:  11/01/2021 21:00      ASSESSMENT/PLAN 1. Secondary erythrocytosis   2. Tobacco abuse counseling   3. Lymphocytosis    #Secondary erythrocytosis, due to smoking. Previous work-up is consistent with secondary erythrocytosis. I will check JAK2 exon 12-15, BCR ABL 1 FISH. No need for phlebotomy at this point.  Tobacco use, smoke cessation was discussed with patient. Patient gets annual lung cancer screening CT.  Chronic leukocytosis, likely reactive due to smoking. Check peripheral blood flowcytometry  Follow up in 6 months.     Earlie Server, MD  12/07/2021 10:25 PM

## 2021-12-10 LAB — BCR-ABL1 FISH
Cells Analyzed: 200
Cells Counted: 200

## 2022-01-25 ENCOUNTER — Encounter: Payer: Self-pay | Admitting: Dietician

## 2022-01-25 NOTE — Progress Notes (Signed)
Have not heard back from patient to schedule her initial MNT visit. Sent notification to referring provider.

## 2022-02-08 ENCOUNTER — Ambulatory Visit
Admission: RE | Admit: 2022-02-08 | Discharge: 2022-02-08 | Disposition: A | Discharge status: Home / Self Care | Payer: BC Managed Care – PPO | Attending: Gastroenterology | Admitting: Gastroenterology

## 2022-02-08 ENCOUNTER — Ambulatory Visit: Payer: BC Managed Care – PPO | Admitting: Anesthesiology

## 2022-02-08 ENCOUNTER — Encounter
Admission: RE | Disposition: A | Discharge status: Home / Self Care | Payer: Self-pay | Source: Home / Self Care | Attending: Gastroenterology

## 2022-02-08 ENCOUNTER — Encounter: Payer: Self-pay | Admitting: *Deleted

## 2022-02-08 DIAGNOSIS — Z7984 Long term (current) use of oral hypoglycemic drugs: Secondary | ICD-10-CM | POA: Diagnosis not present

## 2022-02-08 DIAGNOSIS — K640 First degree hemorrhoids: Secondary | ICD-10-CM | POA: Diagnosis not present

## 2022-02-08 DIAGNOSIS — K219 Gastro-esophageal reflux disease without esophagitis: Secondary | ICD-10-CM | POA: Insufficient documentation

## 2022-02-08 DIAGNOSIS — E669 Obesity, unspecified: Secondary | ICD-10-CM | POA: Diagnosis not present

## 2022-02-08 DIAGNOSIS — I10 Essential (primary) hypertension: Secondary | ICD-10-CM | POA: Diagnosis not present

## 2022-02-08 DIAGNOSIS — E119 Type 2 diabetes mellitus without complications: Secondary | ICD-10-CM | POA: Insufficient documentation

## 2022-02-08 DIAGNOSIS — F17200 Nicotine dependence, unspecified, uncomplicated: Secondary | ICD-10-CM | POA: Insufficient documentation

## 2022-02-08 DIAGNOSIS — Z6838 Body mass index (BMI) 38.0-38.9, adult: Secondary | ICD-10-CM | POA: Diagnosis not present

## 2022-02-08 DIAGNOSIS — F32A Depression, unspecified: Secondary | ICD-10-CM | POA: Insufficient documentation

## 2022-02-08 DIAGNOSIS — K7581 Nonalcoholic steatohepatitis (NASH): Secondary | ICD-10-CM | POA: Diagnosis not present

## 2022-02-08 DIAGNOSIS — J449 Chronic obstructive pulmonary disease, unspecified: Secondary | ICD-10-CM | POA: Diagnosis not present

## 2022-02-08 DIAGNOSIS — M1990 Unspecified osteoarthritis, unspecified site: Secondary | ICD-10-CM | POA: Insufficient documentation

## 2022-02-08 DIAGNOSIS — Z79899 Other long term (current) drug therapy: Secondary | ICD-10-CM | POA: Diagnosis not present

## 2022-02-08 DIAGNOSIS — R933 Abnormal findings on diagnostic imaging of other parts of digestive tract: Secondary | ICD-10-CM | POA: Diagnosis not present

## 2022-02-08 HISTORY — DX: Irritable bowel syndrome, unspecified: K58.9

## 2022-02-08 HISTORY — PX: COLONOSCOPY WITH PROPOFOL: SHX5780

## 2022-02-08 LAB — GLUCOSE, CAPILLARY: Glucose-Capillary: 162 mg/dL — ABNORMAL HIGH (ref 70–99)

## 2022-02-08 SURGERY — COLONOSCOPY WITH PROPOFOL
Anesthesia: General

## 2022-02-08 MED ORDER — PROPOFOL 10 MG/ML IV BOLUS
INTRAVENOUS | Status: DC | PRN
  Administered 2022-02-08: 60 mg via INTRAVENOUS

## 2022-02-08 MED ORDER — PROPOFOL 500 MG/50ML IV EMUL
INTRAVENOUS | Status: DC | PRN
  Administered 2022-02-08: 200 ug/kg/min via INTRAVENOUS

## 2022-02-08 MED ORDER — SODIUM CHLORIDE 0.9 % IV SOLN: INTRAVENOUS | Status: DC

## 2022-02-08 NOTE — Transfer of Care (Signed)
Immediate Anesthesia Transfer of Care Note  Patient: Kristen Wong  Procedure(s) Performed: COLONOSCOPY WITH PROPOFOL  Patient Location: PACU  Anesthesia Type:General  Level of Consciousness: oriented  Airway & Oxygen Therapy: Patient Spontanous Breathing and Patient connected to nasal cannula oxygen  Post-op Assessment: Report given to RN and Post -op Vital signs reviewed and stable  Post vital signs: Reviewed and stable  Last Vitals:  Vitals Value Taken Time  BP 141/75 02/08/22 0937  Temp    Pulse 75 02/08/22 0937  Resp 24 02/08/22 0937  SpO2 95 % 02/08/22 0937    Last Pain:  Vitals:   02/08/22 0937  PainSc: Asleep         Complications: No notable events documented.

## 2022-02-08 NOTE — Anesthesia Procedure Notes (Signed)
Date/Time: 02/08/2022 9:11 AM  Performed by: Nelda Marseille, CRNAPre-anesthesia Checklist: Patient identified, Emergency Drugs available, Suction available, Patient being monitored and Timeout performed Oxygen Delivery Method: Nasal cannula

## 2022-02-08 NOTE — Anesthesia Preprocedure Evaluation (Signed)
Anesthesia Evaluation  Patient identified by MRN, date of birth, ID band Patient awake    Reviewed: Allergy & Precautions, NPO status , Patient's Chart, lab work & pertinent test results  Airway Mallampati: III  TM Distance: >3 FB Neck ROM: full    Dental  (+) Upper Dentures, Poor Dentition   Pulmonary neg pulmonary ROS, shortness of breath and with exertion, COPD,  COPD inhaler, Current Smoker,    Pulmonary exam normal  + decreased breath sounds      Cardiovascular Exercise Tolerance: Good hypertension, Pt. on medications negative cardio ROS Normal cardiovascular exam Rhythm:Regular     Neuro/Psych Depression negative neurological ROS  negative psych ROS   GI/Hepatic negative GI ROS, Neg liver ROS, GERD  Medicated,  Endo/Other  negative endocrine ROSdiabetes, Well Controlled, Type 2, Oral Hypoglycemic Agents  Renal/GU negative Renal ROS  negative genitourinary   Musculoskeletal  (+) Arthritis ,   Abdominal (+) + obese,   Peds negative pediatric ROS (+)  Hematology negative hematology ROS (+) Blood dyscrasia, ,   Anesthesia Other Findings Past Medical History: No date: Arthritis     Comment:  KNEES No date: Blood dyscrasia     Comment:  POLYCYTHEMIA No date: COPD (chronic obstructive pulmonary disease) (HCC) No date: Depression     Comment:  H/O 2018: Diabetes mellitus without complication (HCC)     Comment:  Type 2-NEWLY DX No date: Fatty liver No date: GERD (gastroesophageal reflux disease)     Comment:  OCC No date: Heart murmur     Comment:  ASYMPTOMATIC No date: Hypertension No date: Irritable bowel syndrome No date: Polycythemia  Past Surgical History: No date: ABDOMINAL HYSTERECTOMY No date: CESAREAN SECTION 08/08/2017: COLONOSCOPY WITH PROPOFOL; N/A     Comment:  Procedure: COLONOSCOPY WITH PROPOFOL;  Surgeon: Manya Silvas, MD;  Location: [facility];  Service:                Endoscopy;  Laterality: N/A; 01/12/2021: COLONOSCOPY WITH PROPOFOL; N/A     Comment:  Procedure: COLONOSCOPY WITH PROPOFOL;  Surgeon:               [name], MD;  Location: [facility] END[facility]COPY;                Service: Endoscopy;  Laterality: N/A;  DM 05/11/2021: COLONOSCOPY WITH PROPOFOL; N/A     Comment:  Procedure: COLONOSCOPY WITH PROPOFOL;  Surgeon:               [name], MD;  Location: [facility] ENDOSCOPY;                Service: Endoscopy;  Laterality: N/A;  DM No date: DIAGNOSTIC LAPAROSCOPY 08/08/2017: ESOPHAGOGASTRODUODENOSCOPY (EGD) WITH PROPOFOL; N/A     Comment:  Procedure: ESOPHAGOGASTRODUODENOSCOPY (EGD) WITH               PROPOFOL;  Surgeon: Manya Silvas, MD;  Location:               [facility] ENDOSCOPY;  Service: Endoscopy;  Laterality: N/A; No date: KNEE SURGERY     Comment:  [facility] 01/22/2017: LAPAROSCOPIC HYSTERECTOMY; Bilateral     Comment:  Procedure: HYSTERECTOMY TOTAL LAPAROSCOPIC BSO;                Surgeon: Malachy Mood, MD;  Location: [facility] ORS;  Service: Gynecology;  Laterality: Bilateral;  BMI    Body Mass Index: 38.79 kg/m      Reproductive/Obstetrics negative OB ROS                             Anesthesia Physical Anesthesia Plan  ASA: 3  Anesthesia Plan: General   Post-op Pain Management:    Induction: Intravenous  PONV Risk Score and Plan: Propofol infusion and TIVA  Airway Management Planned: Natural Airway  Additional Equipment:   Intra-op Plan:   Post-operative Plan:   Informed Consent: I have reviewed the patients History and Physical, chart, labs and discussed the procedure including the risks, benefits and alternatives for the proposed anesthesia with the patient or authorized representative who has indicated his/her understanding and acceptance.     Dental Advisory Given  Plan Discussed with: CRNA and Surgeon  Anesthesia Plan Comments:         Anesthesia Quick  Evaluation

## 2022-02-08 NOTE — Anesthesia Postprocedure Evaluation (Signed)
Anesthesia Post Note  Patient: Kristen Wong  Procedure(s) Performed: COLONOSCOPY WITH PROPOFOL  Patient location during evaluation: PACU Anesthesia Type: General Level of consciousness: awake and awake and alert Pain management: pain level controlled Vital Signs Assessment: post-procedure vital signs reviewed and stable Respiratory status: spontaneous breathing and respiratory function stable Cardiovascular status: stable Anesthetic complications: no   No notable events documented.   Last Vitals:  Vitals:   02/08/22 0949 02/08/22 1000  BP: 136/70 139/65  Pulse: 72 67  Resp: 20 14  SpO2: 97% 97%    Last Pain:  Vitals:   02/08/22 1000  PainSc: 0-No pain                 VAN STAVEREN,Phelan Schadt

## 2022-02-08 NOTE — Interval H&P Note (Signed)
History and Physical Interval Note:  02/08/2022 9:09 AM  Kristen Wong  has presented today for surgery, with the diagnosis of ABDOMINAL PAIN,ibs.  The various methods of treatment have been discussed with the patient and family. After consideration of risks, benefits and other options for treatment, the patient has consented to  Procedure(s): COLONOSCOPY WITH PROPOFOL (N/A) as a surgical intervention.  The patient's history has been reviewed, patient examined, no change in status, stable for surgery.  I have reviewed the patient's chart and labs.  Questions were answered to the patient's satisfaction.     [name]  Ok to proceed with colonoscopy

## 2022-02-08 NOTE — Op Note (Addendum)
[facility] Gastroenterology Patient Name: Kristen Wong Procedure Date: 02/08/2022 9:01 AM MRN: [id number] Account #: [id number] Date of Birth: 17-Jul-1965 Admit Type: Outpatient Age: 56 Room: [facility] ENDO ROOM 3 Gender: Female Note Status: Supervisor Override Instrument Name: Jasper Riling [id number] Procedure:             Colonoscopy Indications:           Abnormal CT of the GI tract, Generalized abdominal pain Providers:             Andrey Farmer MD, MD Referring MD:          Precious Bard, MD (Referring MD) Medicines:             Monitored Anesthesia Care Complications:         No immediate complications. Procedure:             Pre-Anesthesia Assessment:                        - Prior to the procedure, a History and Physical was                         performed, and patient medications and allergies were                         reviewed. The patient is competent. The risks and                         benefits of the procedure and the sedation options and                         risks were discussed with the patient. All questions                         were answered and informed consent was obtained.                         Patient identification and proposed procedure were                         verified by the physician, the nurse, the                         anesthesiologist, the anesthetist and the technician                         in the endoscopy suite. Mental Status Examination:                         alert and oriented. Airway Examination: normal                         oropharyngeal airway and neck mobility. Respiratory                         Examination: clear to auscultation. CV Examination:                         normal. Prophylactic Antibiotics: The patient does not  require prophylactic antibiotics. Prior                         Anticoagulants: The patient has taken no previous                         anticoagulant or  antiplatelet agents. ASA Grade                         Assessment: III - A patient with severe systemic                         disease. After reviewing the risks and benefits, the                         patient was deemed in satisfactory condition to                         undergo the procedure. The anesthesia plan was to use                         monitored anesthesia care (MAC). Immediately prior to                         administration of medications, the patient was                         re-assessed for adequacy to receive sedatives. The                         heart rate, respiratory rate, oxygen saturations,                         blood pressure, adequacy of pulmonary ventilation, and                         response to care were monitored throughout the                         procedure. The physical status of the patient was                         re-assessed after the procedure.                        After obtaining informed consent, the colonoscope was                         passed under direct vision. Throughout the procedure,                         the patient's blood pressure, pulse, and oxygen                         saturations were monitored continuously. The                         Colonoscope was introduced through the anus and  advanced to the the cecum, identified by appendiceal                         orifice and ileocecal valve. The colonoscopy was                         performed without difficulty. The patient tolerated                         the procedure well. The quality of the bowel                         preparation was inadequate. Findings:      The perianal and digital rectal examinations were normal.      Internal hemorrhoids were found during retroflexion. The hemorrhoids       were Grade I (internal hemorrhoids that do not prolapse).      The exam was otherwise without abnormality on direct and retroflexion        views. Impression:            - Preparation of the colon was inadequate.                        - Internal hemorrhoids.                        - The examination was otherwise normal on direct and                         retroflexion views.                        - No specimens collected. Recommendation:        - Discharge patient to home.                        - Resume previous diet.                        - Continue present medications.                        - Repeat colonoscopy because the bowel preparation was                         suboptimal. 1-2 years.                        - Return to referring physician as previously                         scheduled. Procedure Code(s):     --- Professional ---                        [phone], Colonoscopy, flexible; diagnostic, including                         collection of specimen(s) by brushing or washing, when                         performed (separate procedure) Diagnosis  Code(s):     --- Professional ---                        K64.0, First degree hemorrhoids                        R93.3, Abnormal findings on diagnostic imaging of                         other parts of digestive tract CPT copyright 2019 [employer]. All rights reserved. The codes documented in this report are preliminary and upon coder review may  be revised to meet current compliance requirements. Andrey Farmer MD, MD 02/08/2022 9:38:49 AM Number of Addenda: 0 Note Initiated On: 02/08/2022 9:01 AM Scope Withdrawal Time: 0 hours 10 minutes 29 seconds  Total Procedure Duration: 0 hours 13 minutes 48 seconds  Estimated Blood Loss:  Estimated blood loss: none.      [facility]

## 2022-02-08 NOTE — H&P (Signed)
Outpatient short stay form Pre-procedure 02/08/2022  [name], MD  Primary Physician: Marinda Elk, MD  Reason for visit:  Abnormal imaging  History of present illness:    56 y/o lady with obesity, tobacco abuse, and NASH with advanced fibrosis here for colonoscopy due to abnormal imaging. Had colonoscopy last December but prep was only fair. No blood thinners. No known family history of GI malignancies. History of hysterectomy.    Current Facility-Administered Medications:    0.9 %  sodium chloride infusion, , Intravenous, Continuous, Melisia Leming, Hilton Cork, MD  Medications Prior to Admission  Medication Sig Dispense Refill Last Dose   Albuterol Sulfate 108 (90 Base) MCG/ACT AEPB Inhale 2 Inhalers into the lungs every 6 (six) hours as needed.   Past Week   cetirizine (ZYRTEC) 10 MG tablet Take 20 mg by mouth daily.   02/07/2022   dicyclomine (BENTYL) 10 MG capsule Take 10-20 mg by mouth every 6 (six) hours as needed.   Past Week   etodolac (LODINE) 400 MG tablet Take 400 mg by mouth 2 (two) times daily.   Past Week   metFORMIN (GLUCOPHAGE) 500 MG tablet Take 1 tablet (500 mg total) by mouth 2 (two) times daily with a meal. 60 tablet 0 02/07/2022   metoprolol tartrate (LOPRESSOR) 25 MG tablet Take 25 mg by mouth 2 (two) times daily.   02/07/2022   benzonatate (TESSALON) 200 MG capsule TAKE 1 CAPSULE BY MOUTH THREE TIMES DAILY AS NEEDED FOR COUGH FOR UP TO 7 DAYS      ferrous sulfate 325 (65 FE) MG tablet Take 325 mg by mouth daily. (Patient not taking: Reported on 01/12/2021)   Not Taking   fluticasone (FLONASE) 50 MCG/ACT nasal spray Place 2 sprays into both nostrils daily as needed for allergies or rhinitis.      HYDROcodone bit-homatropine (HYCODAN) 5-1.5 MG/5ML syrup Take 5 mLs by mouth every 6 (six) hours as needed for cough. (Patient not taking: Reported on 02/08/2022)   Completed Course   hyoscyamine (LEVBID) 0.375 MG 12 hr tablet SMARTSIG:1 Tablet(s) By Mouth Every 12 Hours  PRN (Patient not taking: Reported on 01/12/2021)      ibuprofen (ADVIL,MOTRIN) 600 MG tablet Take 1 tablet (600 mg total) by mouth every 6 (six) hours as needed for headache, moderate pain or cramping. 60 tablet 1    Multiple Vitamin (MULTIVITAMIN) capsule Take 1 capsule by mouth daily.      nystatin (MYCOSTATIN/NYSTOP) powder Apply topically 2 (two) times daily. Until rash resolved. 15 g 0    omeprazole (PRILOSEC) 40 MG capsule TAKE 1 CAPSULE BY MOUTH ONCE DAILY 30 MINUTES BEFORE BREAKFAST      oxyCODONE-acetaminophen (PERCOCET/ROXICET) 5-325 MG tablet Take 1-2 tablets by mouth every 4 (four) hours as needed (moderate to severe pain (when tolerating fluids)). 40 tablet 0    PARoxetine (PAXIL) 10 MG tablet TAKE 1 & 1 2 (ONE & ONE HALF) TABLETS BY MOUTH ONCE DAILY      predniSONE (DELTASONE) 20 MG tablet TAKE 1 TABLET BY MOUTH ONCE DAILY FOR 5 DAYS      terconazole (TERAZOL 3) 0.8 % vaginal cream Place 1 applicator vaginally at bedtime. 20 g 1    traZODone (DESYREL) 50 MG tablet Take 50 mg by mouth at bedtime.      triamcinolone cream (KENALOG) 0.1 % APPLY CREAM EXTERNALLY TWICE DAILY        Allergies  Allergen Reactions   Penicillin G Nausea Only    Has patient had  a PCN reaction causing immediate rash, facial/tongue/throat swelling, SOB or lightheadedness with hypotension: No Has patient had a PCN reaction causing severe rash involving mucus membranes or skin necrosis: No Has patient had a PCN reaction that required hospitalization: No Has patient had a PCN reaction occurring within the last 10 years: No If all of the above answers are "NO", then may proceed with Cephalosporin use.      Past Medical History:  Diagnosis Date   Arthritis    KNEES   Blood dyscrasia    POLYCYTHEMIA   COPD (chronic obstructive pulmonary disease) (HCC)    Depression    H/O   Diabetes mellitus without complication ([place]) 9672   Type 2-NEWLY DX   Fatty liver    GERD (gastroesophageal reflux disease)     OCC   Heart murmur    ASYMPTOMATIC   Hypertension    Irritable bowel syndrome    Polycythemia     Review of systems:  Otherwise negative.    Physical Exam  Gen: Alert, oriented. Appears stated age.  HEENT: PERRLA. Lungs: No respiratory distress CV: RRR Abd: soft, benign, no masses Ext: No edema    Planned procedures: Proceed with colonoscopy. The patient understands the nature of the planned procedure, indications, risks, alternatives and potential complications including but not limited to bleeding, infection, perforation, damage to internal organs and possible oversedation/side effects from anesthesia. The patient agrees and gives consent to proceed.  Please refer to procedure notes for findings, recommendations and patient disposition/instructions.     [name], MD [facility]

## 2022-02-12 ENCOUNTER — Encounter: Payer: Self-pay | Admitting: Gastroenterology

## 2022-05-16 ENCOUNTER — Encounter: Payer: Self-pay | Admitting: Urology

## 2022-05-16 ENCOUNTER — Ambulatory Visit (INDEPENDENT_AMBULATORY_CARE_PROVIDER_SITE_OTHER): Payer: BC Managed Care – PPO | Admitting: Urology

## 2022-05-16 VITALS — BP 148/80 | HR 102 | Ht 64.0 in | Wt 226.0 lb

## 2022-05-16 DIAGNOSIS — N200 Calculus of kidney: Secondary | ICD-10-CM

## 2022-05-16 DIAGNOSIS — R310 Gross hematuria: Secondary | ICD-10-CM | POA: Diagnosis not present

## 2022-05-16 DIAGNOSIS — R3129 Other microscopic hematuria: Secondary | ICD-10-CM | POA: Diagnosis not present

## 2022-05-16 NOTE — Progress Notes (Signed)
05/16/2022 10:32 AM   Kristen Wong Kristen Wong 1965-06-27 [id number]  Referring provider: Marinda Elk, MD [place] [facility] [place],  [place] [address]  Chief Complaint  Patient presents with   Hematuria    HPI: Kristen Wong is a 56 y.o. female referred for evaluation of dysuria and hematuria.  She presents today with her daughter  Seen [facility] 04/01/2022 with complaints of low back pain, sensation of incomplete emptying and dysuria Urinalysis showed yeast, pyuria, bacteria and calcium oxalate crystals.  There were 9 RBCs per high-power field She was treated with Cipro for UTI though her urine culture grew mixed flora Seen in follow-up 04/15/2022 complaining of persistent symptoms UA at that visit showed 6 WBCs and bacteriuria.  Was treated with doxycycline. Denies gross hematuria She had a CT of the abdomen pelvis May 2023 which showed bilateral, nonobstructing renal calculi   PMH: Past Medical History:  Diagnosis Date   Arthritis    KNEES   Blood dyscrasia    POLYCYTHEMIA   COPD (chronic obstructive pulmonary disease) ([place])    Depression    H/O   Diabetes mellitus without complication ([place]) 4827   Type 2-NEWLY DX   Fatty liver    GERD (gastroesophageal reflux disease)    OCC   Heart murmur    ASYMPTOMATIC   Hypertension    Irritable bowel syndrome    Polycythemia     Surgical History: Past Surgical History:  Procedure Laterality Date   ABDOMINAL HYSTERECTOMY     CESAREAN SECTION     COLONOSCOPY WITH PROPOFOL N/A 08/08/2017   Procedure: COLONOSCOPY WITH PROPOFOL;  Surgeon: Manya Silvas, MD;  Location: [facility] ENDOSCOPY;  Service: Endoscopy;  Laterality: N/A;   COLONOSCOPY WITH PROPOFOL N/A 01/12/2021   Procedure: COLONOSCOPY WITH PROPOFOL;  Surgeon: [name], MD;  Location: [facility] ENDOSCOPY;  Service: Endoscopy;  Laterality: N/A;  DM   COLONOSCOPY WITH PROPOFOL N/A 05/11/2021   Procedure: COLONOSCOPY WITH  PROPOFOL;  Surgeon: [name], MD;  Location: [facility] ENDOSCOPY;  Service: Endoscopy;  Laterality: N/A;  DM   COLONOSCOPY WITH PROPOFOL N/A 02/08/2022   Procedure: COLONOSCOPY WITH PROPOFOL;  Surgeon: [name], MD;  Location: [facility] ENDOSCOPY;  Service: Endoscopy;  Laterality: N/A;   DIAGNOSTIC LAPAROSCOPY     ESOPHAGOGASTRODUODENOSCOPY (EGD) WITH PROPOFOL N/A 08/08/2017   Procedure: ESOPHAGOGASTRODUODENOSCOPY (EGD) WITH PROPOFOL;  Surgeon: Manya Silvas, MD;  Location: [facility] ENDOSCOPY;  Service: Endoscopy;  Laterality: N/A;   KNEE SURGERY     [facility]   LAPAROSCOPIC HYSTERECTOMY Bilateral 01/22/2017   Procedure: HYSTERECTOMY TOTAL LAPAROSCOPIC BSO;  Surgeon: Malachy Mood, MD;  Location: [facility] ORS;  Service: Gynecology;  Laterality: Bilateral;    Home Medications:  Allergies as of 05/16/2022       Reactions   Penicillin G Nausea Only   Has patient had a PCN reaction causing immediate rash, facial/tongue/throat swelling, SOB or lightheadedness with hypotension: No Has patient had a PCN reaction causing severe rash involving mucus membranes or skin necrosis: No Has patient had a PCN reaction that required hospitalization: No Has patient had a PCN reaction occurring within the last 10 years: No If all of the above answers are "NO", then may proceed with Cephalosporin use.        Medication List        Accurate as of May 16, 2022 10:32 AM. If you have any questions, ask your nurse or doctor.  Albuterol Sulfate 108 (90 Base) MCG/ACT Aepb Commonly known as: PROAIR RESPICLICK Inhale 2 Inhalers into the lungs every 6 (six) hours as needed.   benzonatate 200 MG capsule Commonly known as: TESSALON TAKE 1 CAPSULE BY MOUTH THREE TIMES DAILY AS NEEDED FOR COUGH FOR UP TO 7 DAYS   cetirizine 10 MG tablet Commonly known as: ZYRTEC Take 20 mg by mouth daily.   dicyclomine 10 MG capsule Commonly known as: BENTYL Take 10-20 mg by mouth every 6 (six) hours as  needed.   etodolac 400 MG tablet Commonly known as: LODINE Take 400 mg by mouth 2 (two) times daily.   ferrous sulfate 325 (65 FE) MG tablet Take 325 mg by mouth daily.   fluticasone 50 MCG/ACT nasal spray Commonly known as: FLONASE Place 2 sprays into both nostrils daily as needed for allergies or rhinitis.   HYDROcodone bit-homatropine 5-1.5 MG/5ML syrup Commonly known as: HYCODAN Take 5 mLs by mouth every 6 (six) hours as needed for cough.   hyoscyamine 0.375 MG 12 hr tablet Commonly known as: LEVBID   ibuprofen 600 MG tablet Commonly known as: ADVIL Take 1 tablet (600 mg total) by mouth every 6 (six) hours as needed for headache, moderate pain or cramping.   metFORMIN 500 MG tablet Commonly known as: Glucophage Take 1 tablet (500 mg total) by mouth 2 (two) times daily with a meal.   metoprolol tartrate 25 MG tablet Commonly known as: LOPRESSOR Take 25 mg by mouth 2 (two) times daily.   multivitamin capsule Take 1 capsule by mouth daily.   nystatin powder Commonly known as: MYCOSTATIN/NYSTOP Apply topically 2 (two) times daily. Until rash resolved.   omeprazole 40 MG capsule Commonly known as: PRILOSEC TAKE 1 CAPSULE BY MOUTH ONCE DAILY 30 MINUTES BEFORE BREAKFAST   oxyCODONE-acetaminophen 5-325 MG tablet Commonly known as: PERCOCET/ROXICET Take 1-2 tablets by mouth every 4 (four) hours as needed (moderate to severe pain (when tolerating fluids)).   PARoxetine 10 MG tablet Commonly known as: PAXIL TAKE 1 & 1 2 (ONE & ONE HALF) TABLETS BY MOUTH ONCE DAILY   predniSONE 20 MG tablet Commonly known as: DELTASONE TAKE 1 TABLET BY MOUTH ONCE DAILY FOR 5 DAYS   terconazole 0.8 % vaginal cream Commonly known as: Terazol 3 Place 1 applicator vaginally at bedtime.   traZODone 50 MG tablet Commonly known as: DESYREL Take 50 mg by mouth at bedtime.   triamcinolone cream 0.1 % Commonly known as: KENALOG APPLY CREAM EXTERNALLY TWICE DAILY        Allergies:   Allergies  Allergen Reactions   Penicillin G Nausea Only    Has patient had a PCN reaction causing immediate rash, facial/tongue/throat swelling, SOB or lightheadedness with hypotension: No Has patient had a PCN reaction causing severe rash involving mucus membranes or skin necrosis: No Has patient had a PCN reaction that required hospitalization: No Has patient had a PCN reaction occurring within the last 10 years: No If all of the above answers are "NO", then may proceed with Cephalosporin use.     Family History: Family History  Problem Relation Age of Onset   Diabetes Mother    Diabetes Father    Diabetes Sister    Breast cancer Neg Hx     Social History:  reports that she has been smoking cigarettes. She has a 60.00 pack-year smoking history. She has never used smokeless tobacco. She reports that she does not drink alcohol and does not use drugs.   Physical Exam:  BP (!) 148/80   Pulse (!) 102   Ht 5' 4"  (1.626 m)   Wt 226 lb (102.5 kg)   BMI 38.79 kg/m   Constitutional:  Alert and oriented, No acute distress. HEENT: [place] AT Respiratory: Normal respiratory effort, no increased work of breathing. Psychiatric: Normal mood and affect.   Pertinent Imaging: CT images were personally reviewed and interpreted    Assessment & Plan:   56 y.o. female with lower urinary tract symptoms, pyuria and microhematuria Urine culture was negative and would not recommend any further antibiotic therapy She was unable to give a urine specimen today and estimated bladder volume by bladder scan was 64 mL She was given a sterile specimen container and will bring in a urine later today or tomorrow She has right low back pain which is most likely musculoskeletal in etiology CT May 2023 remarkable for bilateral, nonobstructing renal calculi.  Recommend CT urogram to evaluate for the possibility of stone migration to the ureter or other urologic abnormalities.  Order was entered and will call  with results UA today with 3-10 RBCs.  Will need cystoscopy however will await CT results in the event she has a ureteral stone requiring ureteroscopic removal   Abbie Sons, MD  [place] [address] [place], [place] [address] [phone]

## 2022-05-17 LAB — URINALYSIS, COMPLETE
Bilirubin, UA: NEGATIVE
Glucose, UA: NEGATIVE
Ketones, UA: NEGATIVE
Leukocytes,UA: NEGATIVE
Nitrite, UA: NEGATIVE
Protein,UA: NEGATIVE
Specific Gravity, UA: <1.005 — ABNORMAL LOW (ref 1.005–1.030)
Urobilinogen, Ur: 0.2 mg/dL (ref 0.2–1.0)
pH, UA: 6 (ref 5.0–7.5)

## 2022-05-17 LAB — MICROSCOPIC EXAMINATION

## 2022-05-19 LAB — CULTURE, URINE COMPREHENSIVE

## 2022-05-20 NOTE — Addendum Note (Signed)
Addended by: Chrystie Nose on: 05/20/2022 03:38 PM   Modules accepted: Orders

## 2022-05-21 ENCOUNTER — Telehealth: Payer: Self-pay | Admitting: *Deleted

## 2022-05-21 NOTE — Telephone Encounter (Signed)
A urologic cause of her pain needs to be identified before I can order narcotic pain medication.  Needs to have CT-it has been ordered but not yet scheduled.  If she is having severe pain recommend ED visit

## 2022-05-21 NOTE — Telephone Encounter (Signed)
Notified patient as instructed,

## 2022-05-21 NOTE — Addendum Note (Signed)
Addended by: Chrystie Nose on: 05/21/2022 04:14 PM   Modules accepted: Orders

## 2022-05-21 NOTE — Telephone Encounter (Signed)
Pt calling asking for pain medication for her back. I advised that she could try some tylenol/ ibuprofen? Pt states those don't help. I asked was she still taking percocet on her med list and per pt that's old, she is taking Mobic.

## 2022-06-04 ENCOUNTER — Ambulatory Visit
Admission: RE | Admit: 2022-06-04 | Discharge: 2022-06-04 | Disposition: A | Discharge status: Home / Self Care | Payer: BC Managed Care – PPO | Source: Ambulatory Visit | Attending: Urology | Admitting: Urology

## 2022-06-04 DIAGNOSIS — N200 Calculus of kidney: Secondary | ICD-10-CM | POA: Diagnosis present

## 2022-06-04 DIAGNOSIS — R3129 Other microscopic hematuria: Secondary | ICD-10-CM | POA: Diagnosis present

## 2022-06-04 LAB — POCT I-STAT CREATININE: Creatinine, Ser: 0.4 mg/dL — ABNORMAL LOW (ref 0.44–1.00)

## 2022-06-04 MED ORDER — IOHEXOL 300 MG/ML  SOLN
125.0000 mL | Freq: Once | INTRAMUSCULAR | Status: AC | PRN
  Administered 2022-06-04: 125 mL via INTRAVENOUS

## 2022-06-06 ENCOUNTER — Telehealth: Payer: Self-pay | Admitting: *Deleted

## 2022-06-06 NOTE — Telephone Encounter (Signed)
Notified patient as instructed, Discussed follow-up appointments, patient agrees

## 2022-06-06 NOTE — Telephone Encounter (Signed)
-----   Message from Abbie Sons, MD sent at 06/05/2022  9:57 PM EST ----- CT shows nonobstructive renal calculi which would not be causing pain. No findings to suggest a urologic source of her pain. Since she has had hematuria recc scheduling cystoscopy for bladder evaluation.

## 2022-06-07 ENCOUNTER — Inpatient Hospital Stay: Payer: BC Managed Care – PPO | Attending: Oncology

## 2022-06-07 ENCOUNTER — Encounter: Payer: Self-pay | Admitting: Oncology

## 2022-06-07 ENCOUNTER — Inpatient Hospital Stay (HOSPITAL_BASED_OUTPATIENT_CLINIC_OR_DEPARTMENT_OTHER): Payer: BC Managed Care – PPO | Admitting: Oncology

## 2022-06-07 VITALS — BP 126/65 | HR 64 | Temp 96.4°F | Wt 221.2 lb

## 2022-06-07 DIAGNOSIS — D72829 Elevated white blood cell count, unspecified: Secondary | ICD-10-CM | POA: Insufficient documentation

## 2022-06-07 DIAGNOSIS — Z7982 Long term (current) use of aspirin: Secondary | ICD-10-CM | POA: Insufficient documentation

## 2022-06-07 DIAGNOSIS — Z716 Tobacco abuse counseling: Secondary | ICD-10-CM

## 2022-06-07 DIAGNOSIS — I10 Essential (primary) hypertension: Secondary | ICD-10-CM | POA: Diagnosis not present

## 2022-06-07 DIAGNOSIS — Z7984 Long term (current) use of oral hypoglycemic drugs: Secondary | ICD-10-CM | POA: Diagnosis not present

## 2022-06-07 DIAGNOSIS — F17210 Nicotine dependence, cigarettes, uncomplicated: Secondary | ICD-10-CM | POA: Insufficient documentation

## 2022-06-07 DIAGNOSIS — D751 Secondary polycythemia: Secondary | ICD-10-CM | POA: Insufficient documentation

## 2022-06-07 DIAGNOSIS — D72828 Other elevated white blood cell count: Secondary | ICD-10-CM

## 2022-06-07 DIAGNOSIS — E119 Type 2 diabetes mellitus without complications: Secondary | ICD-10-CM | POA: Diagnosis not present

## 2022-06-07 DIAGNOSIS — Z79899 Other long term (current) drug therapy: Secondary | ICD-10-CM | POA: Insufficient documentation

## 2022-06-07 LAB — CBC WITH DIFFERENTIAL/PLATELET
Abs Immature Granulocytes: 0.04 10*3/uL (ref 0.00–0.07)
Basophils Absolute: 0 10*3/uL (ref 0.0–0.1)
Basophils Relative: 0 %
Eosinophils Absolute: 0.2 10*3/uL (ref 0.0–0.5)
Eosinophils Relative: 2 %
HCT: 46.6 % — ABNORMAL HIGH (ref 36.0–46.0)
Hemoglobin: 16.2 g/dL — ABNORMAL HIGH (ref 12.0–15.0)
Immature Granulocytes: 0 %
Lymphocytes Relative: 49 %
Lymphs Abs: 5.7 10*3/uL — ABNORMAL HIGH (ref 0.7–4.0)
MCH: 30.7 pg (ref 26.0–34.0)
MCHC: 34.8 g/dL (ref 30.0–36.0)
MCV: 88.3 fL (ref 80.0–100.0)
Monocytes Absolute: 0.6 10*3/uL (ref 0.1–1.0)
Monocytes Relative: 5 %
Neutro Abs: 5.2 10*3/uL (ref 1.7–7.7)
Neutrophils Relative %: 44 %
Platelets: 366 10*3/uL (ref 150–400)
RBC: 5.28 MIL/uL — ABNORMAL HIGH (ref 3.87–5.11)
RDW: 13.1 % (ref 11.5–15.5)
WBC: 11.7 10*3/uL — ABNORMAL HIGH (ref 4.0–10.5)
nRBC: 0 % (ref 0.0–0.2)

## 2022-06-07 NOTE — Assessment & Plan Note (Signed)
Tobacco use, smoke cessation was discussed with patient. Patient gets annual lung cancer screening CT.

## 2022-06-07 NOTE — Progress Notes (Signed)
[facility] follow up visit  Patient Care Team: Marinda Elk, MD as PCP - General (Physician Assistant)  Reason of visit  Follow-up for erythrocytosis  ASSESSMENT & PLAN:   Secondary erythrocytosis #Secondary erythrocytosis, due to smoking. Previous work-up is consistent with secondary erythrocytosis. I will check JAK2 exon 12-15, BCR ABL 1 FISH. No need for phlebotomy at this point.  Leukocytosis Chronic leukocytosis, likely reactive due to smoking. Check peripheral blood flowcytometry  Tobacco abuse counseling Tobacco use, smoke cessation was discussed with patient. Patient gets annual lung cancer screening CT.  Orders Placed This Encounter  Procedures   CBC with Differential/Platelet    Standing Status:   Future    Standing Expiration Date:   06/07/2023   Follow up in 6 months All questions were answered. The patient knows to call the clinic with any problems, questions or concerns.  Earlie Server, MD, PhD [facility] Hematology Oncology 06/07/2022   HISTORY OF PRESENTING ILLNESS: Kristen Wong 56 y.o. 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  Saw pulmonology Dr.Fleming recently for dyspnea, COPD, on albuterol inhaler.  Lung nodules, <51m. Dr.Flemming recommend repeat CT in 4- 6 months.   Smoking cessation is discussed and she is not interested.  JAK2 with reflex to CARL/MPL is negative. Continue Aspirin 886mdaily.  Phlebotomy threshold is hematocrit 50 and above   INTERVAL HISTORY 89.o. female presents for follow up of elevated blood count.  Patient was last seen 2 years ago and lost follow-up.  Was referred back to reestablish care for She is currently everyday smoker,  2 packs/day Denies weight loss, fever,  chills, fatigue, night sweats.     Review of Systems  Constitutional:  Positive for fatigue. Negative for appetite change, chills, fever and unexpected weight change.  HENT:   Negative for hearing loss and voice change.   Eyes:  Negative for eye problems.  Respiratory:  Negative for chest tightness and cough.   Cardiovascular:  Negative for chest pain.  Gastrointestinal:  Negative for abdominal distention, abdominal pain and blood in stool.  Endocrine: Negative for hot flashes.  Genitourinary:  Negative for difficulty urinating and frequency.   Musculoskeletal:  Negative for arthralgias.  Skin:  Negative for itching and rash.  Neurological:  Negative for extremity weakness.  Hematological:  Negative for adenopathy.  Psychiatric/Behavioral:  Negative for confusion.     MEDICAL HISTORY: Past Medical History:  Diagnosis Date   Arthritis    KNEES   Blood dyscrasia    POLYCYTHEMIA   COPD (chronic obstructive pulmonary disease) (HCC)    Depression    H/O   Diabetes mellitus without complication ([id number] Type 2-NEWLY DX   Fatty liver    GERD (gastroesophageal reflux disease)    OCC   Heart murmur    ASYMPTOMATIC   Hypertension    Irritable bowel syndrome    Polycythemia     SURGICAL HISTORY: Past Surgical History:  Procedure Laterality Date   ABDOMINAL HYSTERECTOMY     CESAREAN SECTION     COLONOSCOPY WITH PROPOFOL N/A 08/08/2017   Procedure: COLONOSCOPY WITH PROPOFOL;  Surgeon: ElManya SilvasMD;  [facility];  Service: Endoscopy;  Laterality: N/A;   COLONOSCOPY WITH PROPOFOL N/A 01/12/2021   Procedure: COLONOSCOPY WITH PROPOFOL;  Surgeon: LoAndrey Farmer  T, MD;  Location: [facility] ENDOSCOPY;  Service: Endoscopy;  Laterality: N/A;  DM   COLONOSCOPY WITH PROPOFOL N/A 05/11/2021   Procedure: COLONOSCOPY WITH PROPOFOL;  Surgeon: [name], MD;  Location: [facility] ENDOSCOPY;  Service: Endoscopy;  Laterality: N/A;  DM   COLONOSCOPY WITH PROPOFOL N/A  02/08/2022   Procedure: COLONOSCOPY WITH PROPOFOL;  Surgeon: [name], MD;  Location: [facility] ENDOSCOPY;  Service: Endoscopy;  Laterality: N/A;   DIAGNOSTIC LAPAROSCOPY     ESOPHAGOGASTRODUODENOSCOPY (EGD) WITH PROPOFOL N/A 08/08/2017   Procedure: ESOPHAGOGASTRODUODENOSCOPY (EGD) WITH PROPOFOL;  Surgeon: Manya Silvas, MD;  Location[facility] END[facility]COPY;  Service: Endoscopy;  Laterality: N/A;   KNEE SURGERY     [facility]   LAPAROSCOPIC HYSTERECTOMY Bilateral 01/22/2017   Procedure: HYSTERECTOMY TOTAL LAPAROSCOPIC BSO;  Surgeon: Malachy Mood, MD;  Location: [facility] ORS;  Service: Gynecology;  Laterality: Bilateral;    SOCIAL HISTORY: Social History   Socioeconomic History   Marital status: Divorced    Spouse name: Not on file   Number of children: Not on file   Years of education: Not on file   Highest education level: Not on file  Occupational History   Not on file  Tobacco Use   Smoking status: Every Day    Packs/day: 2.00    Years: 30.00    Total pack years: 60.00    Types: Cigarettes   Smokeless tobacco: Never  Vaping Use   Vaping Use: Never used  Substance and Sexual Activity   Alcohol use: No   Drug use: No   Sexual activity: Not Currently  Other Topics Concern   Not on file  Social History Narrative   Not on file   Social Determinants of Health   Financial Resource Strain: Not on file  Food Insecurity: Not on file  Transportation Needs: Not on file  Physical Activity: Not on file  Stress: Not on file  Social Connections: Not on file  Intimate Partner Violence: Not on file    FAMILY HISTORY Family History  Problem Relation Age of Onset   Diabetes Mother    Diabetes Father    Diabetes Sister    Breast cancer Neg Hx     ALLERGIES:  is allergic to penicillin g.  MEDICATIONS:  Current Outpatient Medications  Medication Sig Dispense Refill   albuterol (VENTOLIN HFA) 108 (90 Base) MCG/ACT inhaler Inhale 2 puffs into the lungs every 6 (six) hours as  needed.     cetirizine (ZYRTEC) 10 MG tablet Take 20 mg by mouth daily.     etodolac (LODINE) 400 MG tablet Take 400 mg by mouth 2 (two) times daily.     fluticasone (FLONASE) 50 MCG/ACT nasal spray Place 2 sprays into both nostrils daily.     ibuprofen (ADVIL,MOTRIN) 600 MG tablet Take 1 tablet (600 mg total) by mouth every 6 (six) hours as needed for headache, moderate pain or cramping. 60 tablet 1   meloxicam (MOBIC) 15 MG tablet Take 15 mg by mouth daily.     metFORMIN (GLUCOPHAGE) 500 MG tablet Take 1 tablet (500 mg total) by mouth 2 (two) times daily with a meal. 60 tablet 0   metoprolol tartrate (LOPRESSOR) 25 MG tablet Take 25 mg by mouth 2 (two) times daily.     omeprazole (PRILOSEC) 40 MG capsule TAKE 1 CAPSULE BY MOUTH ONCE DAILY 30 MINUTES BEFORE BREAKFAST     PARoxetine (PAXIL) 10 MG tablet TAKE 1 & 1 2 (ONE & ONE HALF) TABLETS BY MOUTH ONCE DAILY  traZODone (DESYREL) 50 MG tablet Take 50 mg by mouth at bedtime.     No current facility-administered medications for this visit.    PHYSICAL EXAMINATION:  ECOG PERFORMANCE STATUS: 1 - Symptomatic but completely ambulatory  Vitals:   06/07/22 0955  BP: 126/65  Pulse: 64  Temp: (!) 96.4 F (35.8 C)  SpO2: 94%    Filed Weights   06/07/22 0955  Weight: 221 lb 3.2 oz (100.3 kg)    Physical Exam Constitutional:      General: She is not in acute distress.    Appearance: She is not diaphoretic.     Comments: Obese.   HENT:     Head: Normocephalic and atraumatic.     Nose: Nose normal.     Mouth/Throat:     Pharynx: No oropharyngeal exudate.  Eyes:     General: No scleral icterus.    Pupils: Pupils are equal, round, and reactive to light.  Cardiovascular:     Rate and Rhythm: Normal rate and regular rhythm.     Heart sounds: No murmur heard. Pulmonary:     Effort: Pulmonary effort is normal. No respiratory distress.     Breath sounds: No rales.  Chest:     Chest wall: No tenderness.  Abdominal:     General:  There is no distension.     Palpations: Abdomen is soft.     Tenderness: There is no abdominal tenderness.  Musculoskeletal:        General: Normal range of motion.     Cervical back: Normal range of motion and neck supple.  Skin:    General: Skin is warm and dry.     Findings: No erythema.  Neurological:     Mental Status: She is alert and oriented to person, place, and time.     Cranial Nerves: No cranial nerve deficit.     Motor: No abnormal muscle tone.     Coordination: Coordination normal.  Psychiatric:        Mood and Affect: Affect normal.     LABORATORY DATA: I have personally reviewed the data as listed:    Latest Ref Rng & Units 06/07/2022    9:47 AM 12/06/2021   12:14 PM 11/02/2019   12:46 PM  CBC  WBC 4.0 - 10.5 K/uL 11.7  12.5  13.3   Hemoglobin 12.0 - 15.0 g/dL 16.2  16.5  15.7   Hematocrit 36.0 - 46.0 % 46.6  48.0  44.9   Platelets 150 - 400 K/uL 366  356  360       Latest Ref Rng & Units 06/04/2022   11:14 AM 10/31/2021   10:19 AM 07/19/2019   12:58 PM  CMP  Glucose 70 - 99 mg/dL   193   BUN 6 - 20 mg/dL   13   Creatinine 0.44 - 1.00 mg/dL 0.40  0.50  0.44   Sodium 135 - 145 mmol/L   136   Potassium 3.5 - 5.1 mmol/L   3.5   Chloride 98 - 111 mmol/L   103   CO2 22 - 32 mmol/L   22   Calcium 8.9 - 10.3 mg/dL   8.9   Total Protein 6.5 - 8.1 g/dL   7.3   Total Bilirubin 0.3 - 1.2 mg/dL   0.4   Alkaline Phos 38 - 126 U/L   95   AST 15 - 41 U/L   53   ALT 0 - 44 U/L   66  RADIOGRAPHIC STUDIES: I have personally reviewed the radiological images as listed and agreed with the findings in the report. CT HEMATURIA WORKUP  Result Date: 06/05/2022 CLINICAL DATA:  Microscopic hematuria, back pain history of bladder and kidney stones. EXAM: CT ABDOMEN AND PELVIS WITHOUT AND WITH CONTRAST TECHNIQUE: Multidetector CT imaging of the abdomen and pelvis was performed following the standard protocol before and following the bolus administration of intravenous  contrast. RADIATION DOSE REDUCTION: This exam was performed according to the departmental dose-optimization program which includes automated exposure control, adjustment of the mA and/or kV according to patient size and/or use of iterative reconstruction technique. CONTRAST:  137m OMNIPAQUE IOHEXOL 300 MG/ML  SOLN COMPARISON:  CT Oct 31, 2021. FINDINGS: Lower chest: Multiple pulmonary nodules measuring 2-4 mm in the bilateral lower lobes are unchanged compared to prior chest CT December 04, 2018 compatible with a benign process. Hepatobiliary: No suspicious hepatic lesion. Cholelithiasis without findings of acute cholecystitis. No biliary ductal dilation. Pancreas: No pancreatic ductal dilation or evidence of acute inflammation. Spleen: No splenomegaly or focal splenic lesion. Adrenals/Urinary Tract: Bilateral adrenal glands appear normal. No hydronephrosis. Bilateral nonobstructive renal stones measure up to 3 mm. No obstructive ureteral or bladder calculus identified. No solid enhancing renal mass. Tiny hypodense bilateral renal lesions are technically too small to accurately characterize but statistically likely reflect cysts and considered benign requiring no independent imaging follow-up. Kidneys demonstrate symmetric enhancement and excretion of contrast material. No collecting system duplication. No suspicious filling defect identified within the opacified portions of the collecting systems or ureters on delayed imaging. Although the distal bilateral ureters are not well opacified limiting evaluation. Evaluation of the urinary bladder is limited by urine contamination. Within this context there is no suspicious intraluminal filling defect or focal wall thickening. Stomach/Bowel: Stomach is unremarkable for degree of distension. No pathologic dilation of small or large bowel. No evidence of acute bowel inflammation. Vascular/Lymphatic: Aortic atherosclerosis. No pathologically enlarged abdominal or pelvic lymph  nodes. Reproductive: Status post hysterectomy. No adnexal masses. Other: No significant abdominopelvic free fluid. Musculoskeletal: No acute osseous abnormality. Multilevel degenerative changes spine. Stable benign intramuscular lipoma in the right vastus lateralis. IMPRESSION: 1. Bilateral nonobstructive renal stones measure up to 3 mm. 2. No obstructive ureteral or bladder calculus identified. 3. No solid enhancing renal mass. 4. Cholelithiasis without findings of acute cholecystitis. 5. Aortic Atherosclerosis (ICD10-I70.0). Electronically Signed   By: JDahlia BailiffM.D.   On: 06/05/2022 15:31

## 2022-06-07 NOTE — Assessment & Plan Note (Signed)
Chronic leukocytosis, likely reactive due to smoking. Check peripheral blood flowcytometry

## 2022-06-07 NOTE — Assessment & Plan Note (Signed)
#  Secondary erythrocytosis, due to smoking. Previous work-up is consistent with secondary erythrocytosis. I will check JAK2 exon 12-15, BCR ABL 1 FISH. No need for phlebotomy at this point.

## 2022-06-11 LAB — COMP PANEL: LEUKEMIA/LYMPHOMA

## 2022-06-27 ENCOUNTER — Other Ambulatory Visit: Payer: BC Managed Care – PPO | Admitting: Urology

## 2022-07-25 ENCOUNTER — Other Ambulatory Visit: Payer: BC Managed Care – PPO | Admitting: Urology

## 2022-08-28 ENCOUNTER — Encounter: Payer: Self-pay | Admitting: Urology

## 2022-08-28 ENCOUNTER — Ambulatory Visit (INDEPENDENT_AMBULATORY_CARE_PROVIDER_SITE_OTHER): Payer: BC Managed Care – PPO | Admitting: Urology

## 2022-08-28 VITALS — BP 138/101 | HR 100 | Ht 64.0 in | Wt 221.0 lb

## 2022-08-28 DIAGNOSIS — R3129 Other microscopic hematuria: Secondary | ICD-10-CM | POA: Diagnosis not present

## 2022-08-28 DIAGNOSIS — N200 Calculus of kidney: Secondary | ICD-10-CM | POA: Diagnosis not present

## 2022-08-28 LAB — MICROSCOPIC EXAMINATION: Epithelial Cells (non renal): >10 /hpf — AB (ref 0–10)

## 2022-08-28 LAB — URINALYSIS, COMPLETE
Bilirubin, UA: NEGATIVE
Ketones, UA: NEGATIVE
Leukocytes,UA: NEGATIVE
Nitrite, UA: NEGATIVE
Protein,UA: NEGATIVE
Specific Gravity, UA: 1.01 (ref 1.005–1.030)
Urobilinogen, Ur: 0.2 mg/dL (ref 0.2–1.0)
pH, UA: 5.5 (ref 5.0–7.5)

## 2022-08-28 NOTE — Progress Notes (Signed)
   08/28/22  CC:  Chief Complaint  Patient presents with   Cysto    HPI: Refer to my prior note 05/16/2022.  CT urogram showed bilateral, nonobstructing renal calculi.  Blood pressure (!) 138/101, pulse 100, height 5\' 4"  (1.626 m), weight 221 lb (100.2 kg). NED. A&Ox3.   No respiratory distress   Abd soft, NT, ND Normal external genitalia with patent urethral meatus  Cystoscopy Procedure Note  Patient identification was confirmed, informed consent was obtained, and patient was prepped using Betadine solution.  Lidocaine jelly was administered per urethral meatus.    Procedure: - Flexible cystoscope introduced, without any difficulty.   - Thorough search of the bladder revealed:    normal urethral meatus    normal urothelium    no stones    no ulcers     no tumors    no urethral polyps    no trabeculation  - Ureteral orifices were normal in position and appearance.  Post-Procedure: - Patient tolerated the procedure well  Assessment/ Plan: We discussed nonobstructing renal calculi would not be a cause of her back pain.  Recommend 1 year follow-up with KUB No lower tract abnormalities on cystoscopy   Abbie Sons, MD

## 2022-08-29 ENCOUNTER — Other Ambulatory Visit: Payer: Self-pay | Admitting: Physician Assistant

## 2022-08-29 DIAGNOSIS — Z1231 Encounter for screening mammogram for malignant neoplasm of breast: Secondary | ICD-10-CM

## 2022-11-25 ENCOUNTER — Ambulatory Visit: Payer: BC Managed Care – PPO

## 2022-11-25 LAB — MICROALBUMIN / CREATININE URINE RATIO: Microalb Creat Ratio: 187.1

## 2022-11-25 LAB — PROTEIN / CREATININE RATIO, URINE
Albumin, U: 139
Creatinine, Urine: 74.3

## 2022-11-27 ENCOUNTER — Encounter: Payer: Self-pay | Admitting: *Deleted

## 2022-11-27 ENCOUNTER — Telehealth: Payer: Self-pay | Admitting: *Deleted

## 2022-11-27 NOTE — Telephone Encounter (Signed)
Left message for pt to call back to reschedule lung screening CT. Also mailed letter to pt.

## 2022-12-05 ENCOUNTER — Telehealth: Payer: Self-pay | Admitting: Oncology

## 2022-12-05 NOTE — Telephone Encounter (Signed)
Pt cancelled future appts due to no insurance right now

## 2022-12-06 ENCOUNTER — Inpatient Hospital Stay: Payer: BC Managed Care – PPO | Admitting: Oncology

## 2022-12-06 ENCOUNTER — Inpatient Hospital Stay: Payer: BC Managed Care – PPO

## 2023-03-10 IMAGING — CT CT CHEST LUNG CANCER SCREENING LOW DOSE W/O CM
2 of 5 series · 15 of 40 positions shown, 18 images · non-contrast
Comparison: 12/04/2026 chest CT.

CLINICAL DATA: 56-year-old asymptomatic female current smoker with
60 pack-year smoking history.



[Series 3: lung 1.00 · axial · 0.76mm/px · z∈[-1256,-955]mm · 12 of 333 slices shown, 15 images]
[im 16/333  mediastinal]
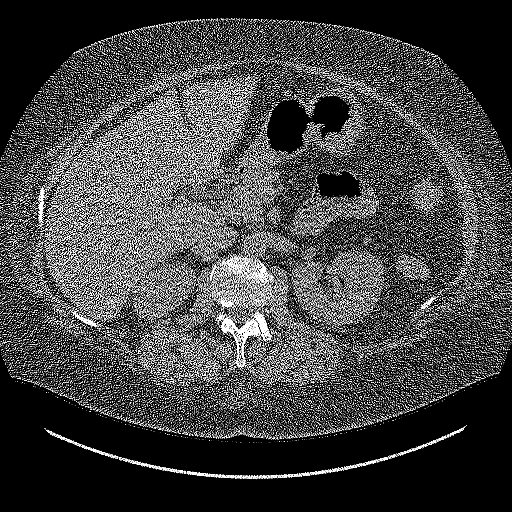
[im 16/333  lung]
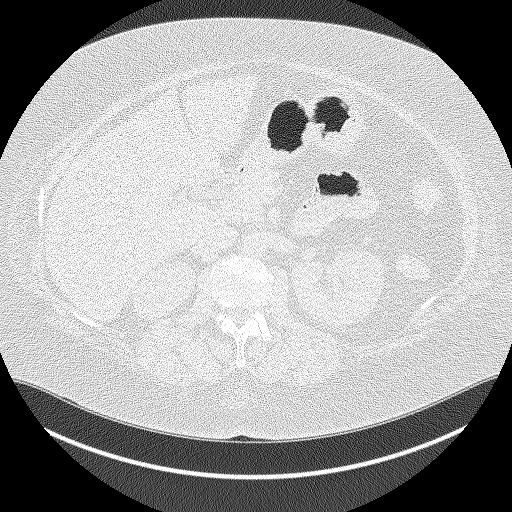
[im 46/333  lung]
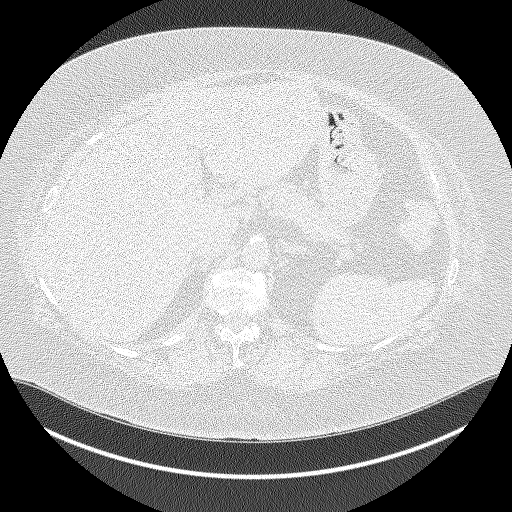
[im 76/333  lung]
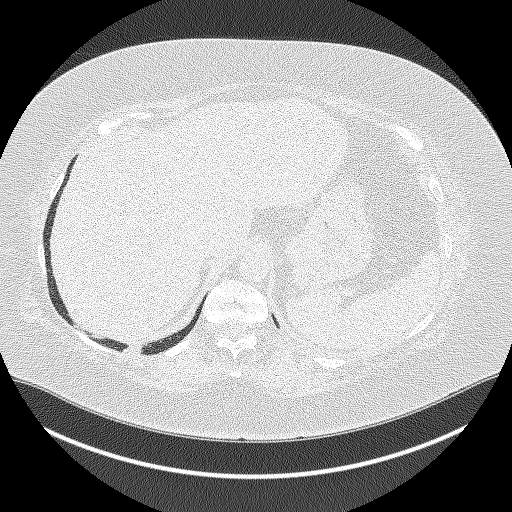
[im 106/333  lung]
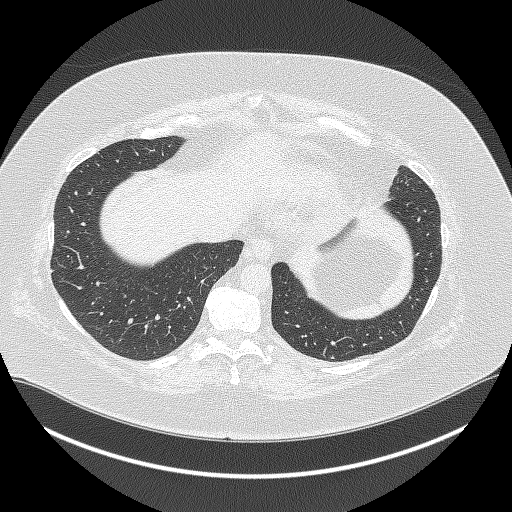
[im 121/333  mediastinal]
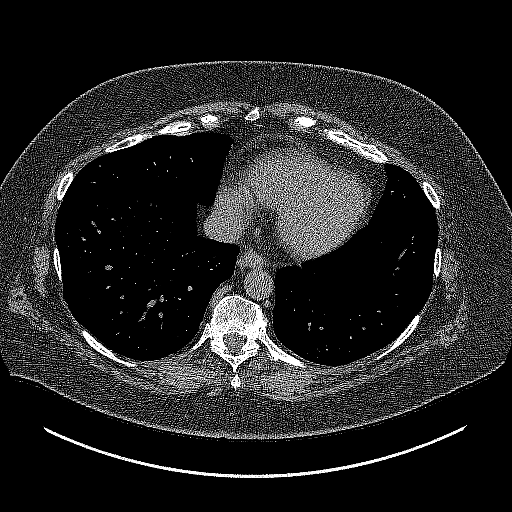
[im 121/333  lung]
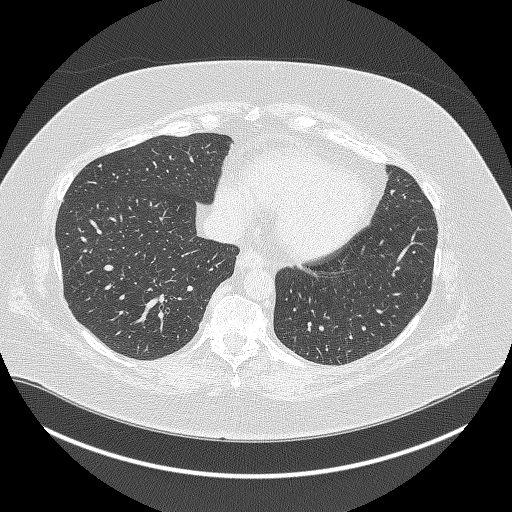
[im 151/333  lung]
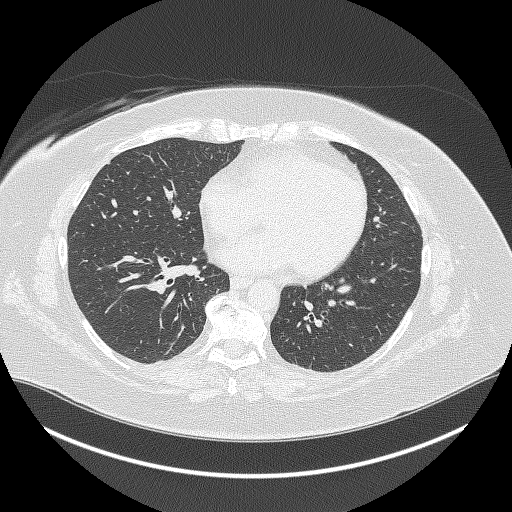
[im 182/333  lung]
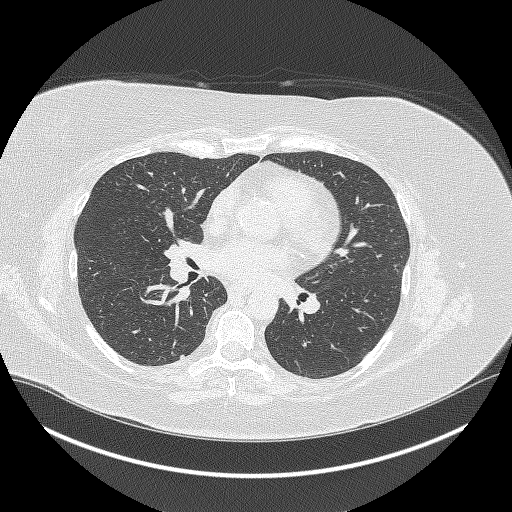
[im 212/333  lung]
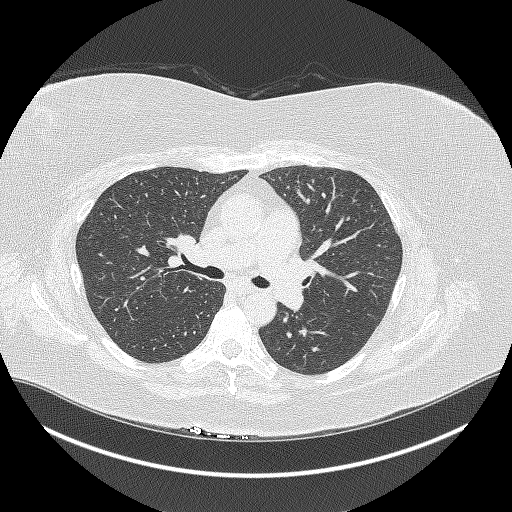
[im 227/333  mediastinal]
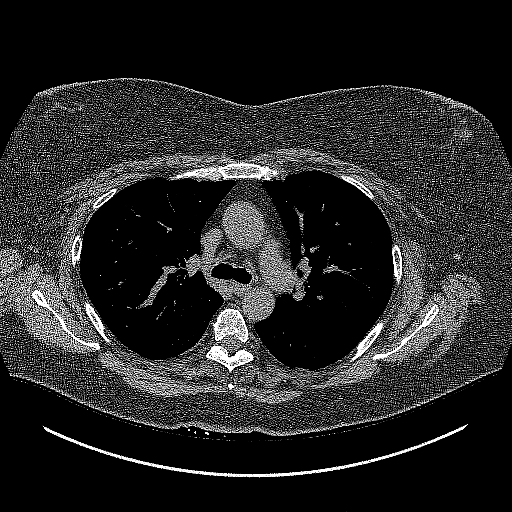
[im 227/333  lung]
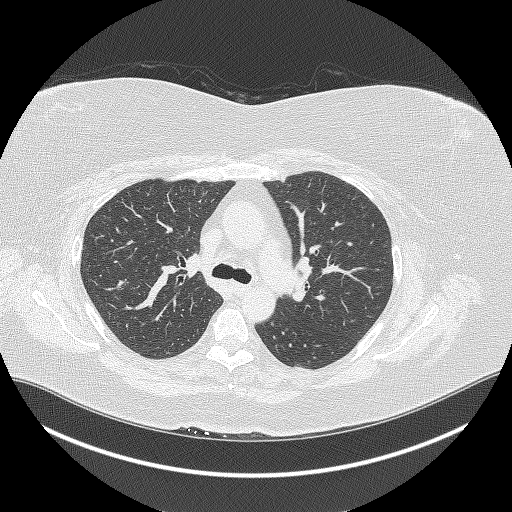
[im 257/333  lung]
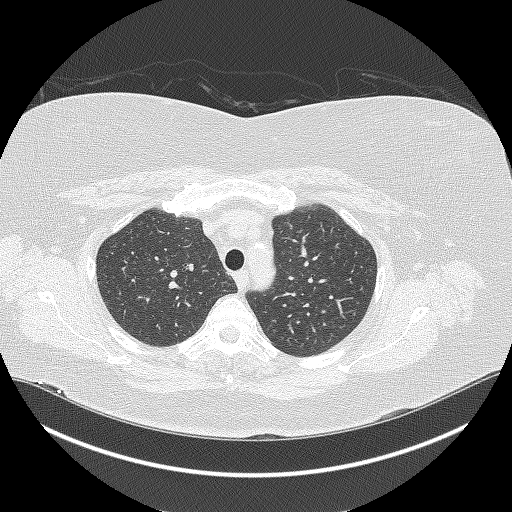
[im 287/333  lung]
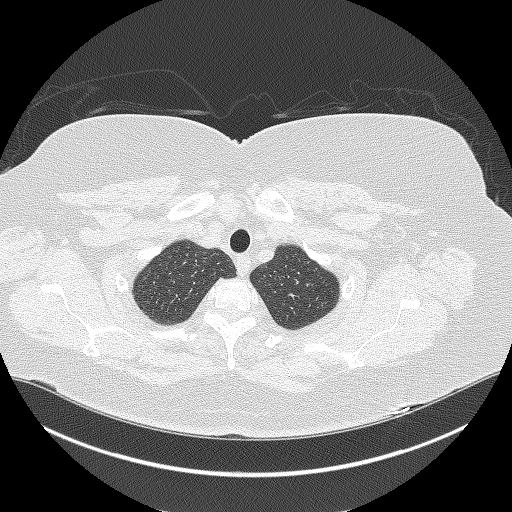
[im 317/333  lung]
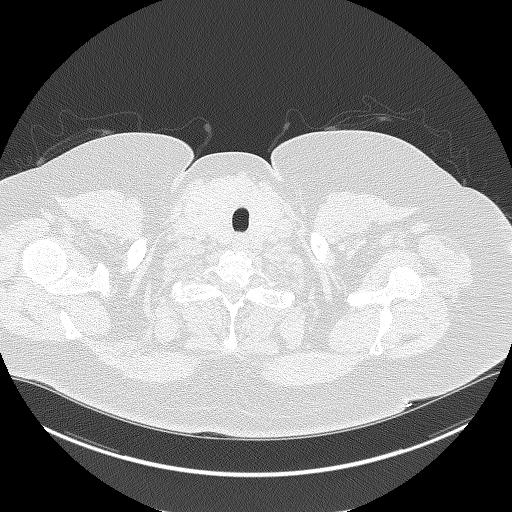

[Series 5: coronals lung 1.00 cor · coronal · 0.65mm/px · 3 of 354 slices shown]
[im 71/354  lung]
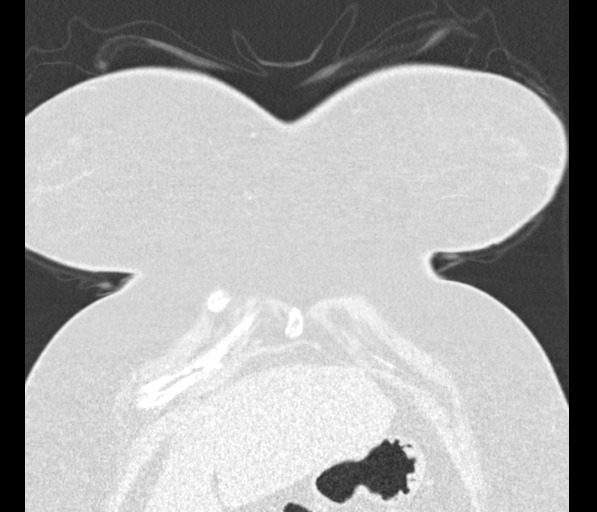
[im 142/354  lung]
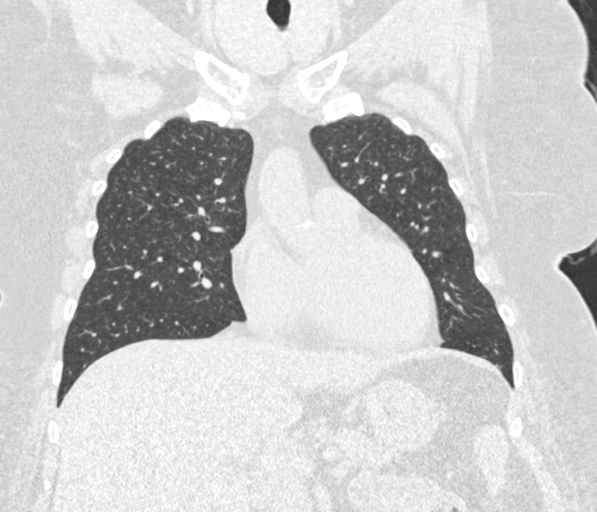
[im 212/354  lung]
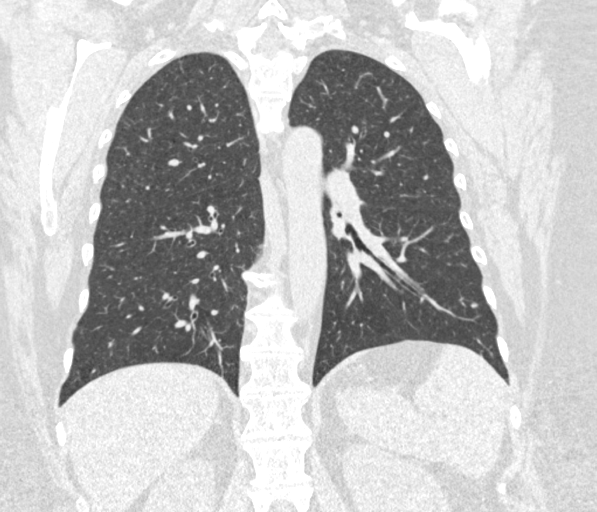

[15 of 40 positions shown; findings below may reference images not displayed]

FINDINGS: Cardiovascular: Normal heart size. No significant pericardial
effusion/thickening. Atherosclerotic nonaneurysmal thoracic aorta.
Normal caliber pulmonary arteries.

Mediastinum/Nodes: Stable goiter asymmetrically enlarged on the
right without discrete thyroid nodules by CT. Unremarkable
esophagus. No pathologically enlarged axillary, mediastinal or hilar
lymph nodes, noting limited sensitivity for the detection of hilar
adenopathy on this noncontrast study.

Lungs/Pleura: No pneumothorax. No pleural effusion. Mild
centrilobular emphysema with diffuse bronchial wall thickening. No
acute consolidative airspace disease or lung masses. Numerous
scattered solid bilateral pulmonary nodules, largest 5.4 mm in
volume derived mean diameter in the posterior left lower lobe
(series 3/image 131), not appreciably changed from 12/04/2018 CT.

Upper abdomen: No acute abnormality.

Musculoskeletal: No aggressive appearing focal osseous lesions. Mild
thoracic spondylosis.
IMPRESSION: 1. Lung-RADS 2, benign appearance or behavior. Continue annual
screening with low-dose chest CT without contrast in 12 months.
2. Stable goiter.
3. Aortic Atherosclerosis (8X4TW-KYG.G) and Emphysema ([id number].W).

## 2023-04-16 LAB — HEMOGLOBIN A1C: Hemoglobin A1C: 8.4

## 2023-07-01 ENCOUNTER — Ambulatory Visit (INDEPENDENT_AMBULATORY_CARE_PROVIDER_SITE_OTHER): Payer: Medicaid Other

## 2023-07-01 ENCOUNTER — Other Ambulatory Visit: Payer: Self-pay | Admitting: Family Medicine

## 2023-07-01 ENCOUNTER — Encounter: Payer: Self-pay | Admitting: Family Medicine

## 2023-07-01 ENCOUNTER — Ambulatory Visit: Payer: Medicaid Other | Admitting: Family Medicine

## 2023-07-01 VITALS — BP 124/68 | HR 96 | Temp 98.0°F | Resp 18 | Ht 65.0 in | Wt 217.0 lb

## 2023-07-01 DIAGNOSIS — F39 Unspecified mood [affective] disorder: Secondary | ICD-10-CM | POA: Diagnosis not present

## 2023-07-01 DIAGNOSIS — J432 Centrilobular emphysema: Secondary | ICD-10-CM

## 2023-07-01 DIAGNOSIS — Z1159 Encounter for screening for other viral diseases: Secondary | ICD-10-CM

## 2023-07-01 DIAGNOSIS — E538 Deficiency of other specified B group vitamins: Secondary | ICD-10-CM

## 2023-07-01 DIAGNOSIS — E559 Vitamin D deficiency, unspecified: Secondary | ICD-10-CM

## 2023-07-01 DIAGNOSIS — E118 Type 2 diabetes mellitus with unspecified complications: Secondary | ICD-10-CM

## 2023-07-01 DIAGNOSIS — E1159 Type 2 diabetes mellitus with other circulatory complications: Secondary | ICD-10-CM

## 2023-07-01 DIAGNOSIS — E049 Nontoxic goiter, unspecified: Secondary | ICD-10-CM | POA: Diagnosis not present

## 2023-07-01 DIAGNOSIS — Z114 Encounter for screening for human immunodeficiency virus [HIV]: Secondary | ICD-10-CM

## 2023-07-01 DIAGNOSIS — I700 Atherosclerosis of aorta: Secondary | ICD-10-CM | POA: Insufficient documentation

## 2023-07-01 DIAGNOSIS — M1990 Unspecified osteoarthritis, unspecified site: Secondary | ICD-10-CM

## 2023-07-01 DIAGNOSIS — E785 Hyperlipidemia, unspecified: Secondary | ICD-10-CM

## 2023-07-01 DIAGNOSIS — Z1211 Encounter for screening for malignant neoplasm of colon: Secondary | ICD-10-CM

## 2023-07-01 DIAGNOSIS — Z1231 Encounter for screening mammogram for malignant neoplasm of breast: Secondary | ICD-10-CM

## 2023-07-01 DIAGNOSIS — I152 Hypertension secondary to endocrine disorders: Secondary | ICD-10-CM

## 2023-07-01 DIAGNOSIS — E1169 Type 2 diabetes mellitus with other specified complication: Secondary | ICD-10-CM

## 2023-07-01 DIAGNOSIS — Z122 Encounter for screening for malignant neoplasm of respiratory organs: Secondary | ICD-10-CM

## 2023-07-01 LAB — CBC WITH DIFFERENTIAL/PLATELET
Basophils Absolute: 0 10*3/uL (ref 0.0–0.1)
Basophils Relative: 0.5 % (ref 0.0–3.0)
Eosinophils Absolute: 0.1 10*3/uL (ref 0.0–0.7)
Eosinophils Relative: 1.2 % (ref 0.0–5.0)
HCT: 46.9 % — ABNORMAL HIGH (ref 36.0–46.0)
Hemoglobin: 15.8 g/dL — ABNORMAL HIGH (ref 12.0–15.0)
Lymphocytes Relative: 41 % (ref 12.0–46.0)
Lymphs Abs: 3.4 10*3/uL (ref 0.7–4.0)
MCHC: 33.7 g/dL (ref 30.0–36.0)
MCV: 90.2 fL (ref 78.0–100.0)
Monocytes Absolute: 0.4 10*3/uL (ref 0.1–1.0)
Monocytes Relative: 5.3 % (ref 3.0–12.0)
Neutro Abs: 4.3 10*3/uL (ref 1.4–7.7)
Neutrophils Relative %: 52 % (ref 43.0–77.0)
Platelets: 261 10*3/uL (ref 150.0–400.0)
RBC: 5.2 Mil/uL — ABNORMAL HIGH (ref 3.87–5.11)
RDW: 14.5 % (ref 11.5–15.5)
WBC: 8.3 10*3/uL (ref 4.0–10.5)

## 2023-07-01 LAB — COMPREHENSIVE METABOLIC PANEL
ALT: 110 U/L — ABNORMAL HIGH (ref 0–35)
AST: 149 U/L — ABNORMAL HIGH (ref 0–37)
Albumin: 4 g/dL (ref 3.5–5.2)
Alkaline Phosphatase: 108 U/L (ref 39–117)
BUN: 6 mg/dL (ref 6–23)
CO2: 26 meq/L (ref 19–32)
Calcium: 9.3 mg/dL (ref 8.4–10.5)
Chloride: 97 meq/L (ref 96–112)
Creatinine, Ser: 0.41 mg/dL (ref 0.40–1.20)
GFR: 108 mL/min (ref >60.00)
Glucose, Bld: 168 mg/dL — ABNORMAL HIGH (ref 70–99)
Potassium: 3.9 meq/L (ref 3.5–5.1)
Sodium: 135 meq/L (ref 135–145)
Total Bilirubin: 0.7 mg/dL (ref 0.2–1.2)
Total Protein: 7.8 g/dL (ref 6.0–8.3)

## 2023-07-01 LAB — VITAMIN B12: Vitamin B-12: 554 pg/mL (ref 211–911)

## 2023-07-01 LAB — MICROALBUMIN / CREATININE URINE RATIO
Creatinine,U: 59.4 mg/dL
Microalb Creat Ratio: 39.7 mg/g — ABNORMAL HIGH (ref 0.0–30.0)
Microalb, Ur: 23.5 mg/dL — ABNORMAL HIGH (ref 0.0–1.9)

## 2023-07-01 LAB — TSH: TSH: 2.43 u[IU]/mL (ref 0.35–5.50)

## 2023-07-01 LAB — VITAMIN D 25 HYDROXY (VIT D DEFICIENCY, FRACTURES): VITD: 11.17 ng/mL — ABNORMAL LOW (ref 30.00–100.00)

## 2023-07-01 MED ORDER — BREZTRI AEROSPHERE 160-9-4.8 MCG/ACT IN AERO: 2.0000 | INHALATION_SPRAY | Freq: Two times a day (BID) | RESPIRATORY_TRACT | Status: DC

## 2023-07-01 MED ORDER — BREZTRI AEROSPHERE 160-9-4.8 MCG/ACT IN AERO: 2.0000 | INHALATION_SPRAY | Freq: Two times a day (BID) | RESPIRATORY_TRACT | 3 refills | Status: DC

## 2023-07-01 NOTE — Progress Notes (Signed)
SUBJECTIVE:   Chief Complaint  Patient presents with   Establish Care   HPI Presents to clinic to establish care  Discussed the use of AI scribe software for clinical note transcription with the patient, who gave verbal consent to proceed.  History of Present Illness The patient, a former employee at [employer], presents to establish care after a change in insurance due to recent disability status. She reports a history of diabetes, which is currently uncontrolled with an A1c of 8.4, despite recent doubling of metformin dosage. The patient also has a history of hypertension, managed with metoprolol, and hyperlipidemia, managed with atorvastatin.  The patient has a history of COPD, for which she was previously under the care of a pulmonologist. She reports an increase in the frequency of albuterol use, indicating a possible exacerbation of her COPD symptoms. The patient has a significant smoking history, reporting two packs per day since her teenage years.  The patient also reports a history of depression, managed with Paxil and Trazodone. She previously sought counseling for her depression during a period of medical leave from work. The patient also has a history of arthritis, which she reports is not well-managed with etodolac, and instead, she uses over-the-counter Voltaren and Icy Hot for relief.  The patient has a history of gastrointestinal issues, including fatty liver disease, kidney stones, and irritable bowel syndrome. She previously saw a gastroenterologist but had to cancel a scheduled colonoscopy due to lack of insurance. The patient also reports a history of a blood disorder, polycythemia, managed with low-dose aspirin.  The patient underwent a hysterectomy in 2018 for fibroids and has not had a Pap smear since. She also reports frequent ear infections and recently completed a course of antibiotics. The patient has been experiencing increased shortness of breath, which she attributes  to recent bronchitis. She has had two rounds of antibiotics but still has persistent symptoms.      PERTINENT PMH / PSH: As above  OBJECTIVE:  BP 124/68   Pulse 96   Temp 98 F (36.7 C)   Resp 18   Ht 5\' 5"  (1.651 m)   Wt 217 lb (98.4 kg)   SpO2 92%   BMI 36.11 kg/m    Physical Exam Vitals reviewed.  Constitutional:      General: She is not in acute distress.    Appearance: She is not ill-appearing.  HENT:     Head: Normocephalic.     Right Ear: Tympanic membrane, ear canal and external ear normal.     Left Ear: Tympanic membrane, ear canal and external ear normal.     Nose: Nose normal.     Mouth/Throat:     Mouth: Mucous membranes are moist.  Eyes:     Extraocular Movements: Extraocular movements intact.     Conjunctiva/sclera: Conjunctivae normal.     Pupils: Pupils are equal, round, and reactive to light.  Neck:     Thyroid: No thyromegaly or thyroid tenderness.     Vascular: No carotid bruit.  Cardiovascular:     Rate and Rhythm: Normal rate and regular rhythm.     Pulses: Normal pulses.     Heart sounds: Normal heart sounds.  Pulmonary:     Effort: Pulmonary effort is normal.     Breath sounds: No wheezing or rhonchi.  Abdominal:     General: Bowel sounds are normal. There is no distension.     Palpations: Abdomen is soft.     Tenderness: There is no abdominal  tenderness. There is no right CVA tenderness, left CVA tenderness, guarding or rebound.  Musculoskeletal:        General: Normal range of motion.     Cervical back: Normal range of motion.     Right lower leg: No edema.     Left lower leg: No edema.  Lymphadenopathy:     Cervical: No cervical adenopathy.  Skin:    Capillary Refill: Capillary refill takes less than 2 seconds.  Neurological:     General: No focal deficit present.     Mental Status: She is alert and oriented to person, place, and time. Mental status is at baseline.     Motor: No weakness.  Psychiatric:        Mood and Affect:  Mood normal.        Behavior: Behavior normal.        Thought Content: Thought content normal.        Judgment: Judgment normal.           07/01/2023   11:23 AM 12/31/2016    3:28 PM  Depression screen PHQ 2/9  Decreased Interest 3 0  Down, Depressed, Hopeless 3 0  PHQ - 2 Score 6 0  Altered sleeping 2   Tired, decreased energy 3   Change in appetite 3   Feeling bad or failure about yourself  2   Trouble concentrating 0   Moving slowly or fidgety/restless 0   Suicidal thoughts 1   PHQ-9 Score 17   Difficult doing work/chores Very difficult       07/01/2023   11:23 AM  GAD 7 : Generalized Anxiety Score  Nervous, Anxious, on Edge 1  Control/stop worrying 2  Worry too much - different things 2  Trouble relaxing 1  Restless 1  Easily annoyed or irritable 1  Afraid - awful might happen 1  Total GAD 7 Score 9  Anxiety Difficulty Somewhat difficult    ASSESSMENT/PLAN:  Type 2 diabetes mellitus with complications (HCC) Assessment & Plan: Poorly controlled with an A1c of 8.4. Proteinuria noted, indicating possible diabetic nephropathy. Currently on Metformin 2000mg  daily. -Consider adding Farxiga to regimen to improve glycemic control and renal protection. -Check kidney function before initiating Farxiga. -Follow up in 4 weeks to reassess A1c and discuss potential medication changes.  Orders: -     Microalbumin / creatinine urine ratio  Mood disorder (HCC) Assessment & Plan: Currently on Paxil 10mg  daily. Recent significant life stressors including loss of mother and job. -Consider adjusting Paxil dosage or changing medication at next visit. Currently on Buspar twice daily. -Continue current therapy. Currently on Trazodone 50mg  at bedtime. -Continue current therapy. -Encourage continuation of counseling if possible.   Hypertension associated with diabetes (HCC) Assessment & Plan: Currently on Metoprolol 25mg  twice daily. -Consider switching from Metoprolol to  Carvedilol for better blood pressure control and less impact on blood glucose levels. -Consider adding Losartan for renal protection.   Morbid obesity (HCC) Assessment & Plan: Multiple comorbidities -Would benefit from weight loss, GLP1 weekly injection in future   Vitamin B 12 deficiency -     Vitamin B12  Centrilobular emphysema (HCC) Assessment & Plan: Increased use of Albuterol rescue inhaler. No maintenance therapy currently in place. History of smoking 2 packs per day since adolescence. -Consider adding a maintenance inhaler for better control of COPD. -Order chest X-ray to assess current lung status. -Encourage smoking cessation.  Orders: -     CBC with Differential/Platelet -  Comprehensive metabolic panel -     DG Chest 2 View; Future  Aortic atherosclerosis (HCC) Assessment & Plan: On statin therapy   Vitamin D deficiency -     VITAMIN D 25 Hydroxy (Vit-D Deficiency, Fractures) -     Vitamin D (Ergocalciferol); Take 1 capsule (50,000 Units total) by mouth every 7 (seven) days.  Dispense: 12 capsule; Refill: 1  Hyperlipidemia associated with type 2 diabetes mellitus (HCC) Assessment & Plan: Currently on Atorvastatin 20mg  daily. -Continue current therapy.   Goiter -     TSH  Screening for lung cancer -     Ambulatory Referral for Lung Cancer Scre  Breast cancer screening by mammogram -     3D Screening Mammogram, Left and Right; Future  Colon cancer screening -     Ambulatory referral to Gastroenterology  Encounter for screening for HIV -     HIV Antibody (routine testing w rflx)  Need for hepatitis C screening test -     Hepatitis C antibody  Arthritis Assessment & Plan: Not currently taking prescribed Etodolac. Using over-the-counter Voltaren and Icy Hot for pain relief. -Consider other options for arthritis pain management, taking into account patient's stomach issues and kidney stones.     PDMP reviewed  Return in about 4 weeks (around  07/29/2023) for PCP, DM.  Dana Allan, MD

## 2023-07-01 NOTE — Patient Instructions (Addendum)
It was a pleasure meeting you today. Thank you for allowing me to take part in your health care.  Our goals for today as we discussed include:  We will get some labs today.  If they are abnormal or we need to do something about them, I will call you.  If they are normal, I will send you a message on MyChart (if it is active) or a letter in the mail.  If you don't hear from [place] in 2 weeks, please call the office at the number below.   Chest xray today  Start Breztri 2 puffs two times a day.  Sample provided  Referral sent for Colonoscopy, Lung cancer screening  Referral sent for Mammogram. Please call to schedule appointment. [facility] [address] [place], [place] [address] [phone]    Please schedule an annual eye exam  Foot exam at next visit  Recommend Shingles vaccine.  This is a 2 dose series and can be given at your local pharmacy.  Please talk to your pharmacist about this.   Recommend Hepatitis B vaccination.  2 doses 1 month apart.  This is a list of the screening recommended for you and due dates:  Health Maintenance  Topic Date Due   Complete foot exam   Never done   Eye exam for diabetics  Never done   HIV Screening  Never done   Hepatitis C Screening  Never done   Zoster (Shingles) Vaccine (1 of 2) Never done   Mammogram  02/28/2019   Yearly kidney function blood test for diabetes  07/18/2020   Screening for Lung Cancer  11/24/2022   COVID-19 Vaccine (3 - 2024-25 season) 02/09/2023   Hemoglobin A1C  10/14/2023   Yearly kidney health urinalysis for diabetes  11/25/2023   DTaP/Tdap/Td vaccine (2 - Td or Tdap) 06/21/2026   Colon Cancer Screening  02/09/2032   Pneumococcal Vaccination  Completed   Flu Shot  Completed   HPV Vaccine  Aged Out    Follow up in 1 month  If you have any questions or concerns, please do not hesitate to call the office at [phone].  I look forward to our next visit and until then take care and stay  safe.  Regards,   Dana Allan, MD   [facility]

## 2023-07-01 NOTE — Progress Notes (Signed)
Medication Samples have been provided to the patient.  Drug name: Markus Daft        Strength: 195mcg/9mcg/4.8 mcg per Inhalation        Qty: 1  LOT: [id number] Exp.Date: 03/10/2025   Dosing instructions:  Inhale 2 puffs into the lungs in the morning and at bedtime.   The patient has been instructed regarding the correct time, dose, and frequency of taking this medication, including desired effects and most common side effects.   Tresa Endo  Nashiya Disbrow 11:17 AM 07/01/2023

## 2023-07-02 ENCOUNTER — Other Ambulatory Visit: Payer: Self-pay

## 2023-07-02 DIAGNOSIS — F17210 Nicotine dependence, cigarettes, uncomplicated: Secondary | ICD-10-CM

## 2023-07-02 DIAGNOSIS — Z122 Encounter for screening for malignant neoplasm of respiratory organs: Secondary | ICD-10-CM

## 2023-07-02 DIAGNOSIS — Z87891 Personal history of nicotine dependence: Secondary | ICD-10-CM

## 2023-07-02 LAB — HIV ANTIBODY (ROUTINE TESTING W REFLEX): HIV 1&2 Ab, 4th Generation: NONREACTIVE

## 2023-07-02 LAB — HEPATITIS C ANTIBODY: Hepatitis C Ab: NONREACTIVE

## 2023-07-02 NOTE — Telephone Encounter (Signed)
Copied from CRM [id number]. Topic: Clinical - Prescription Issue >> Jul 02, 2023 12:52 PM Theodis Sato wrote: Reason for CRM: Patient states she gave Dr. Clent Ridges a list of all the medications she needed re-filled and called to check on the status as she is almost out of all of her medications. Patient declined to  re list all the medication she needed re-filled again to agent.  Please sent re-fills to  [id number] - [place], [place] - [address] [place] [address]

## 2023-07-06 ENCOUNTER — Encounter: Payer: Self-pay | Admitting: Family

## 2023-07-08 ENCOUNTER — Encounter: Payer: Self-pay | Admitting: Acute Care

## 2023-07-09 NOTE — Telephone Encounter (Signed)
Copied from CRM [id number]. Topic: General - Other >> Jul 09, 2023 11:09 AM Steele Sizer wrote: Reason for CRM: Pt stated that she received a call yesterday and would like to request a callback

## 2023-07-13 ENCOUNTER — Encounter: Payer: Self-pay | Admitting: Family Medicine

## 2023-07-13 DIAGNOSIS — E538 Deficiency of other specified B group vitamins: Secondary | ICD-10-CM

## 2023-07-13 DIAGNOSIS — I152 Hypertension secondary to endocrine disorders: Secondary | ICD-10-CM | POA: Insufficient documentation

## 2023-07-13 DIAGNOSIS — M1990 Unspecified osteoarthritis, unspecified site: Secondary | ICD-10-CM | POA: Insufficient documentation

## 2023-07-13 DIAGNOSIS — Z1159 Encounter for screening for other viral diseases: Secondary | ICD-10-CM | POA: Insufficient documentation

## 2023-07-13 DIAGNOSIS — E049 Nontoxic goiter, unspecified: Secondary | ICD-10-CM | POA: Insufficient documentation

## 2023-07-13 DIAGNOSIS — Z1211 Encounter for screening for malignant neoplasm of colon: Secondary | ICD-10-CM | POA: Insufficient documentation

## 2023-07-13 DIAGNOSIS — Z1231 Encounter for screening mammogram for malignant neoplasm of breast: Secondary | ICD-10-CM | POA: Insufficient documentation

## 2023-07-13 DIAGNOSIS — E1169 Type 2 diabetes mellitus with other specified complication: Secondary | ICD-10-CM | POA: Insufficient documentation

## 2023-07-13 DIAGNOSIS — E559 Vitamin D deficiency, unspecified: Secondary | ICD-10-CM | POA: Insufficient documentation

## 2023-07-13 DIAGNOSIS — Z114 Encounter for screening for human immunodeficiency virus [HIV]: Secondary | ICD-10-CM | POA: Insufficient documentation

## 2023-07-13 DIAGNOSIS — J432 Centrilobular emphysema: Secondary | ICD-10-CM | POA: Insufficient documentation

## 2023-07-13 DIAGNOSIS — F39 Unspecified mood [affective] disorder: Secondary | ICD-10-CM | POA: Insufficient documentation

## 2023-07-13 DIAGNOSIS — Z122 Encounter for screening for malignant neoplasm of respiratory organs: Secondary | ICD-10-CM | POA: Insufficient documentation

## 2023-07-13 HISTORY — DX: Deficiency of other specified B group vitamins: E53.8

## 2023-07-13 MED ORDER — VITAMIN D (ERGOCALCIFEROL) 1.25 MG (50000 UNIT) PO CAPS: 50000.0000 [IU] | ORAL_CAPSULE | ORAL | 1 refills | Status: DC

## 2023-07-13 NOTE — Assessment & Plan Note (Signed)
Currently on Metoprolol 25mg  twice daily. -Consider switching from Metoprolol to Carvedilol for better blood pressure control and less impact on blood glucose levels. -Consider adding Losartan for renal protection.

## 2023-07-13 NOTE — Assessment & Plan Note (Signed)
 On statin therapy

## 2023-07-13 NOTE — Assessment & Plan Note (Addendum)
Currently on Paxil 10mg  daily. Recent significant life stressors including loss of mother and job. -Consider adjusting Paxil dosage or changing medication at next visit. Currently on Buspar twice daily. -Continue current therapy. Currently on Trazodone 50mg  at bedtime. -Continue current therapy. -Encourage continuation of counseling if possible.

## 2023-07-13 NOTE — Assessment & Plan Note (Signed)
Not currently taking prescribed Etodolac. Using over-the-counter Voltaren and Icy Hot for pain relief. -Consider other options for arthritis pain management, taking into account patient's stomach issues and kidney stones.

## 2023-07-13 NOTE — Assessment & Plan Note (Signed)
Increased use of Albuterol rescue inhaler. No maintenance therapy currently in place. History of smoking 2 packs per day since adolescence. -Consider adding a maintenance inhaler for better control of COPD. -Order chest X-ray to assess current lung status. -Encourage smoking cessation.

## 2023-07-13 NOTE — Assessment & Plan Note (Signed)
Poorly controlled with an A1c of 8.4. Proteinuria noted, indicating possible diabetic nephropathy. Currently on Metformin 2000mg  daily. -Consider adding Farxiga to regimen to improve glycemic control and renal protection. -Check kidney function before initiating Farxiga. -Follow up in 4 weeks to reassess A1c and discuss potential medication changes.

## 2023-07-13 NOTE — Assessment & Plan Note (Signed)
Multiple comorbidities -Would benefit from weight loss, GLP1 weekly injection in future

## 2023-07-13 NOTE — Assessment & Plan Note (Signed)
Currently on Atorvastatin 20mg  daily. -Continue current therapy.

## 2023-07-14 ENCOUNTER — Encounter: Payer: Self-pay | Admitting: *Deleted

## 2023-07-28 ENCOUNTER — Encounter: Payer: Self-pay | Admitting: Family Medicine

## 2023-07-28 ENCOUNTER — Ambulatory Visit: Payer: Medicaid Other | Admitting: Family Medicine

## 2023-07-28 ENCOUNTER — Encounter: Payer: Self-pay | Admitting: Emergency Medicine

## 2023-07-28 VITALS — BP 126/70 | HR 81 | Temp 98.2°F | Resp 18 | Ht 65.0 in | Wt 214.2 lb

## 2023-07-28 DIAGNOSIS — R0789 Other chest pain: Secondary | ICD-10-CM | POA: Diagnosis not present

## 2023-07-28 DIAGNOSIS — I152 Hypertension secondary to endocrine disorders: Secondary | ICD-10-CM | POA: Diagnosis not present

## 2023-07-28 DIAGNOSIS — J432 Centrilobular emphysema: Secondary | ICD-10-CM

## 2023-07-28 DIAGNOSIS — E1159 Type 2 diabetes mellitus with other circulatory complications: Secondary | ICD-10-CM | POA: Diagnosis not present

## 2023-07-28 DIAGNOSIS — Z122 Encounter for screening for malignant neoplasm of respiratory organs: Secondary | ICD-10-CM

## 2023-07-28 DIAGNOSIS — E785 Hyperlipidemia, unspecified: Secondary | ICD-10-CM

## 2023-07-28 DIAGNOSIS — E118 Type 2 diabetes mellitus with unspecified complications: Secondary | ICD-10-CM

## 2023-07-28 DIAGNOSIS — J449 Chronic obstructive pulmonary disease, unspecified: Secondary | ICD-10-CM | POA: Insufficient documentation

## 2023-07-28 DIAGNOSIS — E1169 Type 2 diabetes mellitus with other specified complication: Secondary | ICD-10-CM

## 2023-07-28 DIAGNOSIS — R7989 Other specified abnormal findings of blood chemistry: Secondary | ICD-10-CM

## 2023-07-28 DIAGNOSIS — Z7984 Long term (current) use of oral hypoglycemic drugs: Secondary | ICD-10-CM

## 2023-07-28 HISTORY — DX: Other chest pain: R07.89

## 2023-07-28 MED ORDER — CARVEDILOL 3.125 MG PO TABS: 3.1250 mg | ORAL_TABLET | Freq: Two times a day (BID) | ORAL | 3 refills | Status: DC

## 2023-07-28 MED ORDER — DAPAGLIFLOZIN PROPANEDIOL 5 MG PO TABS
5.0000 mg | ORAL_TABLET | Freq: Every day | ORAL | Status: DC
Start: 2023-07-28 — End: 2023-08-04

## 2023-07-28 MED ORDER — DAPAGLIFLOZIN PROPANEDIOL 5 MG PO TABS
10.0000 mg | ORAL_TABLET | Freq: Every day | ORAL | 3 refills | Status: DC
Start: 2023-07-28 — End: 2023-07-30

## 2023-07-28 MED ORDER — ALBUTEROL SULFATE HFA 108 (90 BASE) MCG/ACT IN AERS: 1.0000 | INHALATION_SPRAY | Freq: Four times a day (QID) | RESPIRATORY_TRACT | 3 refills | Status: AC | PRN

## 2023-07-28 NOTE — Assessment & Plan Note (Signed)
 Using Breztri sample inhaler with good effect. Previously on Anoro Ellipta. -Has refills on Anoro Ellipta prescription. Start after completion of Breztri -Consider referral to pulmonology.

## 2023-07-28 NOTE — Progress Notes (Signed)
 SUBJECTIVE:   Chief Complaint  Patient presents with   Medical Management of Chronic Issues    4 week follow up   HPI Presents for follow up chronic disease management  Discussed the use of AI scribe software for clinical note transcription with the patient, who gave verbal consent to proceed.  History of Present Illness Kristen Wong is a 58 year old female with diabetes and elevated liver enzymes who presents for follow-up on her liver function and diabetes management.  She has a history of elevated liver enzymes, specifically AST and ALT, with fluctuations over the past six years. She recently discontinued her cholesterol medication on January 6th due to concerns it might be contributing to the elevated liver enzymes.  She has diabetes and is currently taking metformin, 2000 mg daily. She does not have a glucose monitor at home as her previous one broke, and she experiences difficulty with finger pricks causing numbness. She is interested in a continuous glucose monitoring system but is unsure if her insurance will cover it. She is not currently on insulin.  She experiences left-sided chest pain that started about a week ago, described as persistent and exacerbated by movement and coughing. The pain is localized to the left side of her chest and back, and she has been experiencing some shortness of breath at times. She has not tried any specific treatments for the pain, such as Tylenol, due to concerns about stomach issues, but has been taking Advil.  She uses Breztri inhaler for her COPD, taking two puffs twice a day, and Flonase nasal spray, two sprays in each nostril twice a day. She reports coughing up green mucus, which she attributes to sinus issues rather than lung problems. She has a history of pneumonia and is concerned about recurrent infections despite having received a pneumonia vaccine.      PERTINENT PMH / PSH: As above  OBJECTIVE:  BP 126/70   Pulse 81   Temp 98.2  F (36.8 C)   Resp 18   Ht 5\' 5"  (1.651 m)   Wt 214 lb 4 oz (97.2 kg)   SpO2 93%   BMI 35.65 kg/m    Physical Exam Vitals reviewed.  Constitutional:      General: She is not in acute distress.    Appearance: Normal appearance. She is obese. She is not ill-appearing, toxic-appearing or diaphoretic.  Eyes:     General:        Right eye: No discharge.        Left eye: No discharge.     Conjunctiva/sclera: Conjunctivae normal.  Cardiovascular:     Rate and Rhythm: Normal rate and regular rhythm.     Heart sounds: Normal heart sounds.  Pulmonary:     Effort: Pulmonary effort is normal.     Breath sounds: Normal breath sounds.  Chest:     Chest wall: No swelling or tenderness.  Abdominal:     General: Bowel sounds are normal.  Musculoskeletal:        General: Normal range of motion.  Skin:    General: Skin is warm and dry.  Neurological:     General: No focal deficit present.     Mental Status: She is alert and oriented to person, place, and time. Mental status is at baseline.  Psychiatric:        Mood and Affect: Mood normal.        Behavior: Behavior normal.        Thought  Content: Thought content normal.        Judgment: Judgment normal.           07/28/2023    3:40 PM 07/01/2023   11:23 AM 12/31/2016    3:28 PM  Depression screen PHQ 2/9  Decreased Interest 0 3 0  Down, Depressed, Hopeless 2 3 0  PHQ - 2 Score 2 6 0  Altered sleeping 3 2   Tired, decreased energy 3 3   Change in appetite 3 3   Feeling bad or failure about yourself  2 2   Trouble concentrating 0 0   Moving slowly or fidgety/restless 0 0   Suicidal thoughts 0 1   PHQ-9 Score 13 17   Difficult doing work/chores Not difficult at all Very difficult       07/28/2023    3:40 PM 07/01/2023   11:23 AM  GAD 7 : Generalized Anxiety Score  Nervous, Anxious, on Edge 2 1  Control/stop worrying 3 2  Worry too much - different things 3 2  Trouble relaxing 1 1  Restless 0 1  Easily annoyed or  irritable 0 1  Afraid - awful might happen 2 1  Total GAD 7 Score 11 9  Anxiety Difficulty Somewhat difficult Somewhat difficult    ASSESSMENT/PLAN:  Type 2 diabetes mellitus with complications (HCC) Assessment & Plan: On Metformin 2g daily. A1c not checked due to recent adjustment of Metformin dose. Recent UACR showing increased protein. -Start Farxiga 5 mg daily x 2 weeks.  Then increase to 10 mg if tolerating well. -Check A1c in 2-3 weeks.   Orders: -     Hemoglobin A1c; Future  Hypertension associated with diabetes (HCC) Assessment & Plan: On Metoprolol 25mg  BID. Considering switch to Carvedilol for potential better glycemic control. -Switch Metoprolol to Carvedilol 3.125mg  BID.  Orders: -     Carvedilol; Take 1 tablet (3.125 mg total) by mouth 2 (two) times daily with a meal.  Dispense: 60 tablet; Refill: 3  Elevated LFTs Assessment & Plan: Chronic elevation of AST and ALT, possibly due to statin use for hyperlipidemia. Patient stopped statin on 06/16/2023. History of hepatic steatosis noted on CT abd 12/11/2016 -Order repeat liver function and GGT tests in 2-3 weeks. -If liver enzymes normalize, consider re-challenge with Crestor.  Orders: -     Comprehensive metabolic panel; Future -     Gamma GT; Future  Centrilobular emphysema (HCC) Assessment & Plan: Using Breztri sample inhaler with good effect. Previously on Anoro Ellipta. -Has refills on Anoro Ellipta prescription. Start after completion of Breztri -Consider referral to pulmonology.  Orders: -     Albuterol Sulfate HFA; Inhale 1-2 puffs into the lungs every 6 (six) hours as needed.  Dispense: 8.7 g; Refill: 3  Hyperlipidemia associated with type 2 diabetes mellitus (HCC) Assessment & Plan: Previously on statin, stopped due to potential liver enzyme elevation. -If liver enzymes normalize, consider re-challenge with Crestor.   Screening for lung cancer -     Ambulatory Referral for Lung Cancer  Scre  Atypical chest pain Assessment & Plan: New left-sided chest pain, possibly musculoskeletal due to recent coughing. No cardiac history. -Order EKG to rule out cardiac etiology. -EKG: unchanged from previous tracings. No STEMI.    Orders: -     EKG 12-Lead   PDMP reviewed  Return in about 3 months (around 10/25/2023) for PCP, DM. HTN.  Dana Allan, MD

## 2023-07-28 NOTE — Assessment & Plan Note (Addendum)
 New left-sided chest pain, possibly musculoskeletal due to recent coughing. No cardiac history. -Order EKG to rule out cardiac etiology. -EKG: unchanged from previous tracings. No STEMI.

## 2023-07-28 NOTE — Assessment & Plan Note (Addendum)
 Chronic elevation of AST and ALT, possibly due to statin use for hyperlipidemia. Patient stopped statin on 06/16/2023. History of hepatic steatosis noted on CT abd 12/11/2016 -Order repeat liver function and GGT tests in 2-3 weeks. -If liver enzymes normalize, consider re-challenge with Crestor.

## 2023-07-28 NOTE — Patient Instructions (Addendum)
 It was a pleasure meeting you today. Thank you for allowing me to take part in your health care.  Our goals for today as we discussed include:  Will check A1c today.   Start Farxiga 5 mg daily. Sample provided for 2 weeks.  Increase to 10 mg after two weeks Continue Metformin 1000 mg two times a day  Stop Metoprolol Start Carvedilol 3.125 mg two times a day   Continue the Breztri 2 puffs two times a day.  Once completed can start Ellipta for your COPD.  1 puff daily.  Schedule lab appointment to have repeat liver functions.  For your chest. EKG did not show any significant changes from previous EKG.  No acute injury  Lidocaine patch every 12 hours as needed Diclofenac gel every 6 hours as needed Heat/Ice as needed  Vitamin D low.  Continue Vitamin D weekly. Once completed can take over the counter Vitamin D daily.  Referral sent for Mammogram. Please call to schedule appointment. [facility] [address] [place], [place] [address] [phone]     Referral sent for Lung cancer screening.  They will call you with an appointment   This is a list of the screening recommended for you and due dates:  Health Maintenance  Topic Date Due   Eye exam for diabetics  Never done   Zoster (Shingles) Vaccine (1 of 2) Never done   Mammogram  02/28/2019   Screening for Lung Cancer  11/24/2022   COVID-19 Vaccine (3 - 2024-25 season) 02/09/2023   Hemoglobin A1C  10/14/2023   Yearly kidney function blood test for diabetes  06/30/2024   Yearly kidney health urinalysis for diabetes  06/30/2024   Complete foot exam   07/27/2024   DTaP/Tdap/Td vaccine (2 - Td or Tdap) 06/21/2026   Colon Cancer Screening  02/09/2032   Pneumococcal Vaccination  Completed   Flu Shot  Completed   Hepatitis C Screening  Completed   HIV Screening  Completed   HPV Vaccine  Aged Out     If you have any questions or concerns, please do not hesitate to call the office at [phone].  I  look forward to our next visit and until then take care and stay safe.  Regards,   Dana Allan, MD   [facility]

## 2023-07-28 NOTE — Assessment & Plan Note (Signed)
 On Metformin 2g daily. A1c not checked due to recent adjustment of Metformin dose. Recent UACR showing increased protein. -Start Farxiga 5 mg daily x 2 weeks.  Then increase to 10 mg if tolerating well. -Check A1c in 2-3 weeks.

## 2023-07-28 NOTE — Progress Notes (Signed)
 Medication Samples have been provided to the patient.  Drug name: farxiga       Strength: 5 mg        Qty: 1  LOT: [id number]  Exp.Date: 01-07-25  Dosing instructions: 1 tablet by mouth daily   The patient has been instructed regarding the correct time, dose, and frequency of taking this medication, including desired effects and most common side effects.   Kristen Wong 12:13 PM 07/28/2023

## 2023-07-28 NOTE — Assessment & Plan Note (Addendum)
 On Metoprolol 25mg  BID. Considering switch to Carvedilol for potential better glycemic control. -Switch Metoprolol to Carvedilol 3.125mg  BID.

## 2023-07-28 NOTE — Assessment & Plan Note (Signed)
 Previously on statin, stopped due to potential liver enzyme elevation. -If liver enzymes normalize, consider re-challenge with Crestor.

## 2023-07-30 ENCOUNTER — Telehealth: Payer: Self-pay

## 2023-07-30 ENCOUNTER — Other Ambulatory Visit: Payer: Self-pay | Admitting: Family Medicine

## 2023-07-30 ENCOUNTER — Other Ambulatory Visit (HOSPITAL_COMMUNITY): Payer: Self-pay

## 2023-07-30 DIAGNOSIS — E118 Type 2 diabetes mellitus with unspecified complications: Secondary | ICD-10-CM

## 2023-07-30 MED ORDER — DAPAGLIFLOZIN PROPANEDIOL 5 MG PO TABS: 5.0000 mg | ORAL_TABLET | Freq: Every day | ORAL | Status: AC

## 2023-07-30 MED ORDER — DAPAGLIFLOZIN PROPANEDIOL 10 MG PO TABS: 10.0000 mg | ORAL_TABLET | Freq: Every day | ORAL | 11 refills | Status: DC

## 2023-07-30 NOTE — Telephone Encounter (Signed)
 Pharmacy Patient Advocate Encounter   Received notification from  [facility] fax  that prior authorization for Farxiga 5mg  is required/requested.   Insurance verification completed.   The patient is insured through  [facility]  .   Per test claim:  1 TABLET OF 10 MG FARXIGA, VS 2 TABLETS OF 5MG  FARXIGA is preferred by the insurance.  If suggested medication is appropriate, Please send in a new RX and discontinue this one. If not, please advise as to why it's not appropriate so that we may request a Prior Authorization. Please note, some preferred medications may still require a PA.  If the suggested medications have not been trialed and there are no contraindications to their use, the PA will not be submitted, as it will not be approved.

## 2023-08-03 ENCOUNTER — Encounter: Payer: Self-pay | Admitting: Family Medicine

## 2023-08-12 ENCOUNTER — Other Ambulatory Visit: Payer: Medicaid Other

## 2023-08-12 DIAGNOSIS — E118 Type 2 diabetes mellitus with unspecified complications: Secondary | ICD-10-CM | POA: Diagnosis not present

## 2023-08-12 DIAGNOSIS — R945 Abnormal results of liver function studies: Secondary | ICD-10-CM | POA: Diagnosis not present

## 2023-08-12 DIAGNOSIS — R7989 Other specified abnormal findings of blood chemistry: Secondary | ICD-10-CM | POA: Diagnosis not present

## 2023-08-12 LAB — COMPREHENSIVE METABOLIC PANEL
ALT: 176 U/L — ABNORMAL HIGH (ref 0–35)
AST: 157 U/L — ABNORMAL HIGH (ref 0–37)
Albumin: 4 g/dL (ref 3.5–5.2)
Alkaline Phosphatase: 100 U/L (ref 39–117)
BUN: 10 mg/dL (ref 6–23)
CO2: 30 meq/L (ref 19–32)
Calcium: 9.6 mg/dL (ref 8.4–10.5)
Chloride: 101 meq/L (ref 96–112)
Creatinine, Ser: 0.48 mg/dL (ref 0.40–1.20)
GFR: 103.89 mL/min (ref >60.00)
Glucose, Bld: 167 mg/dL — ABNORMAL HIGH (ref 70–99)
Potassium: 4.2 meq/L (ref 3.5–5.1)
Sodium: 139 meq/L (ref 135–145)
Total Bilirubin: 0.6 mg/dL (ref 0.2–1.2)
Total Protein: 7.8 g/dL (ref 6.0–8.3)

## 2023-08-12 LAB — HEMOGLOBIN A1C: Hgb A1c MFr Bld: 7.5 % — ABNORMAL HIGH (ref 4.6–6.5)

## 2023-08-12 LAB — GAMMA GT: GGT: 143 U/L — ABNORMAL HIGH (ref 7–51)

## 2023-08-26 ENCOUNTER — Other Ambulatory Visit: Payer: Self-pay | Admitting: Physician Assistant

## 2023-08-26 ENCOUNTER — Ambulatory Visit (INDEPENDENT_AMBULATORY_CARE_PROVIDER_SITE_OTHER): Payer: Medicaid Other | Admitting: Physician Assistant

## 2023-08-26 ENCOUNTER — Other Ambulatory Visit: Payer: Self-pay

## 2023-08-26 ENCOUNTER — Ambulatory Visit
Admission: RE | Admit: 2023-08-26 | Discharge: 2023-08-26 | Disposition: A | Discharge status: Home / Self Care | Attending: Physician Assistant | Admitting: Physician Assistant

## 2023-08-26 ENCOUNTER — Ambulatory Visit
Admission: RE | Admit: 2023-08-26 | Discharge: 2023-08-26 | Disposition: A | Discharge status: Home / Self Care | Source: Ambulatory Visit | Attending: Physician Assistant | Admitting: Physician Assistant

## 2023-08-26 VITALS — BP 139/81 | HR 86 | Ht 65.0 in | Wt 215.0 lb

## 2023-08-26 DIAGNOSIS — N200 Calculus of kidney: Secondary | ICD-10-CM

## 2023-08-26 DIAGNOSIS — K5900 Constipation, unspecified: Secondary | ICD-10-CM | POA: Diagnosis not present

## 2023-08-26 DIAGNOSIS — R3129 Other microscopic hematuria: Secondary | ICD-10-CM | POA: Diagnosis not present

## 2023-08-26 MED ORDER — POLYETHYLENE GLYCOL 3350 17 GM/SCOOP PO POWD: 17.0000 g | Freq: Every day | ORAL | 2 refills | Status: DC

## 2023-08-26 NOTE — Progress Notes (Signed)
 08/26/2023 9:31 AM   Waverly Ferrari Laurene Footman 1965/11/20 [id number]  CC: Chief Complaint  Patient presents with   Nephrolithiasis   HPI: Kristen Wong is a 58 y.o. female with PMH microscopic hematuria with benign workup in 2023 and nephrolithiasis who presents today for annual follow-up.  She is accompanied today by her daughter.  Today she reports he recently had gross hematuria in the setting of UTI symptoms.  Those records are not available to me.  She denies gross hematuria otherwise.  No flank pain or stone passage.  She is primarily bothered by abdominal bloating today.  She sees gastroenterology at [facility], but is not on a bowel regimen.  KUB today with notable stool burden.  I see some nonobstructing left renal stones.  No radiopaque ureteral stones.  PMH: Past Medical History:  Diagnosis Date   Arthritis    KNEES   Blood dyscrasia    POLYCYTHEMIA   COPD (chronic obstructive pulmonary disease) (HCC)    Depression    H/O   Diabetes mellitus without complication (HCC) 2018   Type 2-NEWLY DX   Fatty liver    Fatty liver    GERD (gastroesophageal reflux disease)    OCC   Heart murmur    ASYMPTOMATIC   Hypertension    Irritable bowel syndrome    Polycythemia     Surgical History: Past Surgical History:  Procedure Laterality Date   ABDOMINAL HYSTERECTOMY     CESAREAN SECTION     COLONOSCOPY WITH PROPOFOL N/A 08/08/2017   Procedure: COLONOSCOPY WITH PROPOFOL;  Surgeon: Scot Jun, MD;  Location: [facility] ENDOSCOPY;  Service: Endoscopy;  Laterality: N/A;   COLONOSCOPY WITH PROPOFOL N/A 01/12/2021   Procedure: COLONOSCOPY WITH PROPOFOL;  Surgeon: Regis Bill, MD;  Location: [facility] ENDOSCOPY;  Service: Endoscopy;  Laterality: N/A;  DM   COLONOSCOPY WITH PROPOFOL N/A 05/11/2021   Procedure: COLONOSCOPY WITH PROPOFOL;  Surgeon: Regis Bill, MD;  Location: [facility] ENDOSCOPY;  Service: Endoscopy;  Laterality: N/A;  DM   COLONOSCOPY WITH PROPOFOL N/A 02/08/2022    Procedure: COLONOSCOPY WITH PROPOFOL;  Surgeon: Regis Bill, MD;  Location: [facility] ENDOSCOPY;  Service: Endoscopy;  Laterality: N/A;   DIAGNOSTIC LAPAROSCOPY     ESOPHAGOGASTRODUODENOSCOPY (EGD) WITH PROPOFOL N/A 08/08/2017   Procedure: ESOPHAGOGASTRODUODENOSCOPY (EGD) WITH PROPOFOL;  Surgeon: Scot Jun, MD;  Location: [facility] ENDOSCOPY;  Service: Endoscopy;  Laterality: N/A;   KNEE SURGERY     [facility]   LAPAROSCOPIC HYSTERECTOMY Bilateral 01/22/2017   Procedure: HYSTERECTOMY TOTAL LAPAROSCOPIC BSO;  Surgeon: Vena Austria, MD;  Location: [facility] ORS;  Service: Gynecology;  Laterality: Bilateral;    Home Medications:  Allergies as of 08/26/2023       Reactions   Penicillin G Nausea Only   Has patient had a PCN reaction causing immediate rash, facial/tongue/throat swelling, SOB or lightheadedness with hypotension: No Has patient had a PCN reaction causing severe rash involving mucus membranes or skin necrosis: No Has patient had a PCN reaction that required hospitalization: No Has patient had a PCN reaction occurring within the last 10 years: No If all of the above answers are "NO", then may proceed with Cephalosporin use.        Medication List        Accurate as of August 26, 2023  9:31 AM. If you have any questions, ask your nurse or doctor.          albuterol 108 (90 Base) MCG/ACT inhaler Commonly known as: VENTOLIN HFA Inhale  1-2 puffs into the lungs every 6 (six) hours as needed.   Anoro Ellipta 62.5-25 MCG/ACT Aepb Generic drug: umeclidinium-vilanterol Inhale 1 puff into the lungs daily.   atorvastatin 20 MG tablet Commonly known as: LIPITOR Take 20 mg by mouth daily.   busPIRone 7.5 MG tablet Commonly known as: BUSPAR Take 7.5 mg by mouth 2 (two) times daily.   carvedilol 3.125 MG tablet Commonly known as: COREG Take 1 tablet (3.125 mg total) by mouth 2 (two) times daily with a meal.   cetirizine 10 MG tablet Commonly known as: ZYRTEC Take 20 mg by  mouth daily.   dapagliflozin propanediol 10 MG Tabs tablet Commonly known as: Farxiga Take 1 tablet (10 mg total) by mouth daily before breakfast.   etodolac 400 MG tablet Commonly known as: LODINE Take 400 mg by mouth 2 (two) times daily.   fluticasone 50 MCG/ACT nasal spray Commonly known as: FLONASE Place 2 sprays into both nostrils daily.   ibuprofen 600 MG tablet Commonly known as: ADVIL Take 1 tablet (600 mg total) by mouth every 6 (six) hours as needed for headache, moderate pain or cramping.   metFORMIN 500 MG tablet Commonly known as: Glucophage Take 1 tablet (500 mg total) by mouth 2 (two) times daily with a meal. What changed: how much to take   naproxen 500 MG tablet Commonly known as: NAPROSYN Take 500 mg by mouth 2 (two) times daily.   omeprazole 40 MG capsule Commonly known as: PRILOSEC TAKE 1 CAPSULE BY MOUTH ONCE DAILY 30 MINUTES BEFORE BREAKFAST   PARoxetine 10 MG tablet Commonly known as: PAXIL TAKE 1 & 1 2 (ONE & ONE HALF) TABLETS BY MOUTH ONCE DAILY   traZODone 50 MG tablet Commonly known as: DESYREL Take 50 mg by mouth at bedtime.   Vitamin D (Ergocalciferol) 1.25 MG (50000 UNIT) Caps capsule Commonly known as: DRISDOL Take 1 capsule (50,000 Units total) by mouth every 7 (seven) days.        Allergies:  Allergies  Allergen Reactions   Penicillin G Nausea Only    Has patient had a PCN reaction causing immediate rash, facial/tongue/throat swelling, SOB or lightheadedness with hypotension: No Has patient had a PCN reaction causing severe rash involving mucus membranes or skin necrosis: No Has patient had a PCN reaction that required hospitalization: No Has patient had a PCN reaction occurring within the last 10 years: No If all of the above answers are "NO", then may proceed with Cephalosporin use.     Family History: Family History  Problem Relation Age of Onset   Diabetes Mother    Emphysema Mother    Hypertension Father     Diabetes Father    Diabetes Sister    Breast cancer Neg Hx     Social History:   reports that she has been smoking cigarettes. She has a 60 pack-year smoking history. She has never used smokeless tobacco. She reports that she does not drink alcohol and does not use drugs.  Physical Exam: BP 139/81   Pulse 86   Ht 5\' 5"  (1.651 m)   Wt 215 lb (97.5 kg)   BMI 35.78 kg/m   Constitutional:  Alert and oriented, no acute distress, nontoxic appearing HEENT: [place], AT Cardiovascular: No clubbing, cyanosis, or edema Respiratory: Normal respiratory effort, no increased work of breathing Skin: No rashes, bruises or suspicious lesions Neurologic: Grossly intact, no focal deficits, moving all 4 extremities Psychiatric: Normal mood and affect  Pertinent Imaging: KUB, 08/26/2023: See epic  I personally reviewed the images referenced above and note several small left renal stones and a significant stool burden.  No radiopaque ureteral stones.  Assessment & Plan:   1. Nephrolithiasis (Primary) Asymptomatic.  Small left renal stone seen on KUB today.  Right renal shadow largely obscured by bowel contents.  Will continue to monitor.  2. Microhematuria Possible gross hematuria associated with UTI, otherwise denying gross hematuria.  We discussed the need for repeat hematuria workups every 2 to 5 years or sooner if she develops significant change in her symptoms.  I encouraged her to follow-up with me if she develops gross hematuria again.  3. Constipation, unspecified constipation type I think a lot of her abdominal discomfort is due to the stool burden seen on KUB today.  We discussed starting a bowel regimen of MiraLAX. - polyethylene glycol powder (MIRALAX) 17 GM/SCOOP powder; Take 17 g by mouth daily.  Dispense: 510 g; Refill: 2   Return in about 1 year (around 08/25/2024) for Annual stone visit with KUB prior, UA.  Carman Ching, PA-C  [facility] [address] [place], [place] [address] [phone]

## 2023-08-26 NOTE — Patient Instructions (Signed)
 Start Miralax powder to help with your constipation. We are aiming for formed bowel movements that are easy to pass; how often you go is less important.  Start taking 1 capful of Miralax powder, mixed into 4-8oz of a drink of choice. Do this once daily. It may be a couple of days before you have a bowel movement, but keep going!  You might find that you have a lot of bowel movements or loose stools around days 5-7 on Miralax. Keep taking the Miralax; things will settle out after a few days.  If after about 10 days on Miralax... ...your stools are formed and easy to pass, then continue 1 capful of powder daily. ...your stools are still hard and difficult to pass, try increasing your dose to 2 capfuls of powder daily. ...your stools remain soft and you are going very frequently, try decreasing your dose to 1/2 capful of powder daily.  Make sure your gastroenterologist knows you are on this regimen.

## 2023-08-28 ENCOUNTER — Ambulatory Visit: Payer: Self-pay | Admitting: Physician Assistant

## 2023-10-09 ENCOUNTER — Encounter: Payer: Self-pay | Admitting: Acute Care

## 2023-10-14 ENCOUNTER — Telehealth: Payer: Self-pay

## 2023-10-14 ENCOUNTER — Telehealth: Payer: Self-pay | Admitting: Family Medicine

## 2023-10-14 NOTE — Telephone Encounter (Signed)
 Left message to return call to our office.  Okay to really message please document when spoke to.

## 2023-10-14 NOTE — Telephone Encounter (Signed)
 Copied from CRM [id number]. Topic: General - Other >> Oct 14, 2023 12:38 PM Chuck Crater wrote: Reason for CRM: Patient wants to know when she is going to get a call to schedule for ultrasound.

## 2023-10-14 NOTE — Telephone Encounter (Signed)
-----   Message from Valli Gaw sent at 10/13/2023  5:49 PM EDT ----- Liver function remains elevated. Recommend liver ultrasound.  A1c improved.  Continue current medication.

## 2023-10-15 NOTE — Telephone Encounter (Signed)
 Patient is wondering when someone will call her to schedule the liver ultrasound.

## 2023-10-16 ENCOUNTER — Telehealth: Payer: Self-pay

## 2023-10-16 NOTE — Telephone Encounter (Signed)
 Copied from CRM [id number]. Topic: Appointments - Scheduling Inquiry for Clinic >> Oct 16, 2023 10:31 AM Martinique E wrote: Reason for CRM: Patient called in stating she missed a call from the clinic, relayed message from chart to patient and she wants to move forward with scheduling an ultrasound of her liver. Callback number for patient is [phone].

## 2023-10-16 NOTE — Telephone Encounter (Signed)
-----   Message from Valli Gaw sent at 10/13/2023  5:49 PM EDT ----- Liver function remains elevated. Recommend liver ultrasound.  A1c improved.  Continue current medication.

## 2023-10-16 NOTE — Telephone Encounter (Signed)
 Noted will send request to provider.

## 2023-10-16 NOTE — Telephone Encounter (Signed)
 Left message to return call to our office.  Okay to relay message to pt.

## 2023-10-19 ENCOUNTER — Other Ambulatory Visit: Payer: Self-pay | Admitting: Family Medicine

## 2023-10-19 DIAGNOSIS — R7989 Other specified abnormal findings of blood chemistry: Secondary | ICD-10-CM

## 2023-10-20 NOTE — Telephone Encounter (Signed)
 Pt has an appointment on 10/24/2023

## 2023-10-20 NOTE — Telephone Encounter (Signed)
 Called Patient to give her the phone number to call & schedule her ultrasound and she states she just got off of the phone with them. Patient is scheduled for Friday.

## 2023-10-24 ENCOUNTER — Ambulatory Visit
Admission: RE | Admit: 2023-10-24 | Discharge: 2023-10-24 | Disposition: A | Discharge status: Home / Self Care | Source: Ambulatory Visit | Attending: Family Medicine | Admitting: Family Medicine

## 2023-10-24 DIAGNOSIS — R7989 Other specified abnormal findings of blood chemistry: Secondary | ICD-10-CM | POA: Diagnosis present

## 2023-10-26 ENCOUNTER — Ambulatory Visit: Payer: Self-pay | Admitting: Family Medicine

## 2023-10-27 ENCOUNTER — Telehealth: Payer: Self-pay | Admitting: Family Medicine

## 2023-10-27 NOTE — Telephone Encounter (Unsigned)
 Copied from CRM [id number]. Topic: Clinical - Lab/Test Results >> Oct 27, 2023 10:28 AM Clyde Darling P wrote: Reason for CRM: Pt called back relayed test results - pt advise she is unable to follow up with GI in [place] because her insurance does not cover up, she would like referral with someone that is acceptable with her insurance. Pt can be reached [phone]

## 2023-10-28 ENCOUNTER — Ambulatory Visit: Payer: Medicaid Other | Admitting: Family Medicine

## 2023-10-28 ENCOUNTER — Ambulatory Visit (INDEPENDENT_AMBULATORY_CARE_PROVIDER_SITE_OTHER)

## 2023-10-28 VITALS — BP 126/80 | HR 79 | Temp 98.0°F | Ht 65.0 in | Wt 213.6 lb

## 2023-10-28 DIAGNOSIS — G4700 Insomnia, unspecified: Secondary | ICD-10-CM | POA: Insufficient documentation

## 2023-10-28 DIAGNOSIS — L219 Seborrheic dermatitis, unspecified: Secondary | ICD-10-CM | POA: Insufficient documentation

## 2023-10-28 DIAGNOSIS — J432 Centrilobular emphysema: Secondary | ICD-10-CM

## 2023-10-28 DIAGNOSIS — E118 Type 2 diabetes mellitus with unspecified complications: Secondary | ICD-10-CM | POA: Diagnosis not present

## 2023-10-28 DIAGNOSIS — E1129 Type 2 diabetes mellitus with other diabetic kidney complication: Secondary | ICD-10-CM

## 2023-10-28 DIAGNOSIS — Z1231 Encounter for screening mammogram for malignant neoplasm of breast: Secondary | ICD-10-CM

## 2023-10-28 DIAGNOSIS — J449 Chronic obstructive pulmonary disease, unspecified: Secondary | ICD-10-CM

## 2023-10-28 DIAGNOSIS — E559 Vitamin D deficiency, unspecified: Secondary | ICD-10-CM

## 2023-10-28 DIAGNOSIS — E1169 Type 2 diabetes mellitus with other specified complication: Secondary | ICD-10-CM

## 2023-10-28 DIAGNOSIS — K8020 Calculus of gallbladder without cholecystitis without obstruction: Secondary | ICD-10-CM

## 2023-10-28 DIAGNOSIS — F39 Unspecified mood [affective] disorder: Secondary | ICD-10-CM

## 2023-10-28 DIAGNOSIS — R809 Proteinuria, unspecified: Secondary | ICD-10-CM

## 2023-10-28 DIAGNOSIS — R7989 Other specified abnormal findings of blood chemistry: Secondary | ICD-10-CM

## 2023-10-28 DIAGNOSIS — R918 Other nonspecific abnormal finding of lung field: Secondary | ICD-10-CM | POA: Insufficient documentation

## 2023-10-28 DIAGNOSIS — E785 Hyperlipidemia, unspecified: Secondary | ICD-10-CM

## 2023-10-28 HISTORY — DX: Calculus of gallbladder without cholecystitis without obstruction: K80.20

## 2023-10-28 MED ORDER — TRAZODONE HCL 50 MG PO TABS: 50.0000 mg | ORAL_TABLET | Freq: Every day | ORAL | 1 refills | Status: DC

## 2023-10-28 MED ORDER — METFORMIN HCL 500 MG PO TABS: 1000.0000 mg | ORAL_TABLET | Freq: Two times a day (BID) | ORAL | 1 refills | Status: DC

## 2023-10-28 MED ORDER — KETOCONAZOLE 2 % EX SHAM: 1.0000 | MEDICATED_SHAMPOO | CUTANEOUS | 0 refills | Status: DC

## 2023-10-28 NOTE — Assessment & Plan Note (Signed)
 Not on statin due to elevated lts, fatty liver disease, chololithiasis on recent RUQ ultrasound. Recommend general surgery referral for evaluation for elective surgery.  Will benefit from weight loss, consider starting on GLP-1 group medication in future. Consider restarting statin during next visit.

## 2023-10-28 NOTE — Assessment & Plan Note (Signed)
 Recommend updating low dose CT lungs. Patient requested pulmonology referral which was made today. Plans on following with pulmonology.

## 2023-10-28 NOTE — Assessment & Plan Note (Signed)
 Stable on Trazodone 50 mg without s/e. Refill for 2 months sent today. Recommend f/u in 6-8 weeks.

## 2023-10-28 NOTE — Assessment & Plan Note (Signed)
 Referral to general surgery for potential elective cholecystectomy

## 2023-10-28 NOTE — Assessment & Plan Note (Signed)
 Plan per centrilobular emplysema

## 2023-10-28 NOTE — Assessment & Plan Note (Signed)
 On Metformin  500 mg BID.  + microalbuminuria on 07/01/23. Not taking Farxiga  as recommended by Dr. Sueanne Emerald due to insurance concerns. Will reach out to prior auth team for prior auth on Farxiga  10 mg daily. If not able to get this will start her Acei/ARBs. Reviewed A1c from 08/12/23.

## 2023-10-28 NOTE — Assessment & Plan Note (Signed)
 Taking weekly vitamin D  50,000 units as recommended.

## 2023-10-28 NOTE — Assessment & Plan Note (Signed)
 Due for mammogram, patient is not sure where she would like the mammogram order sent to. She will reach out to us  with preferred location to get screening mammogram.

## 2023-10-28 NOTE — Assessment & Plan Note (Signed)
 Involving face, scalp. Recommend Ketoconazole shampoo 2%, twice a week for 4 weeks then prn.

## 2023-10-28 NOTE — Assessment & Plan Note (Signed)
-   Brief dietary, increasing low intensity exercise counseling, and behavioral strategies to help aid in weight loss discussed. F/U in 6-8 weeks.

## 2023-10-28 NOTE — Assessment & Plan Note (Signed)
 Likely MAFLD related.  Weight loss discussed.  Will consider starting her on GLP-1 during next visit.

## 2023-10-28 NOTE — Patient Instructions (Signed)
--   I am sending you refill on Trazadone for 2 months. I would like to see you in next 6-8 weeks.  -- I am sending a referral to a general surgeon to see if you are a candidate for gall bladder removal for gall stones.  -- I am increasing the dose of your Metformin  from 500 mg twice a day to 1,000 mg twice a day. You can take 2 tablets, twice a day.  -- I am also sending a referral to a pulmonologist within [place] for COPD.  -- You are due for diabetic eye exam. Please schedule eye exam with [facility] to schedule an appointment. -- You also qualify for shingles vaccine. I recommend you get it through your pharmacy.

## 2023-10-28 NOTE — Progress Notes (Addendum)
 Established patient of Dr. Sueanne Emerald presenting for acute visit, requesting medication refill.    Subjective  Patient ID: Kristen Wong, female    DOB: 04-May-1966  Age: 58 y.o. MRN: [id number]  Chief Complaint  Patient presents with   Diabetes   Hypertension   Kristen Wong  has a past medical history of Arthritis, Atypical chest pain (07/28/2023), Blood dyscrasia, COPD (chronic obstructive pulmonary disease) (HCC), Depression, Diabetes mellitus without complication (HCC) (2018), Fatty liver, Fatty liver, GERD (gastroesophageal reflux disease), Heart murmur, Hypertension, Irritable bowel syndrome, Polycythemia, and Vitamin B 12 deficiency (07/13/2023).  HPI Patient presents today for follow up of multiple medical problems. 1) COPD/emphysema:  2) Multiple pulmonary nodules:Last CT chest 11/23/2021, B/L - Used to see [facility] pulmonologist Dr. Jamal Mays. Due to change in insurance Kristen Wong is no longer able to see him.Last spirometry on 11/20/22 at [facility] H/O smoking since Kristen Wong was about 58 years of age, continues to smoke 2 packs of cigarettes per day.  - Has Anoro Ellipta and albuterol  inhaler prescribed to Kristen Wong but only using Albuterol  inhaler due to cost related to [employer] Kristen Wong uses Albuterol  inhaler as needed for shortness of breath. In 1 week Kristen Wong uses albuterol  inhaler about 2-3 times a week.  - Kristen Wong is looking for a new pulmonologist.  - Kristen Wong is not ready to quit smoking.  - Has b/l pulmonary nodules, RADS-2, annual CT lung screening was recommended.   3) Hyperlipidemia:  - Stopped Lipitor 20 mg on 06/16/2023 due to elevated liver enzyme.  - Kristen Wong is not exercising regularly.  - Diet: mixed home made and fast food.  4) Diabetes Type 2 Lab Results  Component Value Date   HGBA1C 7.5 (H) 08/12/2023   HGBA1C 8.4 04/16/2023    Lab Results  Component Value Date   MICROALBUR 23.5 (H) 07/01/2023   CREATININE 0.48 08/12/2023   Last diabetic eye exam was:  Reports it has been a while. Currently  on Metformin  00 mg, twice a day.  Kristen Wong is also prescribed Farxiga  10 mg but is not able to take this due to cost.   5)Hypertension Patient is currently maintained on the following medications for blood pressure: Carvedilol  3.125 mg BID.  Patient reports good compliance with blood pressure medications. Patient denies chest pain, shortness of breath or swelling. Last 3 blood pressure readings in our office are as follows:  BP Readings from Last 3 Encounters:  10/28/23 126/80  08/26/23 139/81  07/28/23 126/70   6) Elevated LFTS, fatty liver disease with RUQ ultrasound from 10/24/23 showing Cholelithiasis without sonographic evidence of acute cholecystitis.  - Denies abdominal pain currently.  - Is interested in seeing general surgeon for potential elective cholecystectomy.   7) GERD: On 40 mg Omeprazole  8) Mood disorder:  On Buspar 7.5 mg, twice a day. Kristen Wong is also taking Paroxetine 10 mg daily. Mood has been stable, no thoughts of SI/HI.   9) Insomnia: On Trazodone 50 mg once a day, has been taking for about 3-4 years. Taking this every night without s/e. Requesting refill.   10) Vitamin D  deficiency: Has been taking once a week vitamin D  50,000 units.   ROS As per HPI    Objective:      BP 126/80   Pulse 79   Temp 98 F (36.7 C) (Oral)   Ht 5\' 5"  (1.651 m)   Wt 213 lb 9.6 oz (96.9 kg)   SpO2 96%   BMI 35.54 kg/m      10/28/2023  9:13 AM 07/28/2023    3:40 PM 07/01/2023   11:23 AM  Depression screen PHQ 2/9  Decreased Interest 3 0 3  Down, Depressed, Hopeless 3 2 3   PHQ - 2 Score 6 2 6   Altered sleeping 3 3 2   Tired, decreased energy 3 3 3   Change in appetite 2 3 3   Feeling bad or failure about yourself  2 2 2   Trouble concentrating 0 0 0  Moving slowly or fidgety/restless 3 0 0  Suicidal thoughts 0 0 1  PHQ-9 Score 19 13 17   Difficult doing work/chores  Not difficult at all Very difficult      10/28/2023    9:13 AM 07/28/2023    3:40 PM 07/01/2023   11:23  AM  GAD 7 : Generalized Anxiety Score  Nervous, Anxious, on Edge 1 2 1   Control/stop worrying 3 3 2   Worry too much - different things 2 3 2   Trouble relaxing 3 1 1   Restless 1 0 1  Easily annoyed or irritable 1 0 1  Afraid - awful might happen 3 2 1   Total GAD 7 Score 14 11 9   Anxiety Difficulty Somewhat difficult Somewhat difficult Somewhat difficult      10/28/2023    9:13 AM 07/28/2023    3:40 PM 07/01/2023   11:23 AM  Depression screen PHQ 2/9  Decreased Interest 3 0 3  Down, Depressed, Hopeless 3 2 3   PHQ - 2 Score 6 2 6   Altered sleeping 3 3 2   Tired, decreased energy 3 3 3   Change in appetite 2 3 3   Feeling bad or failure about yourself  2 2 2   Trouble concentrating 0 0 0  Moving slowly or fidgety/restless 3 0 0  Suicidal thoughts 0 0 1  PHQ-9 Score 19 13 17   Difficult doing work/chores  Not difficult at all Very difficult      10/28/2023    9:13 AM 07/28/2023    3:40 PM 07/01/2023   11:23 AM  GAD 7 : Generalized Anxiety Score  Nervous, Anxious, on Edge 1 2 1   Control/stop worrying 3 3 2   Worry too much - different things 2 3 2   Trouble relaxing 3 1 1   Restless 1 0 1  Easily annoyed or irritable 1 0 1  Afraid - awful might happen 3 2 1   Total GAD 7 Score 14 11 9   Anxiety Difficulty Somewhat difficult Somewhat difficult Somewhat difficult   SDOH Screenings   Food Insecurity: No Food Insecurity (04/16/2023)   Received from [facility]  Housing: Unknown (04/16/2023)   Received from [facility]  Transportation Needs: No Transportation Needs (04/16/2023)   Received from [facility]  Utilities: Not At Risk (04/16/2023)   Received from [facility]  Depression [phone]): High Risk (10/28/2023)  Financial Resource Strain: Low Risk  (04/16/2023)   Received from [facility]  Physical Activity: Inactive (09/17/2018)   Received from [facility], [facility]  Social Connections: Moderately Isolated (09/17/2018)   Received from [facility], [facility]  Stress: Stress Concern Present (09/17/2018)   Received from [facility], [facility]  Tobacco Use: High Risk (10/28/2023)     Physical Exam Constitutional:      Appearance: Kristen Wong is obese.  HENT:     Head: Normocephalic.     Mouth/Throat:     Comments: Poor dental hygiene  with halitosis  Cardiovascular:     Rate and Rhythm: Normal rate.  Pulmonary:     Breath sounds: Normal breath sounds.  Abdominal:     General: Abdomen is protuberant.     Palpations: Abdomen is soft.     Tenderness: There is no guarding. Negative signs include Murphy's sign.  Musculoskeletal:     Cervical back: No rigidity.     Right lower leg: No edema.     Left lower leg: No edema.  Skin:    Comments: Seborrheic dermatitis of scalp, face  Neurological:     Mental Status: Kristen Wong is oriented to person, place, and time.  Psychiatric:        Mood and Affect: Mood normal.        No results found for any visits on 10/28/23.  The ASCVD Risk score (Arnett DK, et al., 2019) failed to calculate for the following reasons:   Cannot find a previous HDL lab   Cannot find a previous total cholesterol lab    Assessment & Plan:  Calculus of gallbladder without cholecystitis without obstruction Assessment & Plan: Referral to general surgery for potential elective cholecystectomy   Orders: -     Ambulatory referral to General Surgery -     metFORMIN  HCl; Take 2 tablets (1,000 mg total) by mouth 2 (two) times daily with a meal.  Dispense: 60 tablet; Refill: 1  Chronic obstructive pulmonary disease, unspecified COPD type (HCC) Assessment & Plan: Plan per centrilobular emplysema  Orders: -     Pulmonary Visit  Type 2 diabetes mellitus with complications (HCC) Assessment & Plan: On Metformin  500 mg BID.  + microalbuminuria on 07/01/23. Not  taking Farxiga  as recommended by Dr. Sueanne Emerald due to insurance concerns. Will reach out to prior auth team for prior auth on Farxiga  10 mg daily. If not able to get this will start Kristen Wong Acei/ARBs. Reviewed A1c from 08/12/23.    Morbid obesity (HCC) Assessment & Plan: - Brief dietary, increasing low intensity exercise counseling, and behavioral strategies to help aid in weight loss discussed. F/U in 6-8 weeks.   Insomnia, unspecified type Assessment & Plan: Stable on Trazodone 50 mg without s/e. Refill for 2 months sent today. Recommend f/u in 6-8 weeks.  Orders: -     traZODone HCl; Take 1 tablet (50 mg total) by mouth at bedtime.  Dispense: 30 tablet; Refill: 1  Seborrheic dermatitis Assessment & Plan: Involving face, scalp. Recommend Ketoconazole shampoo 2%, twice a week for 4 weeks then prn.  Orders: -     Ketoconazole; Apply 1 Application topically 2 (two) times a week.  Dispense: 120 mL; Refill: 0  Multiple pulmonary nodules determined by computed tomography of lung Assessment & Plan: Recommend updating low dose CT lungs. Patient requested pulmonology referral which was made today. Plans on following with pulmonology.   Vitamin D  deficiency Assessment & Plan: Taking weekly vitamin D  50,000 units as recommended.   Elevated LFTs Assessment & Plan: Likely MAFLD related.  Weight loss discussed.  Will consider starting Kristen Wong on GLP-1 during next visit.   Hyperlipidemia associated with type 2 diabetes mellitus (HCC) Assessment & Plan: Not on statin due to elevated lts, fatty liver disease, chololithiasis on recent RUQ ultrasound. Recommend general surgery referral for evaluation for elective surgery.  Will benefit from weight loss, consider starting on GLP-1 group medication in future. Consider restarting statin during next visit.   Mood disorder (HCC)  Centrilobular emphysema (HCC) Assessment & Plan: - Continues to smoke, not  ready to quit smoking. Only on Albuterol  inhaler  due to cost. Requesting pulmonology referral which was made today.    Breast cancer screening by mammogram Assessment & Plan: Due for mammogram, patient is not sure where Kristen Wong would like the mammogram order sent to. Kristen Wong will reach out to us  with preferred location to get screening mammogram.    Diabetes mellitus with microalbuminuria (HCC) Assessment & Plan: On Metformin  500 mg BID.  + microalbuminuria on 07/01/23. Not taking Farxiga  as recommended by Dr. Sueanne Emerald due to insurance concerns. Will reach out to prior auth team for prior auth on Farxiga  10 mg daily. If not able to get this will start Kristen Wong Acei/ARBs. Reviewed A1c from 08/12/23.      Return in about 6 weeks (around 12/09/2023) for Chronic follow up. Consider starting patient on GLP-1. F/U on diabetic eye exam, screening mammogram, low dose lung cancer screening, restarting statin. Kristen Wong plans on updating shingles immunization though Kristen Wong pharmacy, follow up on if patient got shingles vaccine. Recommend dental evaluation twice a year.   Jacklin Mascot, MD

## 2023-10-28 NOTE — Assessment & Plan Note (Addendum)
>>  ASSESSMENT AND PLAN FOR CENTRILOBULAR EMPHYSEMA (HCC) WRITTEN ON 10/28/2023 10:32 AM BY ABBEY BRUCKNER, MD  - Continues to smoke, not ready to quit smoking. Only on Albuterol  inhaler due to cost. Requesting pulmonology referral which was made today.    >>ASSESSMENT AND PLAN FOR CHRONIC OBSTRUCTIVE PULMONARY DISEASE (HCC) WRITTEN ON 10/28/2023 10:37 AM BY ABBEY BRUCKNER, MD  Plan per centrilobular emplysema

## 2023-11-24 ENCOUNTER — Other Ambulatory Visit: Payer: Self-pay

## 2023-11-24 ENCOUNTER — Other Ambulatory Visit: Payer: Self-pay | Admitting: Family Medicine

## 2023-11-24 ENCOUNTER — Other Ambulatory Visit: Payer: Self-pay | Admitting: Physician Assistant

## 2023-11-24 DIAGNOSIS — L219 Seborrheic dermatitis, unspecified: Secondary | ICD-10-CM

## 2023-11-24 DIAGNOSIS — K5900 Constipation, unspecified: Secondary | ICD-10-CM

## 2023-11-24 DIAGNOSIS — E1159 Type 2 diabetes mellitus with other circulatory complications: Secondary | ICD-10-CM

## 2023-11-25 ENCOUNTER — Encounter: Payer: Self-pay | Admitting: Internal Medicine

## 2023-11-25 ENCOUNTER — Ambulatory Visit (INDEPENDENT_AMBULATORY_CARE_PROVIDER_SITE_OTHER): Admitting: Internal Medicine

## 2023-11-25 VITALS — BP 130/80 | HR 92 | Temp 98.8°F | Ht 64.0 in | Wt 212.4 lb

## 2023-11-25 DIAGNOSIS — E1100 Type 2 diabetes mellitus with hyperosmolarity without nonketotic hyperglycemic-hyperosmolar coma (NKHHC): Secondary | ICD-10-CM

## 2023-11-25 DIAGNOSIS — J449 Chronic obstructive pulmonary disease, unspecified: Secondary | ICD-10-CM

## 2023-11-25 DIAGNOSIS — F17210 Nicotine dependence, cigarettes, uncomplicated: Secondary | ICD-10-CM

## 2023-11-25 DIAGNOSIS — F17200 Nicotine dependence, unspecified, uncomplicated: Secondary | ICD-10-CM

## 2023-11-25 NOTE — Patient Instructions (Addendum)
 Please stop smoking!  If you have any type of surgery,  you are at a MODERATE  risk for lung problems  Start Advair HFA 2 puffs in the morning 2 puffs at night Please rinse mouth after use  We referral to the lung cancer screening program  Obtain pulmonary function test to assess lung function  Obtain overnight oxygen test to assess for oxygen at night  Referral to cardiology to assess for underlying CAD  Avoid Allergens and Irritants Avoid secondhand smoke Avoid SICK contacts Recommend  Masking  when appropriate Recommend Keep up-to-date with vaccinations

## 2023-11-25 NOTE — Progress Notes (Signed)
 [facility] Pulmonary Medicine Consultation      Date: 11/25/2023,   MRN# [id number] Kristen Wong [birth year]  TESTS Low-dose CT chest June 2023 -Mild emphysema, subcentimeter nodule left lower lobe  Ambulating pulse oximetry June 2025-no evidence of hypoxemia 92 to 94%    CHIEF COMPLAINT:   Assessment of COPD    HISTORY OF PRESENT ILLNESS   58 year old pleasant white female seen today for assessment COPD Patient has been diagnosed with COPD many years ago Patient only takes Ventolin  inhaler as needed 3-5 times per week Patient has ongoing symptoms of shortness of breath intermittent wheezing nonproductive cough  Patient with extensive smoking history 2 packs a day Smoking cessation strongly advised Patient with a history of COVID infection several years ago Patient with a history of pneumonia but no previous hospital admissions   No exacerbation at this time No evidence of heart failure at this time No evidence or signs of infection at this time No respiratory distress No fevers, chills, nausea, vomiting, diarrhea No evidence of lower extremity edema No evidence hemoptysis  Ambulating pulse oximetry in the office today did not reveal significant hypoxia  Obtain overnight pulse oximetry Patient was started on Breztri  inhaler however could not afford We will start Advair HFA  Patient had been enrolled in the lung cancer screening program will need to reestablish program  Patient with history of diabetes and extensive smoking history high risk factors for coronary artery disease Recommend cardiology referral  Patient is to undergo gallbladder surgery assessment I explained to patient she is at a moderate risk for lung problems and infections    Chest x-ray independently reviewed by me today January 2025 No significant findings no pneumonia no edema Consider mild interstitial lung disease bronchiectasis        LDCT scan 11/2021  Independently  Reviewed By Me Today  No pleural effusion. Mild centrilobular emphysema with diffuse bronchial wall thickening. No acute consolidative airspace disease or lung masses. Numerous scattered solid bilateral pulmonary nodules, largest 5.4 mm in volume derived mean diameter in the posterior left lower lobe not appreciably changed from 12/04/2018 CT.      PAST MEDICAL HISTORY   Past Medical History:  Diagnosis Date   Arthritis    KNEES   Atypical chest pain 07/28/2023   Blood dyscrasia    POLYCYTHEMIA   COPD (chronic obstructive pulmonary disease) (HCC)    Depression    H/O   Diabetes mellitus without complication (HCC) 2018   Type 2-NEWLY DX   Fatty liver    Fatty liver    GERD (gastroesophageal reflux disease)    OCC   Heart murmur    ASYMPTOMATIC   Hypertension    Irritable bowel syndrome    Polycythemia    Vitamin B 12 deficiency 07/13/2023     SURGICAL HISTORY   Past Surgical History:  Procedure Laterality Date   ABDOMINAL HYSTERECTOMY     CESAREAN SECTION     COLONOSCOPY WITH PROPOFOL  N/A 08/08/2017   Procedure: COLONOSCOPY WITH PROPOFOL ;  Surgeon: Cassie Click, MD;  Location: [facility] ENDOSCOPY;  Service: Endoscopy;  Laterality: N/A;   COLONOSCOPY WITH PROPOFOL  N/A 01/12/2021   Procedure: COLONOSCOPY WITH PROPOFOL ;  Surgeon: Shane Darling, MD;  Location: [facility] ENDOSCOPY;  Service: Endoscopy;  Laterality: N/A;  DM   COLONOSCOPY WITH PROPOFOL  N/A 05/11/2021   Procedure: COLONOSCOPY WITH PROPOFOL ;  Surgeon: Shane Darling, MD;  Location: [facility] ENDOSCOPY;  Service: Endoscopy;  Laterality: N/A;  DM   COLONOSCOPY WITH  PROPOFOL  N/A 02/08/2022   Procedure: COLONOSCOPY WITH PROPOFOL ;  Surgeon: Shane Darling, MD;  Location: [facility] ENDOSCOPY;  Service: Endoscopy;  Laterality: N/A;   DIAGNOSTIC LAPAROSCOPY     ESOPHAGOGASTRODUODENOSCOPY (EGD) WITH PROPOFOL  N/A 08/08/2017   Procedure: ESOPHAGOGASTRODUODENOSCOPY (EGD) WITH PROPOFOL ;  Surgeon: Cassie Click, MD;   Location: [facility] ENDOSCOPY;  Service: Endoscopy;  Laterality: N/A;   KNEE SURGERY     [facility]   LAPAROSCOPIC HYSTERECTOMY Bilateral 01/22/2017   Procedure: HYSTERECTOMY TOTAL LAPAROSCOPIC BSO;  Surgeon: Darl Edu, MD;  Location: [facility] ORS;  Service: Gynecology;  Laterality: Bilateral;     FAMILY HISTORY   Family History  Problem Relation Age of Onset   Diabetes Mother    Emphysema Mother    Hypertension Father    Diabetes Father    Diabetes Sister    Breast cancer Neg Hx      SOCIAL HISTORY   Social History   Tobacco Use   Smoking status: Every Day    Current packs/day: 2.00    Average packs/day: 2.0 packs/day for 30.0 years (60.0 ttl pk-yrs)    Types: Cigarettes   Smokeless tobacco: Never  Vaping Use   Vaping status: Never Used  Substance Use Topics   Alcohol use: No   Drug use: No     MEDICATIONS    Home Medication:  Current Outpatient Rx   Order #: [id number] Class: Normal   Order #: [id number] Class: Historical Med   Order #: [id number] Class: Historical Med   Order #: [id number] Class: Normal   Order #: [id number] Class: Historical Med   Order #: [id number] Class: Normal   Order #: [id number] Class: Historical Med   Order #: [id number] Class: Historical Med   Order #: [id number] Class: Print   Order #: [id number] Class: Normal   Order #: [id number] Class: Normal   Order #: [id number] Class: Historical Med   Order #: [id number] Class: Historical Med   Order #: [id number] Class: Historical Med   Order #: [id number] Class: Normal   Order #: [id number] Class: Normal   Order #: [id number] Class: Normal    Current Medication:  Current Outpatient Medications:    albuterol  (VENTOLIN  HFA) 108 (90 Base) MCG/ACT inhaler, Inhale 1-2 puffs into the lungs every 6 (six) hours as needed., Disp: 8.7 g, Rfl: 3   atorvastatin (LIPITOR) 20 MG tablet, Take 20 mg by mouth daily., Disp: , Rfl:    busPIRone (BUSPAR) 7.5 MG tablet, Take 7.5 mg by mouth 2 (two) times daily., Disp: , Rfl:    carvedilol   (COREG ) 3.125 MG tablet, TAKE 1 TABLET BY MOUTH TWICE A DAY WITH A MEAL, Disp: 60 tablet, Rfl: 11   cetirizine (ZYRTEC) 10 MG tablet, Take 20 mg by mouth daily., Disp: , Rfl:    dapagliflozin  propanediol (FARXIGA ) 10 MG TABS tablet, Take 1 tablet (10 mg total) by mouth daily before breakfast., Disp: 30 tablet, Rfl: 11   etodolac (LODINE) 400 MG tablet, Take 400 mg by mouth 2 (two) times daily., Disp: , Rfl:    fluticasone (FLONASE) 50 MCG/ACT nasal spray, Place 2 sprays into both nostrils daily., Disp: , Rfl:    ibuprofen  (ADVIL ,MOTRIN ) 600 MG tablet, Take 1 tablet (600 mg total) by mouth every 6 (six) hours as needed for headache, moderate pain or cramping., Disp: 60 tablet, Rfl: 1   ketoconazole  (NIZORAL ) 2 % shampoo, Apply 1 Application topically 2 (two) times a week., Disp: 120 mL, Rfl: 0   metFORMIN  (GLUCOPHAGE ) 500 MG tablet, Take 2 tablets (1,000 mg total) by mouth 2 (two) times daily with a meal., Disp: 60  tablet, Rfl: 1   naproxen (NAPROSYN) 500 MG tablet, Take 500 mg by mouth 2 (two) times daily., Disp: , Rfl:    omeprazole (PRILOSEC) 40 MG capsule, TAKE 1 CAPSULE BY MOUTH ONCE DAILY 30 MINUTES BEFORE BREAKFAST, Disp: , Rfl:    PARoxetine (PAXIL) 10 MG tablet, TAKE 1 & 1 2 (ONE & ONE HALF) TABLETS BY MOUTH ONCE DAILY, Disp: , Rfl:    polyethylene glycol powder (MIRALAX ) 17 GM/SCOOP powder, Take 17 g by mouth daily., Disp: 510 g, Rfl: 2   traZODone  (DESYREL ) 50 MG tablet, Take 1 tablet (50 mg total) by mouth at bedtime., Disp: 30 tablet, Rfl: 1   Vitamin D , Ergocalciferol , (DRISDOL ) 1.25 MG (50000 UNIT) CAPS capsule, Take 1 capsule (50,000 Units total) by mouth every 7 (seven) days., Disp: 12 capsule, Rfl: 1    ALLERGIES   Penicillin g  BP 130/80 (BP Location: Right Arm, Patient Position: Sitting, Cuff Size: Large)   Pulse 92   Temp 98.8 F (37.1 C) (Oral)   Ht 5' 4 (1.626 m)   Wt 212 lb 6.4 oz (96.3 kg)   SpO2 92%   BMI 36.46 kg/m    Review of Systems: Gen:  Denies  fever,  sweats, chills weight loss  HEENT: Denies blurred vision, double vision, ear pain, eye pain, hearing loss, nose bleeds, sore throat Cardiac:  No dizziness, chest pain or heaviness, chest tightness,edema, No JVD Resp:   +cough, -sputum production, +shortness of breath,+wheezing, -hemoptysis,  Other:  All other systems negative   Physical Examination:   General Appearance: No distress  EYES PERRLA, EOM intact.   NECK Supple, No JVD Pulmonary: normal breath sounds, No wheezing.  CardiovascularNormal S1,S2.  No m/r/g.   Abdomen: Benign, Soft, non-tender. Neurology UE/LE 5/5 strength, no focal deficits Ext pulses intact, cap refill intact ALL OTHER ROS ARE NEGATIVE  CBC    Component Value Date/Time   WBC 8.3 07/01/2023 1123   RBC 5.20 (H) 07/01/2023 1123   HGB 15.8 (H) 07/01/2023 1123   HCT 46.9 (H) 07/01/2023 1123   PLT 261.0 07/01/2023 1123   MCV 90.2 07/01/2023 1123   MCH 30.7 06/07/2022 0947   MCHC 33.7 07/01/2023 1123   RDW 14.5 07/01/2023 1123   LYMPHSABS 3.4 07/01/2023 1123   MONOABS 0.4 07/01/2023 1123   EOSABS 0.1 07/01/2023 1123   BASOSABS 0.0 07/01/2023 1123      Latest Ref Rng & Units 08/12/2023    9:10 AM 07/01/2023   11:23 AM 06/04/2022   11:14 AM  BMP  Glucose 70 - 99 mg/dL 161  096    BUN 6 - 23 mg/dL 10  6    Creatinine 0.45 - 1.20 mg/dL 4.09  8.11  9.14   Sodium 135 - 145 mEq/L 139  135    Potassium 3.5 - 5.1 mEq/L 4.2  3.9    Chloride 96 - 112 mEq/L 101  97    CO2 19 - 32 mEq/L 30  26    Calcium 8.4 - 10.5 mg/dL 9.6  9.3          ASSESSMENT/PLAN   58 year old pleasant white female seen today for assessment of COPD in the setting of significant smoking history with morbid obesity deconditioned state undergoing gallbladder surgery assessment  Assessment of COPD Recommend pulmonary function testing Start Advair HFA 2 puffs in the morning 2 puffs at night Rinse mouth after use Ventolin  as needed Avoid Allergens and Irritants Avoid secondhand  smoke Avoid SICK contacts Recommend  Masking  when appropriate Recommend Keep up-to-date with vaccinations    Surgical preop assessment-gallbladder surgery Patient is a Moderate  risk for postop and Intra-Op complications due to her Lung disease  I have discussed that there is always a increased risk Pulmonary Infection, increased chance of Respiratory Failure and Cardiac Arrest, increased chance of pneumothorax and collapsed lung, as well as increased Stroke and Death. I have explained Risks to patient   At this time, Patient is at optimal medical management.  Definite risk factors for post-operative pulmonary complications include age >75, COPD, CHF, ASA class >2, functional dependence, OSA, pulmonary HTN, baseline hypoxia, poor nutritional status, surgery >3 hours, emergency surgery and high-risk surgical sites (AAA, upper abdomen, thoracic, head/neck). - ABG is unlikely to aid in pre-op evaluation  General Risk Reduction Strategies: - All patients warrant post-operative incentive spirometry. For those with obstruction, also consider flutter valve. - Early ambulation, PT/OT - DVT prophylaxis where appropriate - Adequate pain control without oversedation    RISK FOR PROLONGED MECHANICAL VENTILAION - > 48h  Arozullah - Prolonged mech ventilation risk Arozullah Postperative Pulmonary Risk Score - for mech ventilation dependence >48h USAA, Ann Surg 2000, major non-cardiac surgery) Comment Score  Type of surgery - abd ao aneurysm (27), thoracic (21), neurosurgery / upper abdominal / vascular (21), neck (11) GB  21  Emergency Surgery - (11)    ALbumin < 3 or poor nutritional state - (9) Morbid obesity 9  BUN > 30 -  (8)    Partial or completely dependent functional status - (7)    COPD -  (6) COPD 6  Age - 60 to 75 (4), > 70  (6)    TOTAL  36  Risk Stratifcation scores  - < 10 (0.5%), 11-19 (1.8%), 20-27 (4.2%), 28-40 (10.1%), >40 (26.6%)  10%     Smoking  Assessment and Cessation Counseling Upon further questioning, Patient smokes 2 ppd I have advised patient to quit/stop smoking as soon as possible due to high risk for multiple medical problems Patient  NOT willing to quit smoking I have advised patient that we can assist and have options of Nicotine  replacement therapy. I also advised patient on behavioral therapy and can provide oral medication therapy in conjunction with the other therapies Follow up next Office visit  for assessment of smoking cessation Smoking cessation counseling advised for >10  minutes    Patient with obesity smoking history diabetes equivalent to CAD Recommend cardiology referral for assessment    MEDICATION ADJUSTMENTS/LABS AND TESTS ORDERED: PFTs Overnight pulse oximetry Moderate risk factor for lung problems Smoking cessation Cardiology referral Advair HFA Rinse mouth after use  CURRENT MEDICATIONS REVIEWED AT LENGTH WITH PATIENT TODAY   Patient  satisfied with Plan of action and management. All questions answered  Follow up 3 months  I spent a total of 93 minutes reviewing chart data, face-to-face evaluation with the patient, counseling and coordination of care as detailed above.     Lady Pier, M.D.  [facility]  Medical Director [facility] Medical Director [facility] Cardio-Pulmonary Department

## 2023-11-27 ENCOUNTER — Telehealth: Payer: Self-pay | Admitting: General Surgery

## 2023-11-27 ENCOUNTER — Telehealth: Payer: Self-pay | Admitting: Family Medicine

## 2023-11-27 ENCOUNTER — Ambulatory Visit: Payer: Self-pay | Admitting: General Surgery

## 2023-11-27 ENCOUNTER — Encounter: Payer: Self-pay | Admitting: General Surgery

## 2023-11-27 ENCOUNTER — Ambulatory Visit (INDEPENDENT_AMBULATORY_CARE_PROVIDER_SITE_OTHER): Admitting: General Surgery

## 2023-11-27 VITALS — BP 160/80 | HR 82 | Ht 64.0 in | Wt 211.0 lb

## 2023-11-27 DIAGNOSIS — K8050 Calculus of bile duct without cholangitis or cholecystitis without obstruction: Secondary | ICD-10-CM

## 2023-11-27 DIAGNOSIS — K8020 Calculus of gallbladder without cholecystitis without obstruction: Secondary | ICD-10-CM | POA: Diagnosis not present

## 2023-11-27 NOTE — Progress Notes (Signed)
 Patient ID: Kristen Wong, female   DOB: 1966/05/09, 57 y.o.   MRN: [id number] CC: Biliary Colic History of Present Illness Kristen Wong is a 58 y.o. female with .  Past medical history significant for COPD who presents in consultation for biliary colic.  The patient reports that she has intermittent right upper quadrant pain.  She said this is also associated with nausea.  She says that sometimes it does get relieved with using the bathroom.  She denies any episodes of emesis, fevers or chills when she has the pain.  She does not think that it has any temporal relationship with food although it does last for a little bit and then spontaneously goes away.  She had an ultrasound that I personally reviewed that showed a 3 cm stone within the gallbladder lumen.  She does have a history of COPD and has seen her pulmonologist who has not advised any additional interventions or testing prior to surgical intervention.  She has a abdominal surgical history significant for C-section and a hysterectomy.  Past Medical History Past Medical History:  Diagnosis Date   Arthritis    KNEES   Atypical chest pain 07/28/2023   Blood dyscrasia    POLYCYTHEMIA   COPD (chronic obstructive pulmonary disease) (HCC)    Depression    H/O   Diabetes mellitus without complication (HCC) 2018   Type 2-NEWLY DX   Fatty liver    Fatty liver    GERD (gastroesophageal reflux disease)    OCC   Heart murmur    ASYMPTOMATIC   Hypertension    Irritable bowel syndrome    Polycythemia    Vitamin B 12 deficiency 07/13/2023       Past Surgical History:  Procedure Laterality Date   ABDOMINAL HYSTERECTOMY     CESAREAN SECTION     COLONOSCOPY WITH PROPOFOL  N/A 08/08/2017   Procedure: COLONOSCOPY WITH PROPOFOL ;  Surgeon: Cassie Click, MD;  Location: [facility] ENDOSCOPY;  Service: Endoscopy;  Laterality: N/A;   COLONOSCOPY WITH PROPOFOL  N/A 01/12/2021   Procedure: COLONOSCOPY WITH PROPOFOL ;  Surgeon: Shane Darling, MD;   Location: [facility] ENDOSCOPY;  Service: Endoscopy;  Laterality: N/A;  DM   COLONOSCOPY WITH PROPOFOL  N/A 05/11/2021   Procedure: COLONOSCOPY WITH PROPOFOL ;  Surgeon: Shane Darling, MD;  Location: [facility] ENDOSCOPY;  Service: Endoscopy;  Laterality: N/A;  DM   COLONOSCOPY WITH PROPOFOL  N/A 02/08/2022   Procedure: COLONOSCOPY WITH PROPOFOL ;  Surgeon: Shane Darling, MD;  Location: [facility] ENDOSCOPY;  Service: Endoscopy;  Laterality: N/A;   DIAGNOSTIC LAPAROSCOPY     ESOPHAGOGASTRODUODENOSCOPY (EGD) WITH PROPOFOL  N/A 08/08/2017   Procedure: ESOPHAGOGASTRODUODENOSCOPY (EGD) WITH PROPOFOL ;  Surgeon: Cassie Click, MD;  Location: [facility] ENDOSCOPY;  Service: Endoscopy;  Laterality: N/A;   KNEE SURGERY     [facility]   LAPAROSCOPIC HYSTERECTOMY Bilateral 01/22/2017   Procedure: HYSTERECTOMY TOTAL LAPAROSCOPIC BSO;  Surgeon: Darl Edu, MD;  Location: [facility] ORS;  Service: Gynecology;  Laterality: Bilateral;    Allergies  Allergen Reactions   Penicillin G Nausea Only    Has patient had a PCN reaction causing immediate rash, facial/tongue/throat swelling, SOB or lightheadedness with hypotension: No Has patient had a PCN reaction causing severe rash involving mucus membranes or skin necrosis: No Has patient had a PCN reaction that required hospitalization: No Has patient had a PCN reaction occurring within the last 10 years: No If all of the above answers are NO, then may proceed with Cephalosporin use.     Current  Outpatient Medications  Medication Sig Dispense Refill   albuterol  (VENTOLIN  HFA) 108 (90 Base) MCG/ACT inhaler Inhale 1-2 puffs into the lungs every 6 (six) hours as needed. 8.7 g 3   aspirin EC 81 MG tablet Take 81 mg by mouth daily. Swallow whole.     atorvastatin (LIPITOR) 20 MG tablet Take 20 mg by mouth daily.     busPIRone (BUSPAR) 7.5 MG tablet Take 7.5 mg by mouth 2 (two) times daily.     carvedilol  (COREG ) 3.125 MG tablet TAKE 1 TABLET BY MOUTH TWICE A DAY WITH A MEAL 60  tablet 11   cetirizine (ZYRTEC) 10 MG tablet Take 20 mg by mouth daily.     etodolac (LODINE) 400 MG tablet Take 400 mg by mouth 2 (two) times daily.     fluticasone (FLONASE) 50 MCG/ACT nasal spray Place 2 sprays into both nostrils daily.     ibuprofen  (ADVIL ,MOTRIN ) 600 MG tablet Take 1 tablet (600 mg total) by mouth every 6 (six) hours as needed for headache, moderate pain or cramping. 60 tablet 1   ketoconazole  (NIZORAL ) 2 % shampoo APPLY 1 APPLICATION TOPICALLY 2 (TWO) TIMES A WEEK. 120 mL 0   metFORMIN  (GLUCOPHAGE ) 500 MG tablet Take 2 tablets (1,000 mg total) by mouth 2 (two) times daily with a meal. 60 tablet 1   naproxen (NAPROSYN) 500 MG tablet Take 500 mg by mouth 2 (two) times daily.     omeprazole (PRILOSEC) 40 MG capsule TAKE 1 CAPSULE BY MOUTH ONCE DAILY 30 MINUTES BEFORE BREAKFAST     PARoxetine (PAXIL) 10 MG tablet TAKE 1 & 1 2 (ONE & ONE HALF) TABLETS BY MOUTH ONCE DAILY     polyethylene glycol powder (MIRALAX ) 17 GM/SCOOP powder Take 17 g by mouth daily. 510 g 2   traZODone  (DESYREL ) 50 MG tablet Take 1 tablet (50 mg total) by mouth at bedtime. 30 tablet 1   Vitamin D , Ergocalciferol , (DRISDOL ) 1.25 MG (50000 UNIT) CAPS capsule Take 1 capsule (50,000 Units total) by mouth every 7 (seven) days. 12 capsule 1   dapagliflozin  propanediol (FARXIGA ) 10 MG TABS tablet Take 1 tablet (10 mg total) by mouth daily before breakfast. (Patient not taking: Reported on 11/27/2023) 30 tablet 11   No current facility-administered medications for this visit.    Family History Family History  Problem Relation Age of Onset   Diabetes Mother    Emphysema Mother    Hypertension Father    Diabetes Father    Diabetes Sister    Breast cancer Neg Hx        Social History Social History   Tobacco Use   Smoking status: Every Day    Current packs/day: 2.00    Average packs/day: 2.0 packs/day for 30.0 years (60.0 ttl pk-yrs)    Types: Cigarettes    Passive exposure: Past   Smokeless  tobacco: Never  Vaping Use   Vaping status: Never Used  Substance Use Topics   Alcohol use: No   Drug use: No        ROS Full ROS of systems performed and is otherwise negative there than what is stated in the HPI  Physical Exam Blood pressure (!) 160/80, pulse 82, height 5' 4 (1.626 m), weight 211 lb (95.7 kg), SpO2 94%.  Alert and oriented x 3, normal work of breathing on room air, regular rate and rhythm, abdomen is obese, soft, mild tenderness in the right upper quadrant without rebound tenderness or guarding, well-healed surgical scars on her  lower abdomen.  Data Reviewed I have independently reviewed her last labs.  She had a normal total bilirubin.  Her last A1c was 7.5.  I reviewed her ultrasound that showed a gallbladder stone that was approximately 3 cm.  I have personally reviewed the patient's imaging and medical records.    Assessment    58 year old woman with COPD who presents with likely biliary colic pain.  She also has a significant stone within the gallbladder wall lumen but no evidence of cholecystitis on ultrasound.  Plan    I recommended that she undergo cholecystectomy.  I discussed the risk, benefits alternatives of the procedure including risk of infection, bleeding, need for open procedure, bile leak, retained stone and injury to the common bile duct.  I discussed with her that she has an increased risk given that she is an active smoker and has a history of COPD.  I also discussed the risk of pulmonary complications.  She understands these risks and wishes to proceed.  She will stop her aspirin today.  Will plan for next Wednesday.  We will use ICG    Barrett Lick 11/27/2023, 1:30 PM

## 2023-11-27 NOTE — Telephone Encounter (Signed)
 Patient has been advised of Pre-Admission date/time, and Surgery date at [facility].  Surgery Date: 12/03/23 Preadmission Testing Date: 11/28/23 (phone 1p-4p)  Patient informed of the scheduling process and surgery information given at time of office visit.   Patient has been made aware to call [phone], between 1-3:00pm the day before surgery, to find out what time to arrive for surgery.

## 2023-11-27 NOTE — Patient Instructions (Signed)
 You have requested to have your gallbladder removed. This will be done at [facility] with Dr. Maurine Minister.  You will most likely be out of work 1-2 weeks for this surgery.  If you have FMLA or disability paperwork that needs filled out you may drop this off at our office or this can be faxed to [phone].  You will return after your post-op appointment with a lifting restriction for approximately 4 more weeks.  You will be able to eat anything you would like to following surgery. But, start by eating a bland diet and advance this as tolerated. The Gallbladder diet is below, please go as closely by this diet as possible prior to surgery to avoid any further attacks.  Please see the (blue)pre-care form that you have been given today. Our surgery scheduler will call you to verify surgery date and to go over information.   If you have any questions, please call our office.  Laparoscopic Cholecystectomy Laparoscopic cholecystectomy is surgery to remove the gallbladder. The gallbladder is located in the upper right part of the abdomen, behind the liver. It is a storage sac for bile, which is produced in the liver. Bile aids in the digestion and absorption of fats. Cholecystectomy is often done for inflammation of the gallbladder (cholecystitis). This condition is usually caused by a buildup of gallstones (cholelithiasis) in the gallbladder. Gallstones can block the flow of bile, and that can result in inflammation and pain. In severe cases, emergency surgery may be required. If emergency surgery is not required, you will have time to prepare for the procedure. Laparoscopic surgery is an alternative to open surgery. Laparoscopic surgery has a shorter recovery time. Your common bile duct may also need to be examined during the procedure. If stones are found in the common bile duct, they may be removed. LET [facility] CARE PROVIDER KNOW ABOUT: Any allergies you have. All medicines you are taking,  including vitamins, herbs, eye drops, creams, and over-the-counter medicines. Previous problems you or members of your family have had with the use of anesthetics. Any blood disorders you have. Previous surgeries you have had.  Any medical conditions you have. RISKS AND COMPLICATIONS Generally, this is a safe procedure. However, problems may occur, including: Infection. Bleeding. Allergic reactions to medicines. Damage to other structures or organs. A stone remaining in the common bile duct. A bile leak from the cyst duct that is clipped when your gallbladder is removed. The need to convert to open surgery, which requires a larger incision in the abdomen. This may be necessary if your surgeon thinks that it is not safe to continue with a laparoscopic procedure. BEFORE THE PROCEDURE Ask your health care provider about: Changing or stopping your regular medicines. This is especially important if you are taking diabetes medicines or blood thinners. Taking medicines such as aspirin and ibuprofen. These medicines can thin your blood. Do not take these medicines before your procedure if your health care provider instructs you not to. Follow instructions from your health care provider about eating or drinking restrictions. Let your health care provider know if you develop a cold or an infection before surgery. Plan to have someone take you home after the procedure. Ask your health care provider how your surgical site will be marked or identified. You may be given antibiotic medicine to help prevent infection. PROCEDURE To reduce your risk of infection: Your health care team will wash or sanitize their hands. Your skin will be washed with soap. An IV  tube may be inserted into one of your veins. You will be given a medicine to make you fall asleep (general anesthetic). A breathing tube will be placed in your mouth. The surgeon will make several small cuts (incisions) in your abdomen. A thin,  lighted tube (laparoscope) that has a tiny camera on the end will be inserted through one of the small incisions. The camera on the laparoscope will send a picture to a TV screen (monitor) in the operating room. This will give the surgeon a good view inside your abdomen. A gas will be pumped into your abdomen. This will expand your abdomen to give the surgeon more room to perform the surgery. Other tools that are needed for the procedure will be inserted through the other incisions. The gallbladder will be removed through one of the incisions. After your gallbladder has been removed, the incisions will be closed with stitches (sutures), staples, or skin glue. Your incisions may be covered with a bandage (dressing). The procedure may vary among health care providers and hospitals. AFTER THE PROCEDURE Your blood pressure, heart rate, breathing rate, and blood oxygen level will be monitored often until the medicines you were given have worn off. You will be given medicines as needed to control your pain.   This information is not intended to replace advice given to you by your health care provider. Make sure you discuss any questions you have with your health care provider.   Document Released: 05/27/2005 Document Revised: 02/15/2015 Document Reviewed: 01/06/2013 Elsevier Interactive Patient Education 2016 [employer] Inc[employer]   Low-Fat Diet for Gallbladder Conditions A low-fat diet can be helpful if you have pancreatitis or a gallbladder condition. With these conditions, your pancreas and gallbladder have trouble digesting fats. A healthy eating plan with less fat will help rest your pancreas and gallbladder and reduce your symptoms. WHAT DO I NEED TO KNOW ABOUT THIS DIET? Eat a low-fat diet. Reduce your fat intake to less than 20-30% of your total daily calories. This is less than 50-60 g of fat per day. Remember that you need some fat in your diet. Ask your dietician what your daily goal should  be. Choose nonfat and low-fat healthy foods. Look for the words "nonfat," "low fat," or "fat free." As a guide, look on the label and choose foods with less than 3 g of fat per serving. Eat only one serving. Avoid alcohol. Do not smoke. If you need help quitting, talk with your health care provider. Eat small frequent meals instead of three large heavy meals. WHAT FOODS CAN I EAT? Grains Include healthy grains and starches such as potatoes, wheat bread, fiber-rich cereal, and brown rice. Choose whole grain options whenever possible. In adults, whole grains should account for 45-65% of your daily calories.  Fruits and Vegetables Eat plenty of fruits and vegetables. Fresh fruits and vegetables add fiber to your diet. Meats and Other Protein Sources Eat lean meat such as chicken and pork. Trim any fat off of meat before cooking it. Eggs, fish, and beans are other sources of protein. In adults, these foods should account for 10-35% of your daily calories. Dairy Choose low-fat milk and dairy options. Dairy includes fat and protein, as well as calcium.  Fats and Oils Limit high-fat foods such as fried foods, sweets, baked goods, sugary drinks.  Other Creamy sauces and condiments, such as mayonnaise, can add extra fat. Think about whether or not you need to use them, or use smaller amounts or low fat options.  WHAT FOODS ARE NOT RECOMMENDED? High fat foods, such as: [employer]. Ice cream. [place] toast. Sweet rolls. Pizza. Cheese bread. Foods covered with batter, butter, creamy sauces, or cheese. Fried foods. Sugary drinks and desserts. Foods that cause gas or bloating   This information is not intended to replace advice given to you by your health care provider. Make sure you discuss any questions you have with your health care provider.   Document Released: 06/01/2013 Document Reviewed: 06/01/2013 Elsevier Interactive Patient Education [employer]

## 2023-11-27 NOTE — Telephone Encounter (Signed)
 err

## 2023-11-28 ENCOUNTER — Encounter
Admission: RE | Admit: 2023-11-28 | Discharge: 2023-11-28 | Disposition: A | Discharge status: Home / Self Care | Source: Ambulatory Visit | Attending: General Surgery | Admitting: General Surgery

## 2023-11-28 ENCOUNTER — Other Ambulatory Visit: Payer: Self-pay

## 2023-11-28 VITALS — Ht 64.0 in | Wt 211.0 lb

## 2023-11-28 DIAGNOSIS — D751 Secondary polycythemia: Secondary | ICD-10-CM

## 2023-11-28 DIAGNOSIS — E1129 Type 2 diabetes mellitus with other diabetic kidney complication: Secondary | ICD-10-CM

## 2023-11-28 DIAGNOSIS — I700 Atherosclerosis of aorta: Secondary | ICD-10-CM

## 2023-11-28 DIAGNOSIS — E8881 Metabolic syndrome: Secondary | ICD-10-CM

## 2023-11-28 DIAGNOSIS — R748 Abnormal levels of other serum enzymes: Secondary | ICD-10-CM

## 2023-11-28 NOTE — Patient Instructions (Addendum)
 Your procedure is scheduled on: Wednesday 12/03/23 To find out your arrival time, please call [phone] between 1PM - 3PM on:   Tuesday 12/02/23 Report to the Registration Desk on the 1st floor of the [facility]. Free Valet parking is available.  If your arrival time is 6:00 am, do not arrive before that time as the [facility] entrance doors do not open until 6:00 am.  REMEMBER: Instructions that are not followed completely may result in serious medical risk, up to and including death; or upon the discretion of your surgeon and anesthesiologist your surgery may need to be rescheduled.  Do not eat food or drink any liquids after midnight the night before surgery.  No gum chewing or hard candies.  One week prior to surgery: Stop Anti-inflammatories (NSAIDS) such as Advil , Aleve, Ibuprofen , Motrin , Naproxen, Naprosyn and Aspirin based products such as Excedrin, Goody's Powder, BC Powder. Stop etodolac (Lodine), ibuprofen  (Advil , Motrin ), naproxen (Naprosyn) Today 11/28/23 You may however, continue to take Tylenol  if needed for pain up until the day of surgery.  Stop ANY OVER THE COUNTER supplements and vitamins Today until after surgery.  Continue taking all prescribed medications with the exception of the following: Etodolac/Lodine, Metformin  last dose Sunday night 11/30/23,  TAKE ONLY THESE MEDICATIONS THE MORNING OF SURGERY WITH A SIP OF WATER:  PARoxetine (PAXIL) 15 MG, 1 1/2 tablet  omeprazole (PRILOSEC) 40 MG capsule Antacid (take one the night before and one on the morning of surgery - helps to prevent nausea after surgery.) carvedilol  (COREG ) 3.125 MG tablet  busPIRone (BUSPAR) 7.5 MG tablet   Use inhalers on the day of surgery and bring to the hospital.  No Alcohol for 24 hours before or after surgery.  No Smoking including e-cigarettes for 24 hours before surgery.  No chewable tobacco products for at least 6 hours before surgery.  No nicotine  patches on the day of  surgery.  Do not use any recreational drugs for at least a week (preferably 2 weeks) before your surgery.  Please be advised that the combination of cocaine and anesthesia may have negative outcomes, up to and including death. If you test positive for cocaine, your surgery will be cancelled.  On the morning of surgery brush your teeth with toothpaste and water, you may rinse your mouth with mouthwash if you wish. Do not swallow any toothpaste or mouthwash.  Use CHG Soap or wipes as directed on instruction sheet.  Do not wear lotions, powders, or perfumes.   Do not shave body hair from the neck down 48 hours before surgery.  Wear comfortable clothing (specific to your surgery type) to the hospital.  Do not wear jewelry, make-up, hairpins, clips or nail polish.  For welded (permanent) jewelry: bracelets, anklets, waist bands, etc.  Please have this removed prior to surgery.  If it is not removed, there is a chance that hospital personnel will need to cut it off on the day of surgery. Contact lenses, hearing aids and dentures may not be worn into surgery.  Do not bring valuables to the hospital. [facility] is not responsible for any missing/lost belongings or valuables.   Notify your doctor if there is any change in your medical condition (cold, fever, infection).  If you are being discharged the day of surgery, you will not be allowed to drive home. You will need a responsible individual to drive you home and stay with you for 24 hours after surgery.   If you are taking public transportation,  you will need to have a responsible individual with you.  After surgery, you can help prevent lung complications by doing breathing exercises.  Take deep breaths and cough every 1-2 hours. Your doctor may order a device called an Incentive Spirometer to help you take deep breaths. When coughing or sneezing, hold a pillow firmly against your incision with both hands. This is called "splinting."  Doing this helps protect your incision. It also decreases belly discomfort.  Surgery Visitation Policy:  Patients undergoing a surgery or procedure may have two family members or support persons with them as long as the person is not COVID-19 positive or experiencing its symptoms.   Please call the Pre-admissions Testing Dept. at [phone] if you have any questions about these instructions.     Preparing for Surgery with CHLORHEXIDINE  GLUCONATE (CHG) Soap  Chlorhexidine  Gluconate (CHG) Soap  o An antiseptic cleaner that kills germs and bonds with the skin to continue killing germs even after washing  o Used for showering the night before surgery and morning of surgery  Before surgery, you can play an important role by reducing the number of germs on your skin.  CHG (Chlorhexidine  gluconate) soap is an antiseptic cleanser which kills germs and bonds with the skin to continue killing germs even after washing.  Please do not use if you have an allergy to CHG or antibacterial soaps. If your skin becomes reddened/irritated stop using the CHG.  1. Shower the NIGHT BEFORE SURGERY and the MORNING OF SURGERY with CHG soap.  2. If you choose to wash your hair, wash your hair first as usual with your normal shampoo.  3. After shampooing, rinse your hair and body thoroughly to remove the shampoo.  4. Use CHG as you would any other liquid soap. You can apply CHG directly to the skin and wash gently with a scrungie or a clean washcloth.  5. Apply the CHG soap to your body only from the neck down. Do not use on open wounds or open sores. Avoid contact with your eyes, ears, mouth, and genitals (private parts). Wash face and genitals (private parts) with your normal soap.  6. Wash thoroughly, paying special attention to the area where your surgery will be performed.  7. Thoroughly rinse your body with warm water.  8. Do not shower/wash with your normal soap after using and rinsing off the  CHG soap.  9. Pat yourself dry with a clean towel.  10. Wear clean pajamas to bed the night before surgery.  12. Place clean sheets on your bed the night of your first shower and do not sleep with pets.  13. Shower again with the CHG soap on the day of surgery prior to arriving at the hospital.  14. Do not apply any deodorants/lotions/powders.  15. Please wear clean clothes to the hospital.

## 2023-11-30 ENCOUNTER — Encounter: Payer: Self-pay | Admitting: Urgent Care

## 2023-12-01 ENCOUNTER — Encounter: Payer: Self-pay | Admitting: Urgent Care

## 2023-12-01 ENCOUNTER — Encounter
Admission: RE | Admit: 2023-12-01 | Discharge: 2023-12-01 | Disposition: A | Discharge status: Home / Self Care | Source: Ambulatory Visit | Attending: General Surgery | Admitting: General Surgery

## 2023-12-01 DIAGNOSIS — E8881 Metabolic syndrome: Secondary | ICD-10-CM | POA: Insufficient documentation

## 2023-12-01 DIAGNOSIS — I700 Atherosclerosis of aorta: Secondary | ICD-10-CM | POA: Insufficient documentation

## 2023-12-01 DIAGNOSIS — D751 Secondary polycythemia: Secondary | ICD-10-CM | POA: Insufficient documentation

## 2023-12-01 DIAGNOSIS — R748 Abnormal levels of other serum enzymes: Secondary | ICD-10-CM | POA: Diagnosis not present

## 2023-12-01 DIAGNOSIS — Z01812 Encounter for preprocedural laboratory examination: Secondary | ICD-10-CM | POA: Diagnosis present

## 2023-12-01 LAB — COMPREHENSIVE METABOLIC PANEL WITH GFR
ALT: 129 U/L — ABNORMAL HIGH (ref 0–44)
AST: 130 U/L — ABNORMAL HIGH (ref 15–41)
Albumin: 3.5 g/dL (ref 3.5–5.0)
Alkaline Phosphatase: 96 U/L (ref 38–126)
Anion gap: 8 (ref 5–15)
BUN: 7 mg/dL (ref 6–20)
CO2: 25 mmol/L (ref 22–32)
Calcium: 8.8 mg/dL — ABNORMAL LOW (ref 8.9–10.3)
Chloride: 99 mmol/L (ref 98–111)
Creatinine, Ser: 0.48 mg/dL (ref 0.44–1.00)
GFR, Estimated: >60 mL/min (ref >60)
Glucose, Bld: 373 mg/dL — ABNORMAL HIGH (ref 70–99)
Potassium: 3.7 mmol/L (ref 3.5–5.1)
Sodium: 132 mmol/L — ABNORMAL LOW (ref 135–145)
Total Bilirubin: 0.7 mg/dL (ref 0.0–1.2)
Total Protein: 7.3 g/dL (ref 6.5–8.1)

## 2023-12-01 LAB — CBC
HCT: 48.2 % — ABNORMAL HIGH (ref 36.0–46.0)
Hemoglobin: 16.6 g/dL — ABNORMAL HIGH (ref 12.0–15.0)
MCH: 30.2 pg (ref 26.0–34.0)
MCHC: 34.4 g/dL (ref 30.0–36.0)
MCV: 87.6 fL (ref 80.0–100.0)
Platelets: 201 10*3/uL (ref 150–400)
RBC: 5.5 MIL/uL — ABNORMAL HIGH (ref 3.87–5.11)
RDW: 13 % (ref 11.5–15.5)
WBC: 9 10*3/uL (ref 4.0–10.5)
nRBC: 0 % (ref 0.0–0.2)

## 2023-12-03 ENCOUNTER — Other Ambulatory Visit: Payer: Self-pay

## 2023-12-03 ENCOUNTER — Ambulatory Visit: Admitting: Certified Registered"

## 2023-12-03 ENCOUNTER — Encounter: Payer: Self-pay | Admitting: General Surgery

## 2023-12-03 ENCOUNTER — Encounter
Admission: AD | Disposition: A | Discharge status: Home / Self Care | Payer: Self-pay | Source: Home / Self Care | Attending: General Surgery

## 2023-12-03 ENCOUNTER — Inpatient Hospital Stay
Admission: AD | Admit: 2023-12-03 | Discharge: 2023-12-05 | DRG: 418 | Disposition: A | Discharge status: Home / Self Care | Attending: General Surgery | Admitting: General Surgery

## 2023-12-03 DIAGNOSIS — Z833 Family history of diabetes mellitus: Secondary | ICD-10-CM | POA: Diagnosis not present

## 2023-12-03 DIAGNOSIS — F17210 Nicotine dependence, cigarettes, uncomplicated: Secondary | ICD-10-CM | POA: Diagnosis present

## 2023-12-03 DIAGNOSIS — E119 Type 2 diabetes mellitus without complications: Secondary | ICD-10-CM | POA: Diagnosis present

## 2023-12-03 DIAGNOSIS — Z825 Family history of asthma and other chronic lower respiratory diseases: Secondary | ICD-10-CM | POA: Diagnosis not present

## 2023-12-03 DIAGNOSIS — J449 Chronic obstructive pulmonary disease, unspecified: Secondary | ICD-10-CM | POA: Diagnosis present

## 2023-12-03 DIAGNOSIS — K838 Other specified diseases of biliary tract: Secondary | ICD-10-CM

## 2023-12-03 DIAGNOSIS — I10 Essential (primary) hypertension: Secondary | ICD-10-CM | POA: Diagnosis present

## 2023-12-03 DIAGNOSIS — Z7984 Long term (current) use of oral hypoglycemic drugs: Secondary | ICD-10-CM

## 2023-12-03 DIAGNOSIS — Z90710 Acquired absence of both cervix and uterus: Secondary | ICD-10-CM

## 2023-12-03 DIAGNOSIS — Z79899 Other long term (current) drug therapy: Secondary | ICD-10-CM

## 2023-12-03 DIAGNOSIS — K9189 Other postprocedural complications and disorders of digestive system: Secondary | ICD-10-CM | POA: Diagnosis not present

## 2023-12-03 DIAGNOSIS — Z8249 Family history of ischemic heart disease and other diseases of the circulatory system: Secondary | ICD-10-CM | POA: Diagnosis not present

## 2023-12-03 DIAGNOSIS — Z880 Allergy status to penicillin: Secondary | ICD-10-CM | POA: Diagnosis not present

## 2023-12-03 DIAGNOSIS — K810 Acute cholecystitis: Principal | ICD-10-CM | POA: Diagnosis present

## 2023-12-03 DIAGNOSIS — K812 Acute cholecystitis with chronic cholecystitis: Principal | ICD-10-CM | POA: Diagnosis present

## 2023-12-03 DIAGNOSIS — E871 Hypo-osmolality and hyponatremia: Secondary | ICD-10-CM | POA: Diagnosis not present

## 2023-12-03 DIAGNOSIS — K82A1 Gangrene of gallbladder in cholecystitis: Secondary | ICD-10-CM | POA: Diagnosis present

## 2023-12-03 DIAGNOSIS — K839 Disease of biliary tract, unspecified: Secondary | ICD-10-CM | POA: Diagnosis not present

## 2023-12-03 DIAGNOSIS — K8050 Calculus of bile duct without cholangitis or cholecystitis without obstruction: Secondary | ICD-10-CM

## 2023-12-03 DIAGNOSIS — E1129 Type 2 diabetes mellitus with other diabetic kidney complication: Secondary | ICD-10-CM

## 2023-12-03 DIAGNOSIS — K8010 Calculus of gallbladder with chronic cholecystitis without obstruction: Secondary | ICD-10-CM | POA: Diagnosis not present

## 2023-12-03 DIAGNOSIS — Z7982 Long term (current) use of aspirin: Secondary | ICD-10-CM

## 2023-12-03 DIAGNOSIS — R809 Proteinuria, unspecified: Secondary | ICD-10-CM

## 2023-12-03 DIAGNOSIS — K8020 Calculus of gallbladder without cholecystitis without obstruction: Secondary | ICD-10-CM | POA: Diagnosis present

## 2023-12-03 HISTORY — DX: Acute cholecystitis: K81.0

## 2023-12-03 HISTORY — PX: CHOLECYSTECTOMY: SHX55

## 2023-12-03 LAB — GLUCOSE, CAPILLARY
Glucose-Capillary: 279 mg/dL — ABNORMAL HIGH (ref 70–99)
Glucose-Capillary: 319 mg/dL — ABNORMAL HIGH (ref 70–99)

## 2023-12-03 SURGERY — CHOLECYSTECTOMY, ROBOT-ASSISTED, LAPAROSCOPIC
Anesthesia: General | Site: Abdomen

## 2023-12-03 MED ORDER — METHOCARBAMOL 1000 MG/10ML IJ SOLN
500.0000 mg | Freq: Four times a day (QID) | INTRAMUSCULAR | Status: DC | PRN
  Administered 2023-12-03: 500 mg via INTRAVENOUS
  Filled 2023-12-03 (×2): qty 5

## 2023-12-03 MED ORDER — LIDOCAINE HCL (CARDIAC) PF 100 MG/5ML IV SOSY
PREFILLED_SYRINGE | INTRAVENOUS | Status: DC | PRN
  Administered 2023-12-03: 60 mg via INTRAVENOUS

## 2023-12-03 MED ORDER — BUPIVACAINE LIPOSOME 1.3 % IJ SUSP
INTRAMUSCULAR | Status: AC
  Filled 2023-12-03: qty 10

## 2023-12-03 MED ORDER — DEXAMETHASONE SODIUM PHOSPHATE 10 MG/ML IJ SOLN
INTRAMUSCULAR | Status: AC
  Filled 2023-12-03: qty 1

## 2023-12-03 MED ORDER — INDOCYANINE GREEN 25 MG IV SOLR
INTRAVENOUS | Status: AC
  Filled 2023-12-03: qty 10

## 2023-12-03 MED ORDER — SODIUM CHLORIDE 0.9 % IV SOLN
2.0000 g | INTRAVENOUS | Status: AC
  Administered 2023-12-03: 2 g via INTRAVENOUS

## 2023-12-03 MED ORDER — DROPERIDOL 2.5 MG/ML IJ SOLN: 0.6250 mg | Freq: Once | INTRAMUSCULAR | Status: DC | PRN

## 2023-12-03 MED ORDER — PROPOFOL 10 MG/ML IV BOLUS
INTRAVENOUS | Status: DC | PRN
  Administered 2023-12-03: 100 mg via INTRAVENOUS

## 2023-12-03 MED ORDER — ROCURONIUM BROMIDE 100 MG/10ML IV SOLN
INTRAVENOUS | Status: DC | PRN
  Administered 2023-12-03: 50 mg via INTRAVENOUS
  Administered 2023-12-03: 30 mg via INTRAVENOUS

## 2023-12-03 MED ORDER — OXYCODONE HCL 5 MG PO TABS
5.0000 mg | ORAL_TABLET | ORAL | Status: DC | PRN
  Administered 2023-12-03: 5 mg via ORAL

## 2023-12-03 MED ORDER — ROCURONIUM BROMIDE 10 MG/ML (PF) SYRINGE
PREFILLED_SYRINGE | INTRAVENOUS | Status: AC
  Filled 2023-12-03: qty 10

## 2023-12-03 MED ORDER — INSULIN ASPART 100 UNIT/ML IJ SOLN
15.0000 [IU] | Freq: Once | INTRAMUSCULAR | Status: AC
  Administered 2023-12-03: 15 [IU] via SUBCUTANEOUS

## 2023-12-03 MED ORDER — PIPERACILLIN-TAZOBACTAM 3.375 G IVPB
3.3750 g | Freq: Three times a day (TID) | INTRAVENOUS | Status: DC
  Administered 2023-12-03 – 2023-12-05 (×5): 3.375 g via INTRAVENOUS
  Filled 2023-12-03 (×5): qty 50

## 2023-12-03 MED ORDER — FENTANYL CITRATE (PF) 100 MCG/2ML IJ SOLN
INTRAMUSCULAR | Status: DC | PRN
  Administered 2023-12-03: 25 ug via INTRAVENOUS
  Administered 2023-12-03 (×2): 50 ug via INTRAVENOUS
  Administered 2023-12-03: 25 ug via INTRAVENOUS
  Administered 2023-12-03: 50 ug via INTRAVENOUS

## 2023-12-03 MED ORDER — LACTATED RINGERS IV SOLN: INTRAVENOUS | Status: DC | PRN

## 2023-12-03 MED ORDER — ACETAMINOPHEN 500 MG PO TABS
1000.0000 mg | ORAL_TABLET | Freq: Four times a day (QID) | ORAL | Status: DC
  Administered 2023-12-03 – 2023-12-05 (×4): 1000 mg via ORAL
  Filled 2023-12-03 (×4): qty 2

## 2023-12-03 MED ORDER — CHLORHEXIDINE GLUCONATE CLOTH 2 % EX PADS: 6.0000 | MEDICATED_PAD | Freq: Once | CUTANEOUS | Status: DC

## 2023-12-03 MED ORDER — BUPIVACAINE-EPINEPHRINE (PF) 0.5% -1:200000 IJ SOLN
INTRAMUSCULAR | Status: AC
Start: 2023-12-03 — End: 2023-12-03
  Filled 2023-12-03: qty 10

## 2023-12-03 MED ORDER — PHENYLEPHRINE 80 MCG/ML (10ML) SYRINGE FOR IV PUSH (FOR BLOOD PRESSURE SUPPORT)
PREFILLED_SYRINGE | INTRAVENOUS | Status: AC
  Filled 2023-12-03: qty 10

## 2023-12-03 MED ORDER — FLUTICASONE PROPIONATE 50 MCG/ACT NA SUSP: 2.0000 | Freq: Every day | NASAL | Status: DC | PRN

## 2023-12-03 MED ORDER — SODIUM CHLORIDE 0.9 % IV SOLN
INTRAVENOUS | Status: AC
  Filled 2023-12-03: qty 2

## 2023-12-03 MED ORDER — HEMOSTATIC AGENTS (NO CHARGE) OPTIME
TOPICAL | Status: DC | PRN
  Administered 2023-12-03: 1

## 2023-12-03 MED ORDER — INSULIN ASPART 100 UNIT/ML IJ SOLN
0.0000 [IU] | Freq: Three times a day (TID) | INTRAMUSCULAR | Status: DC
  Administered 2023-12-04 (×2): 15 [IU] via SUBCUTANEOUS
  Filled 2023-12-03 (×3): qty 1

## 2023-12-03 MED ORDER — BUSPIRONE HCL 15 MG PO TABS
7.5000 mg | ORAL_TABLET | Freq: Two times a day (BID) | ORAL | Status: DC
  Administered 2023-12-03 – 2023-12-05 (×3): 7.5 mg via ORAL
  Filled 2023-12-03 (×4): qty 1

## 2023-12-03 MED ORDER — LIDOCAINE HCL (PF) 2 % IJ SOLN
INTRAMUSCULAR | Status: AC
  Filled 2023-12-03: qty 5

## 2023-12-03 MED ORDER — LACTATED RINGERS IV SOLN: INTRAVENOUS | Status: AC

## 2023-12-03 MED ORDER — 0.9 % SODIUM CHLORIDE (POUR BTL) OPTIME
TOPICAL | Status: DC | PRN
  Administered 2023-12-03: 500 mL

## 2023-12-03 MED ORDER — SUGAMMADEX SODIUM 200 MG/2ML IV SOLN
INTRAVENOUS | Status: DC | PRN
  Administered 2023-12-03: 200 mg via INTRAVENOUS

## 2023-12-03 MED ORDER — INSULIN ASPART 100 UNIT/ML IJ SOLN
INTRAMUSCULAR | Status: AC
  Filled 2023-12-03: qty 1

## 2023-12-03 MED ORDER — MORPHINE SULFATE (PF) 2 MG/ML IV SOLN
2.0000 mg | INTRAVENOUS | Status: DC | PRN
  Administered 2023-12-03 – 2023-12-04 (×6): 2 mg via INTRAVENOUS
  Filled 2023-12-03 (×6): qty 1

## 2023-12-03 MED ORDER — CHLORHEXIDINE GLUCONATE 0.12 % MT SOLN
15.0000 mL | Freq: Once | OROMUCOSAL | Status: AC
  Administered 2023-12-03: 15 mL via OROMUCOSAL

## 2023-12-03 MED ORDER — INDOCYANINE GREEN 25 MG IV SOLR
1.2500 mg | Freq: Once | INTRAVENOUS | Status: AC
  Administered 2023-12-03: 1.25 mg via INTRAVENOUS

## 2023-12-03 MED ORDER — BUPIVACAINE-EPINEPHRINE (PF) 0.5% -1:200000 IJ SOLN
INTRAMUSCULAR | Status: DC | PRN
  Administered 2023-12-03: 20 mL via SURGICAL_CAVITY

## 2023-12-03 MED ORDER — PHENYLEPHRINE 80 MCG/ML (10ML) SYRINGE FOR IV PUSH (FOR BLOOD PRESSURE SUPPORT)
PREFILLED_SYRINGE | INTRAVENOUS | Status: DC | PRN
  Administered 2023-12-03 (×2): 160 ug via INTRAVENOUS

## 2023-12-03 MED ORDER — FENTANYL CITRATE (PF) 100 MCG/2ML IJ SOLN
25.0000 ug | INTRAMUSCULAR | Status: DC | PRN
  Administered 2023-12-03 (×3): 25 ug via INTRAVENOUS

## 2023-12-03 MED ORDER — EPHEDRINE SULFATE-NACL 50-0.9 MG/10ML-% IV SOSY
PREFILLED_SYRINGE | INTRAVENOUS | Status: DC | PRN
  Administered 2023-12-03: 10 mg via INTRAVENOUS

## 2023-12-03 MED ORDER — DEXAMETHASONE SODIUM PHOSPHATE 10 MG/ML IJ SOLN
INTRAMUSCULAR | Status: DC | PRN
  Administered 2023-12-03: 5 mg via INTRAVENOUS

## 2023-12-03 MED ORDER — ACETAMINOPHEN 325 MG PO TABS
650.0000 mg | ORAL_TABLET | Freq: Four times a day (QID) | ORAL | Status: DC | PRN
  Administered 2023-12-03: 650 mg via ORAL
  Filled 2023-12-03: qty 2

## 2023-12-03 MED ORDER — OXYCODONE HCL 5 MG PO TABS
ORAL_TABLET | ORAL | Status: AC
  Filled 2023-12-03: qty 1

## 2023-12-03 MED ORDER — ALBUTEROL SULFATE (2.5 MG/3ML) 0.083% IN NEBU
2.5000 mg | INHALATION_SOLUTION | Freq: Four times a day (QID) | RESPIRATORY_TRACT | Status: DC | PRN
  Administered 2023-12-05: 2.5 mg via RESPIRATORY_TRACT
  Filled 2023-12-03: qty 3

## 2023-12-03 MED ORDER — CHLORHEXIDINE GLUCONATE 0.12 % MT SOLN
OROMUCOSAL | Status: AC
  Filled 2023-12-03: qty 15

## 2023-12-03 MED ORDER — PANTOPRAZOLE SODIUM 40 MG PO TBEC
40.0000 mg | DELAYED_RELEASE_TABLET | Freq: Every day | ORAL | Status: DC
  Administered 2023-12-05: 40 mg via ORAL
  Filled 2023-12-03: qty 1

## 2023-12-03 MED ORDER — SODIUM CHLORIDE 0.9 % IV SOLN: INTRAVENOUS | Status: DC

## 2023-12-03 MED ORDER — ONDANSETRON HCL 4 MG/2ML IJ SOLN
INTRAMUSCULAR | Status: DC | PRN
  Administered 2023-12-03: 4 mg via INTRAVENOUS

## 2023-12-03 MED ORDER — MIDAZOLAM HCL 2 MG/2ML IJ SOLN
INTRAMUSCULAR | Status: AC
  Filled 2023-12-03: qty 2

## 2023-12-03 MED ORDER — EPHEDRINE 5 MG/ML INJ
INTRAVENOUS | Status: AC
  Filled 2023-12-03: qty 5

## 2023-12-03 MED ORDER — FENTANYL CITRATE (PF) 100 MCG/2ML IJ SOLN
INTRAMUSCULAR | Status: AC
Start: 2023-12-03 — End: 2023-12-03
  Filled 2023-12-03: qty 2

## 2023-12-03 MED ORDER — TRAZODONE HCL 50 MG PO TABS
50.0000 mg | ORAL_TABLET | Freq: Every day | ORAL | Status: DC
  Administered 2023-12-03 – 2023-12-04 (×2): 50 mg via ORAL
  Filled 2023-12-03 (×2): qty 1

## 2023-12-03 MED ORDER — FENTANYL CITRATE (PF) 100 MCG/2ML IJ SOLN
INTRAMUSCULAR | Status: AC
  Filled 2023-12-03: qty 2

## 2023-12-03 MED ORDER — SODIUM CHLORIDE 0.9 % IR SOLN
Status: DC | PRN
  Administered 2023-12-03: 1000 mL

## 2023-12-03 MED ORDER — OXYCODONE HCL 5 MG PO TABS
10.0000 mg | ORAL_TABLET | ORAL | Status: DC | PRN
  Administered 2023-12-03 – 2023-12-05 (×5): 10 mg via ORAL
  Filled 2023-12-03 (×5): qty 2

## 2023-12-03 MED ORDER — PROPOFOL 10 MG/ML IV BOLUS
INTRAVENOUS | Status: AC
  Filled 2023-12-03: qty 20

## 2023-12-03 MED ORDER — MIDAZOLAM HCL 2 MG/2ML IJ SOLN
INTRAMUSCULAR | Status: DC | PRN
  Administered 2023-12-03: 2 mg via INTRAVENOUS

## 2023-12-03 MED ORDER — KETOROLAC TROMETHAMINE 15 MG/ML IJ SOLN
15.0000 mg | Freq: Three times a day (TID) | INTRAMUSCULAR | Status: DC | PRN
  Administered 2023-12-03 – 2023-12-04 (×3): 15 mg via INTRAVENOUS
  Filled 2023-12-03 (×3): qty 1

## 2023-12-03 MED ORDER — ONDANSETRON HCL 4 MG/2ML IJ SOLN
INTRAMUSCULAR | Status: AC
  Filled 2023-12-03: qty 2

## 2023-12-03 MED ORDER — ORAL CARE MOUTH RINSE: 15.0000 mL | Freq: Once | OROMUCOSAL | Status: AC

## 2023-12-03 SURGICAL SUPPLY — 45 items
BAG PRESSURE INF REUSE 1000 (BAG) IMPLANT
CANNULA REDUCER 12-8 DVNC XI (CANNULA) ×1 IMPLANT
CAUTERY HOOK MNPLR 1.6 DVNC XI (INSTRUMENTS) ×1 IMPLANT
CLIP LIGATING HEMO O LOK GREEN (MISCELLANEOUS) ×1 IMPLANT
DEFOGGER SCOPE WARMER CLEARIFY (MISCELLANEOUS) IMPLANT
DERMABOND ADVANCED .7 DNX12 (GAUZE/BANDAGES/DRESSINGS) ×1 IMPLANT
DRAIN CHANNEL 19F RND (DRAIN) IMPLANT
DRAPE ARM DVNC X/XI (DISPOSABLE) ×4 IMPLANT
DRAPE COLUMN DVNC XI (DISPOSABLE) ×1 IMPLANT
DRSG TEGADERM 4X4.75 (GAUZE/BANDAGES/DRESSINGS) IMPLANT
ELECTRODE REM PT RTRN 9FT ADLT (ELECTROSURGICAL) ×1 IMPLANT
EVACUATOR SILICONE 100CC (DRAIN) IMPLANT
FORCEPS BPLR 8 MD DVNC XI (FORCEP) IMPLANT
FORCEPS BPLR R/ABLATION 8 DVNC (INSTRUMENTS) ×1 IMPLANT
FORCEPS PROGRASP DVNC XI (FORCEP) ×1 IMPLANT
GLOVE BIOGEL PI IND STRL 7.5 (GLOVE) ×2 IMPLANT
GLOVE SURG SYN 7.0 (GLOVE) ×2 IMPLANT
GLOVE SURG SYN 7.0 PF PI (GLOVE) ×2 IMPLANT
GOWN STRL REUS W/ TWL LRG LVL3 (GOWN DISPOSABLE) ×3 IMPLANT
GRASPER SUT TROCAR 14GX15 (MISCELLANEOUS) ×1 IMPLANT
HEMOSTAT SURGICEL 2X3 (HEMOSTASIS) IMPLANT
IRRIGATOR SUCT 8 DISP DVNC XI (IRRIGATION / IRRIGATOR) IMPLANT
IV NS 1000ML BAXH (IV SOLUTION) IMPLANT
IV NS 100ML SINGLE PACK (IV SOLUTION) IMPLANT
KIT PINK PAD W/HEAD ARM REST (MISCELLANEOUS) ×1 IMPLANT
LABEL OR SOLS (LABEL) ×1 IMPLANT
MANIFOLD NEPTUNE II (INSTRUMENTS) ×1 IMPLANT
NDL HYPO 22X1.5 SAFETY MO (MISCELLANEOUS) ×1 IMPLANT
NDL INSUFFLATION 14GA 120MM (NEEDLE) ×1 IMPLANT
NEEDLE HYPO 22X1.5 SAFETY MO (MISCELLANEOUS) ×1 IMPLANT
NEEDLE INSUFFLATION 14GA 120MM (NEEDLE) ×1 IMPLANT
NS IRRIG 500ML POUR BTL (IV SOLUTION) ×1 IMPLANT
OBTURATOR OPTICALSTD 8 DVNC (TROCAR) ×1 IMPLANT
PACK LAP CHOLECYSTECTOMY (MISCELLANEOUS) ×1 IMPLANT
SEAL UNIV 5-12 XI (MISCELLANEOUS) ×4 IMPLANT
SET TUBE SMOKE EVAC HIGH FLOW (TUBING) ×1 IMPLANT
SOLUTION ELECTROSURG ANTI STCK (MISCELLANEOUS) ×1 IMPLANT
SPIKE FLUID TRANSFER (MISCELLANEOUS) ×2 IMPLANT
SPONGE DRAIN TRACH 4X4 STRL 2S (GAUZE/BANDAGES/DRESSINGS) IMPLANT
SPONGE T-LAP 4X18 ~~LOC~~+RFID (SPONGE) IMPLANT
SUT VICRYL 0 UR6 27IN ABS (SUTURE) ×1 IMPLANT
SUTURE EHLN 3-0 FS-10 30 BLK (SUTURE) IMPLANT
SUTURE MNCRL 4-0 27XMF (SUTURE) ×1 IMPLANT
SYSTEM BAG RETRIEVAL 10MM (BASKET) ×1 IMPLANT
WATER STERILE IRR 500ML POUR (IV SOLUTION) ×1 IMPLANT

## 2023-12-03 NOTE — Plan of Care (Signed)
  Problem: Education: Goal: Knowledge of General Education information will improve Description: Including pain rating scale, medication(s)/side effects and non-pharmacologic comfort measures Outcome: Progressing   Problem: Health Behavior/Discharge Planning: Goal: Ability to manage health-related needs will improve Outcome: Progressing   Problem: Clinical Measurements: Goal: Ability to maintain clinical measurements within normal limits will improve Outcome: Progressing Goal: Will remain free from infection Outcome: Progressing Goal: Diagnostic test results will improve Outcome: Progressing Goal: Respiratory complications will improve Outcome: Progressing Goal: Cardiovascular complication will be avoided Outcome: Progressing   Problem: Coping: Goal: Level of anxiety will decrease Outcome: Progressing   Problem: Nutrition: Goal: Adequate nutrition will be maintained Outcome: Progressing   Problem: Elimination: Goal: Will not experience complications related to bowel motility Outcome: Progressing Goal: Will not experience complications related to urinary retention Outcome: Progressing   Problem: Skin Integrity: Goal: Risk for impaired skin integrity will decrease Outcome: Progressing   Problem: Safety: Goal: Ability to remain free from injury will improve Outcome: Progressing   Problem: Education: Goal: Ability to describe self-care measures that may prevent or decrease complications (Diabetes Survival Skills Education) will improve Outcome: Progressing Goal: Individualized Educational Video(s) Outcome: Progressing   Problem: Coping: Goal: Ability to adjust to condition or change in health will improve Outcome: Progressing

## 2023-12-03 NOTE — Transfer of Care (Signed)
 Immediate Anesthesia Transfer of Care Note  Patient: Kristen Wong  Procedure(s) Performed: SUBTOTAL CHOLECYSTECTOMY, ROBOT-ASSISTED, LAPAROSCOPIC (Abdomen)  Patient Location: PACU  Anesthesia Type:General  Level of Consciousness: drowsy  Airway & Oxygen Therapy: Patient Spontanous Breathing and Patient connected to face mask oxygen  Post-op Assessment: Report given to RN and Post -op Vital signs reviewed and stable  Post vital signs: Reviewed and stable  Last Vitals:  Vitals Value Taken Time  BP 128/68 12/03/23 14:47  Temp    Pulse 93 12/03/23 14:51  Resp 22 12/03/23 14:51  SpO2 96 % 12/03/23 14:51  Vitals shown include unfiled device data.  Last Pain:  Vitals:   12/03/23 1130  TempSrc: Temporal  PainSc: 0-No pain         Complications: No notable events documented.

## 2023-12-03 NOTE — Plan of Care (Signed)

## 2023-12-03 NOTE — Anesthesia Preprocedure Evaluation (Signed)
 Anesthesia Evaluation  Patient identified by MRN, date of birth, ID band Patient awake    Reviewed: Allergy & Precautions, NPO status , Patient's Chart, lab work & pertinent test results  History of Anesthesia Complications Negative for: history of anesthetic complications  Airway Mallampati: III  TM Distance: >3 FB Neck ROM: full  Mouth opening: Limited Mouth Opening  Dental  (+) Upper Dentures, Poor Dentition, Dental Advidsory Given, Missing   Pulmonary shortness of breath and with exertion, sleep apnea , COPD,  COPD inhaler, neg recent URI, Current Smoker   Pulmonary exam normal  + decreased breath sounds      Cardiovascular Exercise Tolerance: Good hypertension, Pt. on medications (-) angina (-) Past MI and (-) Cardiac Stents Normal cardiovascular exam(-) dysrhythmias + Valvular Problems/Murmurs  Rhythm:Regular     Neuro/Psych  PSYCHIATRIC DISORDERS  Depression    negative neurological ROS     GI/Hepatic negative GI ROS,GERD  Medicated,,NAFLD   Endo/Other  diabetes, Well Controlled, Type 2, Oral Hypoglycemic Agents    Renal/GU negative Renal ROS  negative genitourinary   Musculoskeletal  (+) Arthritis ,    Abdominal  (+) + obese  Peds negative pediatric ROS (+)  Hematology negative hematology ROS (+) Blood dyscrasia   Anesthesia Other Findings Past Medical History: No date: Arthritis     Comment:  KNEES No date: Blood dyscrasia     Comment:  POLYCYTHEMIA No date: COPD (chronic obstructive pulmonary disease) (HCC) No date: Depression     Comment:  H/O 2018: Diabetes mellitus without complication (HCC)     Comment:  Type 2-NEWLY DX No date: Fatty liver No date: GERD (gastroesophageal reflux disease)     Comment:  OCC No date: Heart murmur     Comment:  ASYMPTOMATIC No date: Hypertension No date: Irritable bowel syndrome No date: Polycythemia  Past Surgical History: No date: ABDOMINAL  HYSTERECTOMY No date: CESAREAN SECTION 08/08/2017: COLONOSCOPY WITH PROPOFOL ; N/A     Comment:  Procedure: COLONOSCOPY WITH PROPOFOL ;  Surgeon: Viktoria Lamar DASEN, MD;  Location: [facility];  Service:               Endoscopy;  Laterality: N/A; 01/12/2021: COLONOSCOPY WITH PROPOFOL ; N/A     Comment:  Procedure: COLONOSCOPY WITH PROPOFOL ;  Surgeon:               Maryruth Ole DASEN, MD;  Location: [facility];                Service: Endoscopy;  Laterality: N/A;  DM 05/11/2021: COLONOSCOPY WITH PROPOFOL ; N/A     Comment:  Procedure: COLONOSCOPY WITH PROPOFOL ;  Surgeon:               Maryruth Ole DASEN, MD;  Location: [facility] END[facility];                Service: Endoscopy;  Laterality: N/A;  DM No date: DIAGNOSTIC LAPAROSCOPY 08/08/2017: ESOPHAGOGASTRODUODENOSCOPY (EGD) WITH PROPOFOL ; N/A     Comment:  Procedure: ESOPHAGOGASTRODUODENOSCOPY (EGD) WITH               PROPOFOL ;  Surgeon: Viktoria Lamar DASEN, MD;  Location:               [facility] ENDOSCOPY;  Service: Endoscopy;  Laterality: N/A; No date: KNEE SURGERY     Comment:  [facility] 01/22/2017: LAPAROSCOPIC HYSTERECTOMY; Bilateral     Comment:  Procedure: HYSTERECTOMY TOTAL LAPAROSCOPIC BSO;  Surgeon: Lake Read, MD;  Location: [facility] ORS;                Service: Gynecology;  Laterality: Bilateral;  BMI    Body Mass Index: 38.79 kg/m      Reproductive/Obstetrics negative OB ROS                             Anesthesia Physical Anesthesia Plan  ASA: 3  Anesthesia Plan: General   Post-op Pain Management:    Induction: Intravenous  PONV Risk Score and Plan: Ondansetron , Dexamethasone , Midazolam  and Treatment may vary due to age or medical condition  Airway Management Planned: Oral ETT  Additional Equipment:   Intra-op Plan:   Post-operative Plan: Extubation in OR  Informed Consent: I have reviewed the patients History and Physical, chart, labs and discussed the procedure  including the risks, benefits and alternatives for the proposed anesthesia with the patient or authorized representative who has indicated his/her understanding and acceptance.     Dental Advisory Given  Plan Discussed with: CRNA and Surgeon  Anesthesia Plan Comments:         Anesthesia Quick Evaluation

## 2023-12-03 NOTE — Progress Notes (Signed)
 Patient blood glucose fingerstick reads 319. Called Dr. Dario, verbal order given for 15 units of novolog .

## 2023-12-03 NOTE — H&P (Signed)
 No changes to below H and P, proceed with robotic cholecystectomy as planned.   CC: Biliary Colic History of Present Illness Kristen Wong is a 58 y.o. female with .  Past medical history significant for COPD who presents in consultation for biliary colic.  The patient reports that she has intermittent right upper quadrant pain.  She said this is also associated with nausea.  She says that sometimes it does get relieved with using the bathroom.  She denies any episodes of emesis, fevers or chills when she has the pain.  She does not think that it has any temporal relationship with food although it does last for a little bit and then spontaneously goes away.  She had an ultrasound that I personally reviewed that showed a 3 cm stone within the gallbladder lumen.  She does have a history of COPD and has seen her pulmonologist who has not advised any additional interventions or testing prior to surgical intervention.  She has a abdominal surgical history significant for C-section and a hysterectomy.   Past Medical History     Past Medical History:  Diagnosis Date   Arthritis      KNEES   Atypical chest pain 07/28/2023   Blood dyscrasia      POLYCYTHEMIA   COPD (chronic obstructive pulmonary disease) (HCC)     Depression      H/O   Diabetes mellitus without complication (HCC) 2018    Type 2-NEWLY DX   Fatty liver     Fatty liver     GERD (gastroesophageal reflux disease)      OCC   Heart murmur      ASYMPTOMATIC   Hypertension     Irritable bowel syndrome     Polycythemia     Vitamin B 12 deficiency 07/13/2023                 Past Surgical History:  Procedure Laterality Date   ABDOMINAL HYSTERECTOMY       CESAREAN SECTION       COLONOSCOPY WITH PROPOFOL  N/A 08/08/2017    Procedure: COLONOSCOPY WITH PROPOFOL ;  Surgeon: Viktoria Lamar DASEN, MD;  Location: [facility] ENDOSCOPY;  Service: Endoscopy;  Laterality: N/A;   COLONOSCOPY WITH PROPOFOL  N/A 01/12/2021    Procedure: COLONOSCOPY WITH  PROPOFOL ;  Surgeon: Maryruth Ole DASEN, MD;  Location: [facility] ENDOSCOPY;  Service: Endoscopy;  Laterality: N/A;  DM   COLONOSCOPY WITH PROPOFOL  N/A 05/11/2021    Procedure: COLONOSCOPY WITH PROPOFOL ;  Surgeon: Maryruth Ole DASEN, MD;  Location: [facility] ENDOSCOPY;  Service: Endoscopy;  Laterality: N/A;  DM   COLONOSCOPY WITH PROPOFOL  N/A 02/08/2022    Procedure: COLONOSCOPY WITH PROPOFOL ;  Surgeon: Maryruth Ole DASEN, MD;  Location: [facility] ENDOSCOPY;  Service: Endoscopy;  Laterality: N/A;   DIAGNOSTIC LAPAROSCOPY       ESOPHAGOGASTRODUODENOSCOPY (EGD) WITH PROPOFOL  N/A 08/08/2017    Procedure: ESOPHAGOGASTRODUODENOSCOPY (EGD) WITH PROPOFOL ;  Surgeon: Viktoria Lamar DASEN, MD;  Location: [facility] ENDOSCOPY;  Service: Endoscopy;  Laterality: N/A;   KNEE SURGERY        [facility]   LAPAROSCOPIC HYSTERECTOMY Bilateral 01/22/2017    Procedure: HYSTERECTOMY TOTAL LAPAROSCOPIC BSO;  Surgeon: Lake Read, MD;  Location: [facility] ORS;  Service: Gynecology;  Laterality: Bilateral;          Allergies       Allergies  Allergen Reactions   Penicillin G Nausea Only      Has patient had a PCN reaction causing immediate rash, facial/tongue/throat swelling, SOB or lightheadedness  with hypotension: No Has patient had a PCN reaction causing severe rash involving mucus membranes or skin necrosis: No Has patient had a PCN reaction that required hospitalization: No Has patient had a PCN reaction occurring within the last 10 years: No If all of the above answers are NO, then may proceed with Cephalosporin use.                Current Outpatient Medications  Medication Sig Dispense Refill   albuterol  (VENTOLIN  HFA) 108 (90 Base) MCG/ACT inhaler Inhale 1-2 puffs into the lungs every 6 (six) hours as needed. 8.7 g 3   aspirin EC 81 MG tablet Take 81 mg by mouth daily. Swallow whole.       atorvastatin (LIPITOR) 20 MG tablet Take 20 mg by mouth daily.       busPIRone (BUSPAR) 7.5 MG tablet Take 7.5 mg by mouth 2 (two) times  daily.       carvedilol  (COREG ) 3.125 MG tablet TAKE 1 TABLET BY MOUTH TWICE A DAY WITH A MEAL 60 tablet 11   cetirizine (ZYRTEC) 10 MG tablet Take 20 mg by mouth daily.       etodolac (LODINE) 400 MG tablet Take 400 mg by mouth 2 (two) times daily.       fluticasone (FLONASE) 50 MCG/ACT nasal spray Place 2 sprays into both nostrils daily.       ibuprofen  (ADVIL ,MOTRIN ) 600 MG tablet Take 1 tablet (600 mg total) by mouth every 6 (six) hours as needed for headache, moderate pain or cramping. 60 tablet 1   ketoconazole  (NIZORAL ) 2 % shampoo APPLY 1 APPLICATION TOPICALLY 2 (TWO) TIMES A WEEK. 120 mL 0   metFORMIN  (GLUCOPHAGE ) 500 MG tablet Take 2 tablets (1,000 mg total) by mouth 2 (two) times daily with a meal. 60 tablet 1   naproxen (NAPROSYN) 500 MG tablet Take 500 mg by mouth 2 (two) times daily.       omeprazole (PRILOSEC) 40 MG capsule TAKE 1 CAPSULE BY MOUTH ONCE DAILY 30 MINUTES BEFORE BREAKFAST       PARoxetine (PAXIL) 10 MG tablet TAKE 1 & 1 2 (ONE & ONE HALF) TABLETS BY MOUTH ONCE DAILY       polyethylene glycol powder (MIRALAX ) 17 GM/SCOOP powder Take 17 g by mouth daily. 510 g 2   traZODone  (DESYREL ) 50 MG tablet Take 1 tablet (50 mg total) by mouth at bedtime. 30 tablet 1   Vitamin D , Ergocalciferol , (DRISDOL ) 1.25 MG (50000 UNIT) CAPS capsule Take 1 capsule (50,000 Units total) by mouth every 7 (seven) days. 12 capsule 1   dapagliflozin  propanediol (FARXIGA ) 10 MG TABS tablet Take 1 tablet (10 mg total) by mouth daily before breakfast. (Patient not taking: Reported on 11/27/2023) 30 tablet 11      No current facility-administered medications for this visit.        Family History      Family History  Problem Relation Age of Onset   Diabetes Mother     Emphysema Mother     Hypertension Father     Diabetes Father     Diabetes Sister     Breast cancer Neg Hx              Social History Social History  Social History         Tobacco Use   Smoking status: Every Day       Current packs/day: 2.00      Average packs/day: 2.0 packs/day for 30.0 years (  60.0 ttl pk-yrs)      Types: Cigarettes      Passive exposure: Past   Smokeless tobacco: Never  Vaping Use   Vaping status: Never Used  Substance Use Topics   Alcohol use: No   Drug use: No            ROS Full ROS of systems performed and is otherwise negative there than what is stated in the HPI   Physical Exam Blood pressure (!) 160/80, pulse 82, height 5' 4 (1.626 m), weight 211 lb (95.7 kg), SpO2 94%.   Alert and oriented x 3, normal work of breathing on room air, regular rate and rhythm, abdomen is obese, soft, mild tenderness in the right upper quadrant without rebound tenderness or guarding, well-healed surgical scars on her lower abdomen.   Data Reviewed I have independently reviewed her last labs.  She had a normal total bilirubin.  Her last A1c was 7.5.  I reviewed her ultrasound that showed a gallbladder stone that was approximately 3 cm.   I have personally reviewed the patient's imaging and medical records.     Assessment Assessment 58 year old woman with COPD who presents with likely biliary colic pain.  She also has a significant stone within the gallbladder wall lumen but no evidence of cholecystitis on ultrasound.   Plan Plan I recommended that she undergo cholecystectomy.  I discussed the risk, benefits alternatives of the procedure including risk of infection, bleeding, need for open procedure, bile leak, retained stone and injury to the common bile duct.  I discussed with her that she has an increased risk given that she is an active smoker and has a history of COPD.  I also discussed the risk of pulmonary complications.  She understands these risks and wishes to proceed.  She will stop her aspirin today.  Will plan for next Wednesday.  We will use ICG       Jayson MALVA Endow 11/27/2023, 1:30 PM

## 2023-12-03 NOTE — Anesthesia Procedure Notes (Signed)
 Procedure Name: Intubation Date/Time: 12/03/2023 12:56 PM  Performed by: Niki Manus SAUNDERS, CRNAPre-anesthesia Checklist: Patient identified, Patient being monitored, Timeout performed, Emergency Drugs available and Suction available Patient Re-evaluated:Patient Re-evaluated prior to induction Oxygen Delivery Method: Circle system utilized Preoxygenation: Pre-oxygenation with 100% oxygen Induction Type: IV induction Ventilation: Mask ventilation without difficulty Laryngoscope Size: McGrath and 3 Grade View: Grade I Tube type: Oral Tube size: 7.0 mm Number of attempts: 1 Airway Equipment and Method: Stylet Placement Confirmation: ETT inserted through vocal cords under direct vision, positive ETCO2 and breath sounds checked- equal and bilateral Secured at: 21 cm Tube secured with: Tape Dental Injury: Teeth and Oropharynx as per pre-operative assessment

## 2023-12-03 NOTE — Op Note (Signed)
 Robotic assisted laparoscopic Cholecystectomy  Pre-operative Diagnosis: Biliary Colic  Post-operative Diagnosis: Acute gangrenous on chronic cholecystitis  Procedure:  Robotic assisted laparoscopic Cholecystectomy  Surgeon: Jayson Endow, MD  Anesthesia: Gen. with endotracheal tube  Findings: Nodular liver consistent with cirrhosis, necrotic appearing gallbladder that fell apart with gentle retraction, large stone embedded at neck of gallbladder, decision was made to perform a subtotal cholecystectomy and drain placed in the right upper quadrant with plans for evaluation by GI for ERCP postoperatively  Estimated Blood Loss: 20cc       Specimens: Gallbladder           Complications: none   Procedure Details  The patient was seen again in the Holding Room. The benefits, complications, treatment options, and expected outcomes were discussed with the patient. The risks of bleeding, infection, recurrence of symptoms, failure to resolve symptoms, bile duct damage, bile duct leak, retained common bile duct stone, bowel injury, any of which could require further surgery and/or ERCP, stent, or papillotomy were reviewed with the patient. The likelihood of improving the patient's symptoms with return to their baseline status is good.  The patient and/or family concurred with the proposed plan, giving informed consent.  The patient was taken to Operating Room, identified  and the procedure verified as robotic Cholecystectomy.  A Time Out was held and the above information confirmed.  Prior to the induction of general anesthesia, antibiotic prophylaxis was administered. VTE prophylaxis was in place. General endotracheal anesthesia was then administered and tolerated well. After the induction, the abdomen was prepped with Chloraprep and draped in the sterile fashion. The patient was positioned in the supine position.  A veress needle was inserted into the abdomen using standard drop technique. An 8mm  infra-umbilical robotic port was then placed under direct visualization. There was no injury noted at the site of veress needle insertion. Two right sided abdominal 8mm ports followed by an 12mm left abdominal robotic ports were placed under direct visualization.   The patient was positioned  in reverse Trendelenburg, robot was brought to the surgical field and docked in the standard fashion.  We made sure all the instrumentation was kept indirect view at all times and that there were no collision between the arms. I scrubbed out and went to the console.  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  The fundus of the gallbladder was grasped and upon gentle manipulation of this a perforation was made with output of seropurulent bile.  I was able to retract the fundus over the dome of the liver.  I then tried to retract the infundibulum and the gallbladder essentially fell apart at the infundibulum with spillage of a large stone.  The stone was consistent with ultrasound finding of a stone lodged at the neck of the gallbladder.  I then tried to grasp the infundibulum warm however the cystic duct was quite inflamed and the gallbladder was just falling apart with gentle retraction.  At the site of perforation at the infundibulum I took the gallbladder off of the hepatic plate with Bovie cautery.  I then tried to dissect out the cystic duct but I was afraid that given the degree of inflammation and that the gallbladder was extraordinarily friable that I would injure the common bile duct.  At this time I elected to perform a subtotal cholecystectomy.  I had already excised the body and fundus of the gallbladder from the hepatic plate.  I then used Bovie cautery to burn the back  wall of the hepatic plate and remaining gallbladder.  There was not a good view of the cystic duct orifice so I did not place a stitch into this.  The gallbladder wall remnants and the large stone  were then placed in the Endo Catch bag.  The right upper quadrant was then copiously irrigated with warm saline solution.  All hemostasis was ensured.  There is obvious spillage of bile.  Given that I had to perform a subtotal cholecystectomy that was fenestrated I elected to place a drain in the right upper quadrant with the plan for GI consult and ERCP tomorrow..  A 19 [place] Blake drain was placed through the right most robotic port and positioned in the gallbladder fossa.  I also placed a piece of Surgicel over the gallbladder fossa to ensure hemostasis.  The robotic platform was then undocked and the gallbladder was removed through the left lower quadrant port.  The left lower quadrant port fascia was closed with 0 Vicryl on a suture needle passer.  The drain was secured with a 3-0 nylon.  The abdomen was then desufflated and the skin was closed with 4-0 Monocryl and dressed with glue.  The drain was dressed with a drain sponge.  Prior to termination of the procedure all sponge instrument counts correct x 2.  The patient was then awoken from general endotracheal anesthesia and transferred the PACU in good condition      Jayson Endow, M.D.  [facility]

## 2023-12-03 NOTE — Progress Notes (Signed)
 Jp drain became occluded, unable to hold suction, had a clot in bulb that was getting stuck in the valve. Dr. Marinda at bedside to change bulb.

## 2023-12-04 ENCOUNTER — Telehealth: Payer: Self-pay

## 2023-12-04 ENCOUNTER — Inpatient Hospital Stay: Admitting: Anesthesiology

## 2023-12-04 ENCOUNTER — Inpatient Hospital Stay

## 2023-12-04 ENCOUNTER — Encounter: Payer: Self-pay | Admitting: General Surgery

## 2023-12-04 ENCOUNTER — Encounter
Admission: AD | Disposition: A | Discharge status: Home / Self Care | Payer: Self-pay | Source: Home / Self Care | Attending: General Surgery

## 2023-12-04 DIAGNOSIS — K838 Other specified diseases of biliary tract: Secondary | ICD-10-CM

## 2023-12-04 DIAGNOSIS — K839 Disease of biliary tract, unspecified: Secondary | ICD-10-CM

## 2023-12-04 HISTORY — DX: Other specified diseases of biliary tract: K83.8

## 2023-12-04 HISTORY — PX: BILIARY STENT PLACEMENT: SHX5538

## 2023-12-04 HISTORY — PX: ERCP: SHX5425

## 2023-12-04 LAB — CBC
HCT: 51.4 % — ABNORMAL HIGH (ref 36.0–46.0)
Hemoglobin: 17.3 g/dL — ABNORMAL HIGH (ref 12.0–15.0)
MCH: 29.6 pg (ref 26.0–34.0)
MCHC: 33.7 g/dL (ref 30.0–36.0)
MCV: 87.9 fL (ref 80.0–100.0)
Platelets: 253 10*3/uL (ref 150–400)
RBC: 5.85 MIL/uL — ABNORMAL HIGH (ref 3.87–5.11)
RDW: 13.1 % (ref 11.5–15.5)
WBC: 16.4 10*3/uL — ABNORMAL HIGH (ref 4.0–10.5)
nRBC: 0 % (ref 0.0–0.2)

## 2023-12-04 LAB — GLUCOSE, CAPILLARY
Glucose-Capillary: 394 mg/dL — ABNORMAL HIGH (ref 70–99)
Glucose-Capillary: 354 mg/dL — ABNORMAL HIGH (ref 70–99)
Glucose-Capillary: 343 mg/dL — ABNORMAL HIGH (ref 70–99)
Glucose-Capillary: 468 mg/dL — ABNORMAL HIGH (ref 70–99)

## 2023-12-04 LAB — BASIC METABOLIC PANEL WITH GFR
Anion gap: 7 (ref 5–15)
BUN: 12 mg/dL (ref 6–20)
CO2: 24 mmol/L (ref 22–32)
Calcium: 8.6 mg/dL — ABNORMAL LOW (ref 8.9–10.3)
Chloride: 101 mmol/L (ref 98–111)
Creatinine, Ser: 0.4 mg/dL — ABNORMAL LOW (ref 0.44–1.00)
GFR, Estimated: >60 mL/min (ref >60)
Glucose, Bld: 342 mg/dL — ABNORMAL HIGH (ref 70–99)
Potassium: 3.9 mmol/L (ref 3.5–5.1)
Sodium: 132 mmol/L — ABNORMAL LOW (ref 135–145)

## 2023-12-04 LAB — SURGICAL PATHOLOGY

## 2023-12-04 LAB — GLUCOSE, RANDOM: Glucose, Bld: 472 mg/dL — ABNORMAL HIGH (ref 70–99)

## 2023-12-04 SURGERY — ERCP, WITH INTERVENTION IF INDICATED
Anesthesia: General

## 2023-12-04 MED ORDER — INSULIN ASPART 100 UNIT/ML IJ SOLN
0.0000 [IU] | Freq: Three times a day (TID) | INTRAMUSCULAR | Status: DC
  Administered 2023-12-05: 15 [IU] via SUBCUTANEOUS
  Filled 2023-12-04: qty 1

## 2023-12-04 MED ORDER — MIDAZOLAM HCL 2 MG/2ML IJ SOLN
INTRAMUSCULAR | Status: AC
  Filled 2023-12-04: qty 2

## 2023-12-04 MED ORDER — ONDANSETRON HCL 4 MG/2ML IJ SOLN
INTRAMUSCULAR | Status: AC
  Filled 2023-12-04: qty 2

## 2023-12-04 MED ORDER — DEXMEDETOMIDINE HCL IN NACL 80 MCG/20ML IV SOLN
INTRAVENOUS | Status: DC | PRN
  Administered 2023-12-04: 8 ug via INTRAVENOUS

## 2023-12-04 MED ORDER — DEXAMETHASONE SODIUM PHOSPHATE 10 MG/ML IJ SOLN
INTRAMUSCULAR | Status: DC | PRN
Start: 2023-12-04 — End: 2023-12-04
  Administered 2023-12-04: 4 mg via INTRAVENOUS

## 2023-12-04 MED ORDER — SUCCINYLCHOLINE CHLORIDE 200 MG/10ML IV SOSY
PREFILLED_SYRINGE | INTRAVENOUS | Status: AC
Start: 2023-12-04 — End: 2023-12-04
  Filled 2023-12-04: qty 10

## 2023-12-04 MED ORDER — FENTANYL CITRATE (PF) 100 MCG/2ML IJ SOLN
INTRAMUSCULAR | Status: DC | PRN
  Administered 2023-12-04 (×2): 50 ug via INTRAVENOUS

## 2023-12-04 MED ORDER — ONDANSETRON HCL 4 MG/2ML IJ SOLN
INTRAMUSCULAR | Status: DC | PRN
  Administered 2023-12-04: 4 mg via INTRAVENOUS

## 2023-12-04 MED ORDER — ROCURONIUM BROMIDE 10 MG/ML (PF) SYRINGE
PREFILLED_SYRINGE | INTRAVENOUS | Status: DC | PRN
  Administered 2023-12-04: 20 mg via INTRAVENOUS

## 2023-12-04 MED ORDER — PROPOFOL 1000 MG/100ML IV EMUL
INTRAVENOUS | Status: AC
  Filled 2023-12-04: qty 100

## 2023-12-04 MED ORDER — DICLOFENAC SUPPOSITORY 100 MG: 100.0000 mg | Freq: Once | RECTAL | Status: DC

## 2023-12-04 MED ORDER — ROCURONIUM BROMIDE 10 MG/ML (PF) SYRINGE
PREFILLED_SYRINGE | INTRAVENOUS | Status: AC
  Filled 2023-12-04: qty 10

## 2023-12-04 MED ORDER — DICLOFENAC SUPPOSITORY 100 MG
RECTAL | Status: AC
  Filled 2023-12-04: qty 1

## 2023-12-04 MED ORDER — DEXAMETHASONE SODIUM PHOSPHATE 10 MG/ML IJ SOLN
INTRAMUSCULAR | Status: AC
  Filled 2023-12-04: qty 1

## 2023-12-04 MED ORDER — SUCCINYLCHOLINE CHLORIDE 200 MG/10ML IV SOSY
PREFILLED_SYRINGE | INTRAVENOUS | Status: DC | PRN
Start: 2023-12-04 — End: 2023-12-04
  Administered 2023-12-04: 120 mg via INTRAVENOUS

## 2023-12-04 MED ORDER — FENTANYL CITRATE (PF) 100 MCG/2ML IJ SOLN
INTRAMUSCULAR | Status: AC
  Filled 2023-12-04: qty 2

## 2023-12-04 MED ORDER — GLYCOPYRROLATE 0.2 MG/ML IJ SOLN
INTRAMUSCULAR | Status: AC
  Filled 2023-12-04: qty 1

## 2023-12-04 MED ORDER — INSULIN ASPART 100 UNIT/ML IJ SOLN
0.0000 [IU] | Freq: Every day | INTRAMUSCULAR | Status: DC
  Administered 2023-12-04: 5 [IU] via SUBCUTANEOUS
  Filled 2023-12-04: qty 1

## 2023-12-04 MED ORDER — MIDAZOLAM HCL 2 MG/2ML IJ SOLN
INTRAMUSCULAR | Status: DC | PRN
Start: 2023-12-04 — End: 2023-12-04
  Administered 2023-12-04: 2 mg via INTRAVENOUS

## 2023-12-04 MED ORDER — SUGAMMADEX SODIUM 200 MG/2ML IV SOLN
INTRAVENOUS | Status: DC | PRN
  Administered 2023-12-04: 400 mg via INTRAVENOUS

## 2023-12-04 MED ORDER — LIDOCAINE HCL (CARDIAC) PF 100 MG/5ML IV SOSY
PREFILLED_SYRINGE | INTRAVENOUS | Status: DC | PRN
  Administered 2023-12-04: 60 mg via INTRAVENOUS

## 2023-12-04 MED ORDER — PROPOFOL 10 MG/ML IV BOLUS
INTRAVENOUS | Status: DC | PRN
  Administered 2023-12-04: 30 mg via INTRAVENOUS
  Administered 2023-12-04: 100 mg via INTRAVENOUS
  Administered 2023-12-04: 50 mg via INTRAVENOUS

## 2023-12-04 MED ORDER — DICLOFENAC SUPPOSITORY 100 MG
100.0000 mg | Freq: Once | RECTAL | Status: AC
  Administered 2023-12-04: 100 mg via RECTAL

## 2023-12-04 MED ORDER — GLYCOPYRROLATE 0.2 MG/ML IJ SOLN
INTRAMUSCULAR | Status: DC | PRN
  Administered 2023-12-04: .2 mg via INTRAVENOUS

## 2023-12-04 NOTE — Telephone Encounter (Signed)
 Repeat ERCP 3 months needed

## 2023-12-04 NOTE — Transfer of Care (Signed)
 Immediate Anesthesia Transfer of Care Note  Patient: Kristen Wong  Procedure(s) Performed: ERCP, WITH INTERVENTION IF INDICATED INSERTION, STENT, BILE DUCT  Patient Location: PACU  Anesthesia Type:General  Level of Consciousness: responds to stimulation  Airway & Oxygen Therapy: Patient Spontanous Breathing and Patient connected to face mask oxygen  Post-op Assessment: Report given to RN and Post -op Vital signs reviewed and stable  Post vital signs: stable  Last Vitals:  Vitals Value Taken Time  BP 98/56 12/04/23 11:29  Temp 35.6 C 12/04/23 11:29  Pulse 104 12/04/23 11:31  Resp 21 12/04/23 11:31  SpO2 94 % 12/04/23 11:31  Vitals shown include unfiled device data.  Last Pain:  Vitals:   12/04/23 1029  TempSrc: Temporal  PainSc:       Patients Stated Pain Goal: 0 (12/04/23 1033)  Complications: No notable events documented.

## 2023-12-04 NOTE — Anesthesia Postprocedure Evaluation (Signed)
 Anesthesia Post Note  Patient: Kristen Wong  Procedure(s) Performed: ERCP, WITH INTERVENTION IF INDICATED INSERTION, STENT, BILE DUCT  Patient location during evaluation: PACU Anesthesia Type: General Level of consciousness: awake Pain management: satisfactory to patient Vital Signs Assessment: post-procedure vital signs reviewed and stable Respiratory status: spontaneous breathing Cardiovascular status: stable Anesthetic complications: no   No notable events documented.   Last Vitals:  Vitals:   12/04/23 1159 12/04/23 1235  BP: 112/68   Pulse: (!) 106   Resp: 19 18  Temp:    SpO2: 91%     Last Pain:  Vitals:   12/04/23 1235  TempSrc:   PainSc: 9                  VAN STAVEREN,Johara Lodwick

## 2023-12-04 NOTE — Consult Note (Signed)
 Kristen Copping, MD [facility]  [address] [place], [place] [address] Phone: [phone] Fax : [phone]  Consultation  Referring Provider:     Dr. Marinda Primary Care Physician:  Hope Merle, MD Primary Gastroenterologist:  Dr. Maryruth         Reason for Consultation:     Bile duct leak  Date of Admission:  12/03/2023 Date of Consultation:  12/04/2023         HPI:   Kristen Wong is a 58 y.o. female who is status post robotic assisted laparoscopic subtotal cholecystectomy for gangrenous cholecystitis.  The patient reports that she continues to have drainage from her percutaneous drain and continues to have abdominal pain.  The patient is very clear that she wants to get out of the hospital as soon as possible.  There is no report of any nausea vomiting at the present time.  The patient's white cell count did go up from 3 days ago up to 9.02 today being 16.4.  I am now being asked to see the patient for an ERCP.  Past Medical History:  Diagnosis Date   Arthritis of both knees    Atypical chest pain 07/28/2023   COPD (chronic obstructive pulmonary disease) (HCC)    Depression    GERD (gastroesophageal reflux disease)    Heart murmur (asymptomatic)    Hepatic steatosis    Hypertension    Irritable bowel syndrome    Polycythemia    T2DM (type 2 diabetes mellitus) (HCC) 2018   Vitamin B 12 deficiency 07/13/2023    Past Surgical History:  Procedure Laterality Date   ABDOMINAL HYSTERECTOMY     CESAREAN SECTION     COLONOSCOPY WITH PROPOFOL  N/A 08/08/2017   Procedure: COLONOSCOPY WITH PROPOFOL ;  Surgeon: Viktoria Lamar DASEN, MD;  Location: [facility] ENDOSCOPY;  Service: Endoscopy;  Laterality: N/A;   COLONOSCOPY WITH PROPOFOL  N/A 01/12/2021   Procedure: COLONOSCOPY WITH PROPOFOL ;  Surgeon: Maryruth Ole DASEN, MD;  Location: [facility] ENDOSCOPY;  Service: Endoscopy;  Laterality: N/A;  DM   COLONOSCOPY WITH PROPOFOL  N/A 05/11/2021   Procedure: COLONOSCOPY WITH PROPOFOL ;   Surgeon: Maryruth Ole DASEN, MD;  Location: [facility] ENDOSCOPY;  Service: Endoscopy;  Laterality: N/A;  DM   COLONOSCOPY WITH PROPOFOL  N/A 02/08/2022   Procedure: COLONOSCOPY WITH PROPOFOL ;  Surgeon: Maryruth Ole DASEN, MD;  Location: [facility] ENDOSCOPY;  Service: Endoscopy;  Laterality: N/A;   DIAGNOSTIC LAPAROSCOPY     ESOPHAGOGASTRODUODENOSCOPY (EGD) WITH PROPOFOL  N/A 08/08/2017   Procedure: ESOPHAGOGASTRODUODENOSCOPY (EGD) WITH PROPOFOL ;  Surgeon: Viktoria Lamar DASEN, MD;  Location: [facility] ENDOSCOPY;  Service: Endoscopy;  Laterality: N/A;   KNEE SURGERY     [facility]   LAPAROSCOPIC HYSTERECTOMY Bilateral 01/22/2017   Procedure: HYSTERECTOMY TOTAL LAPAROSCOPIC BSO;  Surgeon: Lake Read, MD;  Location: [facility] ORS;  Service: Gynecology;  Laterality: Bilateral;    Prior to Admission medications   Medication Sig Start Date End Date Taking? Authorizing Provider  albuterol  (VENTOLIN  HFA) 108 (90 Base) MCG/ACT inhaler Inhale 1-2 puffs into the lungs every 6 (six) hours as needed. 07/28/23 07/27/24 Yes Hope Merle, MD  busPIRone (BUSPAR) 7.5 MG tablet Take 7.5 mg by mouth 2 (two) times daily.   Yes [provider]  carvedilol  (COREG ) 3.125 MG tablet TAKE 1 TABLET BY MOUTH TWICE A DAY WITH A MEAL 11/24/23  Yes Hope Merle, MD  cetirizine (ZYRTEC) 10 MG tablet Take 20 mg by mouth daily.   Yes [provider]  etodolac (LODINE) 400 MG  tablet Take 400 mg by mouth 2 (two) times daily.   Yes [provider]  fluticasone (FLONASE) 50 MCG/ACT nasal spray Place 2 sprays into both nostrils daily. 09/04/21  Yes [provider]  ketoconazole  (NIZORAL ) 2 % shampoo APPLY 1 APPLICATION TOPICALLY 2 (TWO) TIMES A WEEK. 11/27/23 12/27/23 Yes Bair, Luke, MD  metFORMIN  (GLUCOPHAGE ) 500 MG tablet Take 2 tablets (1,000 mg total) by mouth 2 (two) times daily with a meal. 10/28/23  Yes Bair, Kalpana, MD  naproxen (NAPROSYN) 500 MG tablet Take 500 mg by mouth 2 (two) times daily. 08/14/23  Yes [provider]  omeprazole (PRILOSEC) 40 MG capsule TAKE 1 CAPSULE BY MOUTH ONCE DAILY 30 MINUTES BEFORE BREAKFAST 02/27/19  Yes [provider]  PARoxetine (PAXIL) 10 MG tablet TAKE 1 & 1 2 (ONE & ONE HALF) TABLETS BY MOUTH ONCE DAILY 03/08/19  Yes [provider]  polyethylene glycol powder (MIRALAX ) 17 GM/SCOOP powder Take 17 g by mouth daily. 08/26/23  Yes Vaillancourt, Samantha, PA-C  traZODone  (DESYREL ) 50 MG tablet Take 1 tablet (50 mg total) by mouth at bedtime. 10/28/23  Yes Bair, Kalpana, MD  aspirin EC 81 MG tablet Take 81 mg by mouth daily. Swallow whole.    [provider]  atorvastatin (LIPITOR) 20 MG tablet Take 20 mg by mouth daily. Patient not taking: Reported on 11/28/2023 04/16/23 04/15/24  [provider]  dapagliflozin  propanediol (FARXIGA ) 10 MG TABS tablet Take 1 tablet (10 mg total) by mouth daily before breakfast. Patient not taking: No sig reported 07/30/23   Hope Merle, MD  ibuprofen  (ADVIL ,MOTRIN ) 600 MG tablet Take 1 tablet (600 mg total) by mouth every 6 (six) hours as needed for headache, moderate pain or cramping. Patient not taking: Reported on 11/28/2023 01/23/17   Lake Read, MD  Vitamin D , Ergocalciferol , (DRISDOL ) 1.25 MG (50000 UNIT) CAPS capsule Take 1 capsule (50,000 Units total) by mouth every 7 (seven) days. 07/13/23   Hope Merle, MD    Family History  Problem Relation Age of Onset   Diabetes Mother    Emphysema Mother    Hypertension Father    Diabetes Father    Diabetes Sister    Breast cancer Neg Hx      Social History   Tobacco Use   Smoking status: Every Day    Current packs/day: 2.00    Average packs/day: 2.0 packs/day for 30.0 years (60.0 ttl pk-yrs)    Types: Cigarettes    Passive exposure: Past   Smokeless tobacco: Never  Vaping Use   Vaping status: Never Used  Substance Use Topics   Alcohol use: No   Drug use: No    Allergies as of 11/27/2023 - Review Complete 11/27/2023  Allergen Reaction  Noted   Penicillin g Nausea Only 04/12/2016    Review of Systems:    All systems reviewed and negative except where noted in HPI.   Physical Exam:  Vital signs in last 24 hours: Temp:  [97.5 F (36.4 C)-98.6 F (37 C)] 97.5 F (36.4 C) (06/26 0751) Pulse Rate:  [80-105] 105 (06/26 0751) Resp:  [16-25] 16 (06/26 0751) BP: (123-168)/(54-92) 166/92 (06/26 0751) SpO2:  [90 %-96 %] 90 % (06/26 0751) Weight:  [95.7 kg] 95.7 kg (06/25 1130)   General:   Pleasant, cooperative in NAD Head:  Normocephalic and atraumatic. Eyes:   No icterus.   Conjunctiva pink. PERRLA. Ears:  Normal auditory acuity. Neck:  Supple; no masses or thyroidomegaly Lungs: Respirations even and unlabored.  Lungs clear to auscultation bilaterally.   No wheezes, crackles, or rhonchi.  Heart:  Regular rate and rhythm;  Without murmur, clicks, rubs or gallops Abdomen:  Soft, nondistended, nontender. Normal bowel sounds. No appreciable masses or hepatomegaly.  No rebound or guarding.  Rectal:  Not performed. Msk:  Symmetrical without gross deformities.    Extremities:  Without edema, cyanosis or clubbing. Neurologic:  Alert and oriented x3;  grossly normal neurologically. Skin:  Intact without significant lesions or rashes. Cervical Nodes:  No significant cervical adenopathy. Psych:  Alert and cooperative. Normal affect.  LAB RESULTS: Recent Labs    12/01/23 1410 12/04/23 0427  WBC 9.0 16.4*  HGB 16.6* 17.3*  HCT 48.2* 51.4*  PLT 201 253   BMET Recent Labs    12/01/23 1410 12/04/23 0427  NA 132* 132*  K 3.7 3.9  CL 99 101  CO2 25 24  GLUCOSE 373* 342*  BUN 7 12  CREATININE 0.48 0.40*  CALCIUM 8.8* 8.6*   LFT Recent Labs    12/01/23 1410  PROT 7.3  ALBUMIN 3.5  AST 130*  ALT 129*  ALKPHOS 96  BILITOT 0.7   PT/INR No results for input(s): LABPROT, INR in the last 72 hours.  STUDIES: No results found.    Impression / Plan:   Assessment: Principal Problem:   Acute  cholecystitis   AVY BARLETT is a 58 y.o. y/o female with a subtotal cholecystectomy yesterday with drainage from the percutaneous drain.  The patient is being consulted for an ERCP with a stent placement.  Plan:  The patient will be set up for an ERCP for today.  The patient has been explained the risks benefits and alternatives.  She has been told about the risk of pancreatitis, bleeding perforation and death.  She states that she understands and has been given opportunity to ask questions and questions were answered.  The patient will have her ERCP done today.  Thank you for involving me in the care of this patient.      LOS: 1 day   Kristen Copping, MD, MD. [facility] 12/04/2023, 8:53 AM,  Pager [phone] 7am-5pm  Check AMION for 5pm -7am coverage and on weekends   Note: This dictation was prepared with Dragon dictation along with smaller phrase technology. Any transcriptional errors that result from this process are unintentional.

## 2023-12-04 NOTE — Progress Notes (Addendum)
 JP drain continues to have a large amount of drainage around the incision site. Dressing changed and reinforced 4 times during my shift.

## 2023-12-04 NOTE — Inpatient Diabetes Management (Signed)
 Inpatient Diabetes Program Recommendations  AACE/ADA: New Consensus Statement on Inpatient Glycemic Control (2015)  Target Ranges:  Prepandial:   less than 140 mg/dL      Peak postprandial:   less than 180 mg/dL (1-2 hours)      Critically ill patients:  140 - 180 mg/dL    Latest Reference Range & Units 12/03/23 11:30 12/03/23 14:48  Glucose-Capillary 70 - 99 mg/dL 720 (H)  5 mg Decadron  @1305  319 (H)  15 units Novolog    (H): Data is abnormally high     Admit biliary colic pain/ significant stone within the gallbladder   Home DM Meds: Metformin  1000 mg BID + Farxiga  10 mg daily (NOT taking)   Current Orders: Novolog  0-15 units TID     Underwent cholecystectomy yest afternoon Received Decadron  X 1 dose yest at 1pm Novolog  SSI to start this AM Will follow    --Will follow patient during hospitalization--  Adina Rudolpho Arrow RN, MSN, CDCES Diabetes Coordinator Inpatient Glycemic Control Team Team Pager: [phone] (8a-5p)

## 2023-12-04 NOTE — Anesthesia Procedure Notes (Signed)
 Procedure Name: Intubation Date/Time: 12/04/2023 10:50 AM  Performed by: Myra Lawless, CRNAPre-anesthesia Checklist: Patient identified, Patient being monitored, Timeout performed, Emergency Drugs available and Suction available Patient Re-evaluated:Patient Re-evaluated prior to induction Oxygen Delivery Method: Circle system utilized Preoxygenation: Pre-oxygenation with 100% oxygen Induction Type: IV induction Ventilation: Mask ventilation without difficulty Laryngoscope Size: Glidescope and 4 Grade View: Grade I Tube type: Oral Tube size: 7.0 mm Number of attempts: 1 Airway Equipment and Method: Stylet Placement Confirmation: ETT inserted through vocal cords under direct vision, positive ETCO2 and breath sounds checked- equal and bilateral Secured at: 21 cm Tube secured with: Tape Dental Injury: Teeth and Oropharynx as per pre-operative assessment  Comments: Anesthesiologist and CRNA present and readily available to assist SRNA.

## 2023-12-04 NOTE — Op Note (Signed)
 [facility] Gastroenterology Patient Name: Kristen Wong Procedure Date: 12/04/2023 10:12 AM MRN: [id number] Account #: [id number] Date of Birth: [birth year] Admit Type: Inpatient Age: 58 Room: [facility] ENDO ROOM 4 Gender: Female Note Status: Finalized Instrument Name: [id number] Procedure:             ERCP Indications:           Bile leak Providers:             Rogelia Copping MD, MD Medicines:             General Anesthesia Complications:         No immediate complications. Procedure:             Pre-Anesthesia Assessment:                        - Prior to the procedure, a History and Physical was                         performed, and patient medications and allergies were                         reviewed. The patient's tolerance of previous                         anesthesia was also reviewed. The risks and benefits                         of the procedure and the sedation options and risks                         were discussed with the patient. All questions were                         answered, and informed consent was obtained. Prior                         Anticoagulants: The patient has taken no anticoagulant                         or antiplatelet agents. ASA Grade Assessment: II - A                         patient with mild systemic disease. After reviewing                         the risks and benefits, the patient was deemed in                         satisfactory condition to undergo the procedure.                        After obtaining informed consent, the scope was passed                         under direct vision. Throughout the procedure, the                         patient's blood pressure, pulse, and oxygen  saturations were monitored continuously. The                         Duodenoscope was introduced through the mouth, and                         used to inject contrast into and used to inject                          contrast into the bile duct. The ERCP was accomplished                         without difficulty. The patient tolerated the                         procedure well. Findings:      A scout film of the abdomen was obtained. One percutaneous drain ending       in the Subhepatic space was seen. The esophagus was successfully       intubated under direct vision. The scope was advanced to a normal major       papilla in the descending duodenum without detailed examination of the       pharynx, larynx and associated structures, and upper GI tract. The upper       GI tract was grossly normal. The bile duct was deeply cannulated with       the short-nosed traction sphincterotome. Contrast was injected. I       personally interpreted the bile duct images. There was brisk flow of       contrast through the ducts. Image quality was excellent. Contrast       extended to the entire biliary tree. Extravasation of contrast       originating from the cystic duct was observed. A wire was passed into       the biliary tree. A 6 mm biliary sphincterotomy was made with a traction       (standard) sphincterotome using ERBE electrocautery. There was no       post-sphincterotomy bleeding. One 10 Fr by 9 cm plastic stent with a       single external flap and a single internal flap was placed 8 cm into the       common bile duct. Bile flowed through the stent. The stent was in good       position. Impression:            - A bile leak was found.                        - A biliary sphincterotomy was performed.                        - One plastic stent was placed into the common bile                         duct. Recommendation:        - Return patient to hospital ward for ongoing care.                        - Clear liquid diet today.                        -  Watch for pancreatitis, bleeding, perforation, and                         cholangitis.                        - Repeat ERCP in 3 months to remove  stent. Procedure Code(s):     --- Professional ---                        [id number], Endoscopic retrograde cholangiopancreatography                         (ERCP); with placement of endoscopic stent into                         biliary or pancreatic duct, including pre- and                         post-dilation and guide wire passage, when performed,                         including sphincterotomy, when performed, each stent                        25671, Endoscopic catheterization of the biliary                         ductal system, radiological supervision and                         interpretation Diagnosis Code(s):     --- Professional ---                        K83.9, Disease of biliary tract, unspecified CPT copyright 2022 American Medical Association. All rights reserved. The codes documented in this report are preliminary and upon coder review may  be revised to meet current compliance requirements. Rogelia Copping MD, MD 12/04/2023 11:14:49 AM This report has been signed electronically. Number of Addenda: 0 Note Initiated On: 12/04/2023 10:12 AM Estimated Blood Loss:  Estimated blood loss: none.      [facility]

## 2023-12-04 NOTE — Care Plan (Signed)
 Patient s/p procedure Tolerated well  Report phoned to RN Patient aware of report per MD

## 2023-12-04 NOTE — Progress Notes (Signed)
 [facility] SURGICAL PROGRESS NOTE  Hospital Day(s): 1.   Post op day(s): 1 Day Post-Op.   Interval History:  Patient seen and examined No acute events or new complaints overnight.  Patient reports she is quite sore expectedly No fever, chills, nausea, emesis  Leukocytosis to 16.4K Hgb to 17.3 Renal function normal; sCr - 0.40; UO - unmeasured x4 Mild hyponatremia to 132 Drain with 410 ccs out; mix of serous and bilious fluid  She is NPO GI aware and plan for ERCP    Vital signs in last 24 hours: [min-max] current  Temp:  [97.5 F (36.4 C)-98.6 F (37 C)] 97.5 F (36.4 C) (06/26 0751) Pulse Rate:  [80-105] 105 (06/26 0751) Resp:  [16-25] 16 (06/26 0751) BP: (123-168)/(54-92) 166/92 (06/26 0751) SpO2:  [90 %-96 %] 90 % (06/26 0751) Weight:  [95.7 kg] 95.7 kg (06/25 1130)     Height: 5' 4 (162.6 cm) Weight: 95.7 kg BMI (Calculated): 36.2   Intake/Output last 2 shifts:  06/25 0701 - 06/26 0700 In: 1540 [P.O.:240; I.V.:1300] Out: 410 [Drains:410]   Physical Exam:  Constitutional: alert, cooperative and no distress  Respiratory: breathing non-labored at rest  Cardiovascular: regular rate and sinus rhythm  Gastrointestinal: soft, expected tenderness, and non-distended. No rebound/guarding. Surgical drain in right lateral port site, there is mix of bilious and serous fluid, this is also leaking around the drain itself Integumentary: Laparoscopic incisions are CDI with dermabond, no erythema   Labs:     Latest Ref Rng & Units 12/04/2023    4:27 AM 12/01/2023    2:10 PM 07/01/2023   11:23 AM  CBC  WBC 4.0 - 10.5 K/uL 16.4  9.0  8.3   Hemoglobin 12.0 - 15.0 g/dL 82.6  83.3  84.1   Hematocrit 36.0 - 46.0 % 51.4  48.2  46.9   Platelets 150 - 400 K/uL 253  201  261.0       Latest Ref Rng & Units 12/04/2023    4:27 AM 12/01/2023    2:10 PM 08/12/2023    9:10 AM  CMP  Glucose 70 - 99 mg/dL 657  626  832   BUN 6 - 20 mg/dL 12  7  10    Creatinine 0.44 - 1.00  mg/dL 9.59  9.51  9.51   Sodium 135 - 145 mmol/L 132  132  139   Potassium 3.5 - 5.1 mmol/L 3.9  3.7  4.2   Chloride 98 - 111 mmol/L 101  99  101   CO2 22 - 32 mmol/L 24  25  30    Calcium 8.9 - 10.3 mg/dL 8.6  8.8  9.6   Total Protein 6.5 - 8.1 g/dL  7.3  7.8   Total Bilirubin 0.0 - 1.2 mg/dL  0.7  0.6   Alkaline Phos 38 - 126 U/L  96  100   AST 15 - 41 U/L  130  157   ALT 0 - 44 U/L  129  176      Imaging studies: No new pertinent imaging studies   Assessment/Plan:  58 y.o. female 1 Day Post-Op s/p robotic assisted laparoscopic subtotal cholecystectomy for severe gangrenous cholecystitis with markedly friable gallbladder intra-operatively   - Appreciate GI assistance; plan for ERCP today - NPO for planned procedure - Continue drain; monitor and record output. Hopefully with ERCP this will dry up. Will need to reinforce dressing as needed. If there continues to be leaking issues we can also reinforce with sutures. Will  monitor response after ERCP   - Continue IV Abx (Zosyn) - Monitor abdominal examination   - Monitor leukocytosis - Pain control prn; antiemetics prn    All of the above findings and recommendations were discussed with the patient, and the medical team, and all of patient's questions were answered to her expressed satisfaction.  -- Arthea Platt, PA-C  [facility] 12/04/2023, 8:01 AM M-F: 7am - 4pm

## 2023-12-04 NOTE — Anesthesia Preprocedure Evaluation (Signed)
 Anesthesia Evaluation  Patient identified by MRN, date of birth, ID band Patient awake    Reviewed: Allergy & Precautions, NPO status , Patient's Chart, lab work & pertinent test results  Airway Mallampati: III  TM Distance: >3 FB Neck ROM: full    Dental  (+) Edentulous Upper, Missing, Poor Dentition   Pulmonary neg pulmonary ROS, COPD,  COPD inhaler, Current Smoker and Patient abstained from smoking.   Pulmonary exam normal  + decreased breath sounds      Cardiovascular Exercise Tolerance: Poor hypertension, Pt. on medications negative cardio ROS Normal cardiovascular exam Rate:Normal     Neuro/Psych    Depression    negative neurological ROS  negative psych ROS   GI/Hepatic negative GI ROS, Neg liver ROS,GERD  Medicated,,  Endo/Other  negative endocrine ROSdiabetes, Type 2, Oral Hypoglycemic Agents  Class 3 obesity  Renal/GU negative Renal ROS  negative genitourinary   Musculoskeletal   Abdominal  (+) + obese  Peds negative pediatric ROS (+)  Hematology negative hematology ROS (+)   Anesthesia Other Findings Past Medical History: No date: Arthritis of both knees 07/28/2023: Atypical chest pain No date: COPD (chronic obstructive pulmonary disease) (HCC) No date: Depression No date: GERD (gastroesophageal reflux disease) No date: Heart murmur (asymptomatic) No date: Hepatic steatosis No date: Hypertension No date: Irritable bowel syndrome No date: Polycythemia 2018: T2DM (type 2 diabetes mellitus) (HCC) 07/13/2023: Vitamin B 12 deficiency  Past Surgical History: No date: ABDOMINAL HYSTERECTOMY No date: CESAREAN SECTION 08/08/2017: COLONOSCOPY WITH PROPOFOL ; N/A     Comment:  Procedure: COLONOSCOPY WITH PROPOFOL ;  Surgeon: Viktoria Lamar DASEN, MD;  Location: [facility] ENDOSCOPY;  Service:               Endoscopy;  Laterality: N/A; 01/12/2021: COLONOSCOPY WITH PROPOFOL ; N/A     Comment:  Procedure:  COLONOSCOPY WITH PROPOFOL ;  Surgeon:               Maryruth Ole DASEN, MD;  Location: [facility] ENDOSCOPY;                Service: Endoscopy;  Laterality: N/A;  DM 05/11/2021: COLONOSCOPY WITH PROPOFOL ; N/A     Comment:  Procedure: COLONOSCOPY WITH PROPOFOL ;  Surgeon:               Maryruth Ole DASEN, MD;  Location: [facility] ENDOSCOPY;                Service: Endoscopy;  Laterality: N/A;  DM 02/08/2022: COLONOSCOPY WITH PROPOFOL ; N/A     Comment:  Procedure: COLONOSCOPY WITH PROPOFOL ;  Surgeon:               Maryruth Ole DASEN, MD;  Location: [facility] ENDOSCOPY;                Service: Endoscopy;  Laterality: N/A; No date: DIAGNOSTIC LAPAROSCOPY 08/08/2017: ESOPHAGOGASTRODUODENOSCOPY (EGD) WITH PROPOFOL ; N/A     Comment:  Procedure: ESOPHAGOGASTRODUODENOSCOPY (EGD) WITH               PROPOFOL ;  Surgeon: Viktoria Lamar DASEN, MD;  Location:               [facility] ENDOSCOPY;  Service: Endoscopy;  Laterality: N/A; No date: KNEE SURGERY     Comment:  [facility] 01/22/2017: LAPAROSCOPIC HYSTERECTOMY; Bilateral     Comment:  Procedure: HYSTERECTOMY TOTAL LAPAROSCOPIC BSO;  Surgeon: Lake Read, MD;  Location: [facility] ORS;                Service: Gynecology;  Laterality: Bilateral;  BMI    Body Mass Index: 36.21 kg/m      Reproductive/Obstetrics negative OB ROS                             Anesthesia Physical Anesthesia Plan  ASA: 3  Anesthesia Plan: General   Post-op Pain Management:    Induction: Intravenous  PONV Risk Score and Plan: Propofol  infusion and TIVA  Airway Management Planned: Natural Airway and Nasal Cannula  Additional Equipment:   Intra-op Plan:   Post-operative Plan:   Informed Consent: I have reviewed the patients History and Physical, chart, labs and discussed the procedure including the risks, benefits and alternatives for the proposed anesthesia with the patient or authorized representative who has indicated his/her understanding and  acceptance.     Dental Advisory Given  Plan Discussed with: CRNA  Anesthesia Plan Comments:        Anesthesia Quick Evaluation

## 2023-12-05 DIAGNOSIS — K838 Other specified diseases of biliary tract: Secondary | ICD-10-CM

## 2023-12-05 LAB — GLUCOSE, CAPILLARY: Glucose-Capillary: 455 mg/dL — ABNORMAL HIGH (ref 70–99)

## 2023-12-05 MED ORDER — AMOXICILLIN-POT CLAVULANATE 875-125 MG PO TABS: 1.0000 | ORAL_TABLET | Freq: Two times a day (BID) | ORAL | 0 refills | Status: AC

## 2023-12-05 MED ORDER — OXYCODONE HCL 5 MG PO TABS: 5.0000 mg | ORAL_TABLET | Freq: Four times a day (QID) | ORAL | 0 refills | Status: DC | PRN

## 2023-12-05 MED ORDER — METFORMIN HCL 500 MG PO TABS: 1000.0000 mg | ORAL_TABLET | Freq: Two times a day (BID) | ORAL | Status: DC

## 2023-12-05 NOTE — Discharge Summary (Signed)
 [facility] SURGICAL DISCHARGE SUMMARY  Patient ID: Kristen Wong MRN: [id number] DOB/AGE: 1965-10-27 58 y.o.  Admit date: 12/03/2023 Discharge date: 12/05/2023  Discharge Diagnoses Patient Active Problem List   Diagnosis Date Noted   Bile duct leak 12/04/2023   Acute cholecystitis 12/03/2023   Insomnia 10/28/2023   Calculus of gallbladder without cholecystitis without obstruction 10/28/2023   Multiple pulmonary nodules determined by computed tomography of lung 10/28/2023   Seborrheic dermatitis 10/28/2023   Elevated LFTs 07/28/2023   Chronic obstructive pulmonary disease (HCC) 07/28/2023   Vitamin D  deficiency 07/13/2023   Goiter 07/13/2023   Centrilobular emphysema (HCC) 07/13/2023   Screening for lung cancer 07/13/2023   Breast cancer screening by mammogram 07/13/2023   Colon cancer screening 07/13/2023   Encounter for screening for HIV 07/13/2023   Need for hepatitis C screening test 07/13/2023   Hypertension associated with diabetes (HCC) 07/13/2023   Mood disorder (HCC) 07/13/2023   Arthritis 07/13/2023   Hyperlipidemia associated with type 2 diabetes mellitus (HCC) 07/13/2023   Aortic atherosclerosis (HCC) 07/01/2023   Secondary erythrocytosis 06/07/2022   Leukocytosis 06/07/2022   Hot flashes, menopausal 12/10/2017   Primary osteoarthritis of both hands 11/06/2017   Pain in unspecified joint 09/04/2017   Endometrial hyperplasia 02/26/2017   S/P laparoscopic hysterectomy 01/22/2017   Endometrial hyperplasia without atypia, complex 01/15/2017   Elevated hemoglobin (HCC) 01/15/2017   Class 2 obesity with serious comorbidity and body mass index (BMI) of 38.0 to 38.9 in adult 01/15/2017   Metabolic syndrome 01/15/2017   Tobacco abuse counseling 01/15/2017   Cellulitis of breast 12/22/2016   Polycythemia 12/22/2016   Diabetes mellitus with microalbuminuria (HCC) 12/20/2016   Elevated blood pressure reading 06/21/2016   Primary osteoarthritis of both  knees 06/21/2016   Morbid obesity (HCC) 06/21/2016    Consultants Gastroenterology  Procedures 12/03/2023 - Dr Marinda:  Robotic Assisted Laparoscopic Subtotal Cholecystectomy  12/04/2023 - Dr Jinny:  ERCP  HPI: Kristen Wong is a 58 y.o. female with history of biliary colic who presented to [facility] On 06/25 for scheduled cholecystectomy.   Hospital Course: Informed consent was obtained and documented, and patient underwent robotic assisted laparoscopic subtotal cholecystectomy complicated by gangrenous gallbladder and significant friability (Dr Marinda, 12/03/2023).  Post-operatively, gastroenterology was immediately consulted given intra-operative findings. Patient underwent ERCP with stent on 06/26. She has good response to this. Advancement of patient's diet and ambulation were well-tolerated. The remainder of patient's hospital course was essentially unremarkable, and discharge planning was initiated accordingly with patient safely able to be discharged home with appropriate discharge instructions, antibiotics (Augmentin x7 days), pain control, and outpatient follow-up after all of her questions were answered to her expressed satisfaction.   Discharge Condition: Good   Physical Examination:  Constitutional: Well appearing female; NAD Pulmonary: Normal effort, no respiratory distress Gastrointestinal: Soft, incisional soreness improved, non-distended. Surgical drain in right abdomen; output more serous, slowing, no leakage around drain this AM Skin: Laparoscopic incisions are CDI with dermabond, no erythema    Allergies as of 12/05/2023       Reactions   Penicillin G Nausea Only   Has patient had a PCN reaction causing immediate rash, facial/tongue/throat swelling, SOB or lightheadedness with hypotension: No Has patient had a PCN reaction causing severe rash involving mucus membranes or skin necrosis: No Has patient had a PCN reaction that required hospitalization: No Has patient had  a PCN reaction occurring within the last 10 years: No If all of the above answers are NO, then may  proceed with Cephalosporin use.        Medication List     TAKE these medications    albuterol  108 (90 Base) MCG/ACT inhaler Commonly known as: VENTOLIN  HFA Inhale 1-2 puffs into the lungs every 6 (six) hours as needed.   amoxicillin-clavulanate 875-125 MG tablet Commonly known as: AUGMENTIN Take 1 tablet by mouth 2 (two) times daily for 7 days.   aspirin EC 81 MG tablet Take 81 mg by mouth daily. Swallow whole.   atorvastatin 20 MG tablet Commonly known as: LIPITOR Take 20 mg by mouth daily.   busPIRone  7.5 MG tablet Commonly known as: BUSPAR  Take 7.5 mg by mouth 2 (two) times daily.   carvedilol  3.125 MG tablet Commonly known as: COREG  TAKE 1 TABLET BY MOUTH TWICE A DAY WITH A MEAL   cetirizine 10 MG tablet Commonly known as: ZYRTEC Take 20 mg by mouth daily.   dapagliflozin  propanediol 10 MG Tabs tablet Commonly known as: Farxiga  Take 1 tablet (10 mg total) by mouth daily before breakfast.   etodolac 400 MG tablet Commonly known as: LODINE Take 400 mg by mouth 2 (two) times daily.   fluticasone  50 MCG/ACT nasal spray Commonly known as: FLONASE  Place 2 sprays into both nostrils daily.   ibuprofen  600 MG tablet Commonly known as: ADVIL  Take 1 tablet (600 mg total) by mouth every 6 (six) hours as needed for headache, moderate pain or cramping.   ketoconazole  2 % shampoo Commonly known as: NIZORAL  APPLY 1 APPLICATION TOPICALLY 2 (TWO) TIMES A WEEK.   metFORMIN  500 MG tablet Commonly known as: Glucophage  Take 2 tablets (1,000 mg total) by mouth 2 (two) times daily with a meal.   naproxen 500 MG tablet Commonly known as: NAPROSYN Take 500 mg by mouth 2 (two) times daily.   omeprazole 40 MG capsule Commonly known as: PRILOSEC TAKE 1 CAPSULE BY MOUTH ONCE DAILY 30 MINUTES BEFORE BREAKFAST   oxyCODONE  5 MG immediate release tablet Commonly known as: Oxy  IR/ROXICODONE  Take 1 tablet (5 mg total) by mouth every 6 (six) hours as needed for severe pain (pain score 7-10) or breakthrough pain.   PARoxetine 10 MG tablet Commonly known as: PAXIL TAKE 1 & 1 2 (ONE & ONE HALF) TABLETS BY MOUTH ONCE DAILY   polyethylene glycol powder 17 GM/SCOOP powder Commonly known as: MiraLax  Take 17 g by mouth daily.   traZODone  50 MG tablet Commonly known as: DESYREL  Take 1 tablet (50 mg total) by mouth at bedtime.   Vitamin D  (Ergocalciferol ) 1.25 MG (50000 UNIT) Caps capsule Commonly known as: DRISDOL  Take 1 capsule (50,000 Units total) by mouth every 7 (seven) days.          Follow-up Information     Marinda Jayson KIDD, MD. Go on 12/11/2023.   Specialty: General Surgery Why: Go to appointment on 07/03 at 1115 AM Contact information: [address] [place] [address] [phone]                  Time spent on discharge management including discussion of hospital course, clinical condition, outpatient instructions, prescriptions, and follow up with the patient and members of the medical team: >30 minutes  -- Arthea Platt , PA-C [facility]  12/05/2023, 11:14 AM [phone] M-F: 7am - 4pm

## 2023-12-05 NOTE — Plan of Care (Signed)

## 2023-12-05 NOTE — Discharge Instructions (Signed)
 In addition to included general post-operative instructions,  Diet: Resume home diet. Recommend avoiding or limiting fatty/greasy foods over the next few days/week. If you do eat these, you may (or may not) notice diarrhea. This is expected while your body adjusts to not having a gallbladder, and it typically resolves with time.    Activity: No heavy lifting >20 pounds (children, pets, laundry, garbage) or strenuous activity for 4 weeks, but light activity and walking are encouraged. Do not drive or drink alcohol if taking narcotic pain medications or having pain that might distract from driving.  Drain: Change drain dressing at home as needed. Monitor and record output daily. Hand out provided for this, please bring this with you to your follow up.   Wound care: If you can keep drain site covered and water proof, you may shower/get incision wet with soapy water and pat dry (do not rub incisions), but no baths or submerging incision underwater until follow-up.   Medications: Resume all home medications. For mild to moderate pain: acetaminophen  (Tylenol ) or ibuprofen /naproxen (if no kidney disease). Combining Tylenol  with alcohol can substantially increase your risk of causing liver disease. Narcotic pain medications, if prescribed, can be used for severe pain, though may cause nausea, constipation, and drowsiness. Do not combine Tylenol  and Percocet (or similar) within a 6 hour period as Percocet (and similar) contain(s) Tylenol . If you do not need the narcotic pain medication, you do not need to fill the prescription.  Call office [phone] / [phone]) at any time if any questions, worsening pain, fevers/chills, bleeding, drainage from incision site, or other concerns.

## 2023-12-05 NOTE — TOC CM/SW Note (Signed)
 Transition of Care [facility]) - Inpatient Brief Assessment   Patient Details  Name: Kristen Wong MRN: [id number] Date of Birth: 08-29-1965  Transition [facility] Care [facility]) CM/SW Contact:    Lauraine JAYSON Carpen, LCSW Phone Number: 12/05/2023, 11:27 AM   Clinical Narrative: Patient has orders to discharge home today. Chart reviewed. No TOC needs identified. CSW signing off.  Transition of Care Asessment: Insurance and Status: Insurance coverage has been reviewed Patient has primary care physician: Yes Home environment has been reviewed: Single family home Prior level of function:: Not documented Prior/Current Home Services: No current home services Social Drivers of Health Review: SDOH reviewed no interventions necessary Readmission risk has been reviewed: Yes Transition of care needs: no transition of care needs at this time

## 2023-12-05 NOTE — Plan of Care (Incomplete)
 Pt D/C home with Daughter, IV removed. Wound care provided. BP (!) 157/73 (BP Location: Left Arm)   Pulse (!) 107   Temp 98.6 F (37 C) (Oral)   Resp 18   Ht 5' 4 (1.626 m)   Wt 95.7 kg   SpO2 93%   BMI 36.21 kg/m  AVS reviewed all follow up questions answered. No other needs voiced at this time. Kristen Wong 12/05/23

## 2023-12-05 NOTE — Anesthesia Postprocedure Evaluation (Signed)
 Anesthesia Post Note  Patient: Kristen Wong  Procedure(s) Performed: SUBTOTAL CHOLECYSTECTOMY, ROBOT-ASSISTED, LAPAROSCOPIC (Abdomen)  Patient location during evaluation: PACU Anesthesia Type: General Level of consciousness: awake and alert Pain management: pain level controlled Vital Signs Assessment: post-procedure vital signs reviewed and stable Respiratory status: spontaneous breathing, nonlabored ventilation, respiratory function stable and patient connected to nasal cannula oxygen Cardiovascular status: blood pressure returned to baseline and stable Postop Assessment: no apparent nausea or vomiting Anesthetic complications: no   No notable events documented.   Last Vitals:  Vitals:   12/04/23 2157 12/05/23 0341  BP: (!) 158/71 (!) 153/70  Pulse: (!) 105 (!) 105  Resp: 20 20  Temp: 36.6 C 37 C  SpO2: 91% 91%    Last Pain:  Vitals:   12/05/23 0523  TempSrc:   PainSc: 6                  Prentice Murphy

## 2023-12-05 NOTE — Progress Notes (Signed)
    Rogelia Copping, MD [facility]   [address], [address] [place], [place] [address] Phone: [phone] Fax : [phone]   Subjective: The patient is status post ERCP for bile duct leak after subtotal cholecystectomy for gangrenous cholecystitis.  The patient had a CBD stent placed yesterday with decreased drainage from her subhepatic cutaneous drain.  The patient is not reporting any nausea vomiting or worsening of her abdominal pain.   Objective: Vital signs in last 24 hours: Vitals:   12/04/23 1504 12/04/23 2157 12/05/23 0341 12/05/23 0841  BP:  (!) 158/71 (!) 153/70 (!) 157/73  Pulse:  (!) 105 (!) 105 (!) 107  Resp: 16 20 20 18   Temp:  97.9 F (36.6 C) 98.6 F (37 C) 98.6 F (37 C)  TempSrc:  Oral Oral Oral  SpO2:  91% 91% 93%  Weight:      Height:       Weight change: 0 kg  Intake/Output Summary (Last 24 hours) at 12/05/2023 1139 Last data filed at 12/05/2023 1045 Gross per 24 hour  Intake 240 ml  Output 225 ml  Net 15 ml     Exam: General: The patient is laying in bed in no apparent distress alert and oriented x 3  Lab Results: @LABTEST2 @ Micro Results: No results found for this or any previous visit (from the past 240 hours). Studies/Results: DG C-Arm 1-60 Min-No Report Result Date: 12/04/2023 Fluoroscopy was utilized by the requesting physician.  No radiographic interpretation.   Medications: I have reviewed the patient's current medications. Scheduled Meds:  acetaminophen   1,000 mg Oral Q6H   busPIRone   7.5 mg Oral BID   diclofenac   100 mg Rectal Once   insulin  aspart  0-20 Units Subcutaneous TID WC   insulin  aspart  0-5 Units Subcutaneous QHS   metFORMIN   1,000 mg Oral BID WC   pantoprazole   40 mg Oral Daily   traZODone   50 mg Oral QHS   Continuous Infusions:  piperacillin -tazobactam (ZOSYN )  IV 3.375 g (12/05/23 0523)   PRN Meds:.acetaminophen , albuterol , fluticasone , ketorolac , methocarbamol  (ROBAXIN ) injection, morphine  injection, oxyCODONE ,  oxyCODONE    Assessment: Principal Problem:   Acute cholecystitis Active Problems:   Bile duct leak    Plan: The patient has decreased drainage with a stent placed in her CBD yesterday.  The patient is not have any signs of pancreatitis and tolerated a meal this morning.  The patient should have a repeat ERCP in 3 months to have the stent removed.  Nothing further to do from a GI point of view.  I will sign off.  Please call if any further GI concerns or questions.  We would like to thank you for the opportunity to participate in the care of Kristen Wong.    LOS: 2 days   Rogelia Copping, MD.FACG 12/05/2023, 11:39 AM Pager [phone] 7am-5pm  Check AMION for 5pm -7am coverage and on weekends

## 2023-12-08 ENCOUNTER — Telehealth: Payer: Self-pay

## 2023-12-08 LAB — GLUCOSE, CAPILLARY
Glucose-Capillary: 303 mg/dL — ABNORMAL HIGH (ref 70–99)
Glucose-Capillary: 304 mg/dL — ABNORMAL HIGH (ref 70–99)

## 2023-12-08 NOTE — Transitions of Care (Post Inpatient/ED Visit) (Signed)
 12/08/2023  Name: Kristen Wong MRN: [id number] DOB: Jan 14, 1966  Today's TOC FU Call Status: Today's TOC FU Call Status:: Successful TOC FU Call Completed TOC FU Call Complete Date: 12/08/23 Patient's Name and Date of Birth confirmed.  Transition Care Management Follow-up Telephone Call Date of Discharge: 12/05/23 Discharge Facility: [facility]) Type of Discharge: Inpatient Admission Primary Inpatient Discharge Diagnosis:: Cholecystectomy How have you been since you were released from the hospital?: Worse (Complains of pain where she had surgery) Any questions or concerns?: No  Items Reviewed: Did you receive and understand the discharge instructions provided?: Yes Medications obtained,verified, and reconciled?: Yes (Medications Reviewed) Any new allergies since your discharge?: No Dietary orders reviewed?: Yes Type of Diet Ordered:: Low fat Do you have support at home?: Yes People in Home [RPT]: child(ren), adult Name of Support/Comfort Primary Source: Chelsey Swaggerty  Medications Reviewed Today: Medications Reviewed Today     Reviewed by Moises Reusing, RN (Case Manager) on 12/08/23 at 1212  Med List Status: <None>   Medication Order Taking? Sig Documenting Provider Last Dose Status Informant  albuterol  (VENTOLIN  HFA) 108 (90 Base) MCG/ACT inhaler [id number] Yes Inhale 1-2 puffs into the lungs every 6 (six) hours as needed. Hope Merle, MD  Active Self  amoxicillin-clavulanate (AUGMENTIN) 875-125 MG tablet [id number] Yes Take 1 tablet by mouth 2 (two) times daily for 7 days. Schulz, Zachary R, PA-C  Active   aspirin EC 81 MG tablet [id number] Yes Take 81 mg by mouth daily. Swallow whole. [provider]  Active Self  atorvastatin (LIPITOR) 20 MG tablet [id number] Yes Take 20 mg by mouth daily. [provider]  Active Self  busPIRone  (BUSPAR ) 7.5 MG tablet [id number] Yes Take 7.5 mg by mouth 2 (two) times daily. [provider]  Active Self  carvedilol  (COREG ) 3.125 MG tablet [id number] Yes TAKE 1 TABLET BY MOUTH TWICE A DAY WITH A MEAL Hope Merle, MD  Active Self  cetirizine (ZYRTEC) 10 MG tablet [id number] Yes Take 20 mg by mouth daily. [provider]  Active Self  dapagliflozin  propanediol (FARXIGA ) 10 MG TABS tablet [id number] Yes Take 1 tablet (10 mg total) by mouth daily before breakfast. Hope Merle, MD  Active Self  etodolac (LODINE) 400 MG tablet [id number] Yes Take 400 mg by mouth 2 (two) times daily. [provider]  Active Self  fluticasone  (FLONASE ) 50 MCG/ACT nasal spray [id number] Yes Place 2 sprays into both nostrils daily. [provider]  Active Self  ibuprofen  (ADVIL ,MOTRIN ) 600 MG tablet [id number]  Take 1 tablet (600 mg total) by mouth every 6 (six) hours as needed for headache, moderate pain or cramping.  Patient not taking: Reported on 12/08/2023   Lake Read, MD  Active Self  ketoconazole  (NIZORAL ) 2 % shampoo [id number] Yes APPLY 1 APPLICATION TOPICALLY 2 (TWO) TIMES A WEEK. Bair, Luke, MD  Active Self  metFORMIN  (GLUCOPHAGE ) 500 MG tablet [id number] Yes Take 2 tablets (1,000 mg total) by mouth 2 (two) times daily with a meal. Bair, Kalpana, MD  Active Self  naproxen (NAPROSYN) 500 MG tablet [id number] Yes Take 500 mg by mouth 2 (two) times daily. [provider]  Active Self  omeprazole (PRILOSEC) 40 MG capsule [id number] Yes TAKE 1 CAPSULE BY MOUTH ONCE DAILY 30 MINUTES BEFORE BREAKFAST [provider]  Active Self  oxyCODONE  (OXY IR/ROXICODONE ) 5 MG immediate release tablet [id number] Yes Take 1 tablet (5 mg total) by mouth every 6 (six) hours as needed for severe pain (pain score  7-10) or breakthrough pain. Schulz, Zachary R, PA-C  Active   PARoxetine (PAXIL) 10 MG tablet [id number] Yes TAKE 1 & 1 2 (ONE & ONE HALF) TABLETS BY MOUTH ONCE DAILY [provider]  Active Self  polyethylene glycol powder (MIRALAX ) 17 GM/SCOOP powder [id number]  Yes Take 17 g by mouth daily. Vaillancourt, Samantha, PA-C  Active Self  traZODone  (DESYREL ) 50 MG tablet [id number] Yes Take 1 tablet (50 mg total) by mouth at bedtime. Bair, Luke, MD  Active Self  Vitamin D , Ergocalciferol , (DRISDOL ) 1.25 MG (50000 UNIT) CAPS capsule [id number] Yes Take 1 capsule (50,000 Units total) by mouth every 7 (seven) days. Hope Merle, MD  Active Self            Home Care and Equipment/Supplies: Were Home Health Services Ordered?: No Any new equipment or medical supplies ordered?: No  Functional Questionnaire: Do you need assistance with bathing/showering or dressing?: No Do you need assistance with meal preparation?: Yes (Her daughter will assist. Not much of an appetite) Do you need assistance with eating?: No Do you have difficulty maintaining continence: No Do you need assistance with getting out of bed/getting out of a chair/moving?: No Do you have difficulty managing or taking your medications?: No  Follow up appointments reviewed: PCP Follow-up appointment confirmed?: Yes Date of PCP follow-up appointment?: 12/09/23 Follow-up Provider: [facility] Follow-up appointment confirmed?: Yes Date of Specialist follow-up appointment?: 12/11/23 Follow-Up Specialty Provider:: Jayson Endow Do you need transportation to your follow-up appointment?: No Do you understand care options if your condition(s) worsen?: Yes-patient verbalized understanding  SDOH Interventions Today    Flowsheet Row Most Recent Value  SDOH Interventions   Food Insecurity Interventions Intervention Not Indicated  Housing Interventions Intervention Not Indicated  Transportation Interventions Intervention Not Indicated  Utilities Interventions Intervention Not Indicated    Goals      [facility] Transitions of Care (TOC) Care Plan     Problems:  Recent Hospitalization for treatment of Post surgery from Cholecystectomy and Pain  Goal:  Over the next 30 days, the  patient will not experience hospital readmission  Interventions:   Surgery (Cholecystectomy 12/03/23): Evaluation of current treatment plan related to Post Cholecystectomy with drain placement and Pain surgery reviewed post-operative instructions with patient/caregiver reviewed medications with patient and addressed questions reviewed scheduled provider appointments with patient7/3/25 with Jayson Endow confirmed availability of transportation to all appointments Patient's daughter will take her Reviewed pain management - stay on top of your pain. Medicate every 6 hours and take Tylenol  in between narcotic. Adequate rest Empty drain when it reaches 30ml and record out put Change bandage to incision as directed by surgeon Take Miralax  daily to avoid constipation  Patient Self Care Activities:  Attend all scheduled provider appointments Call pharmacy for medication refills 3-7 days in advance of running out of medications Call provider office for new concerns or questions  Notify RN Care Manager of TOC call rescheduling needs Participate in Transition of Care Program/Attend TOC scheduled calls Perform all self care activities independently  Take medications as prescribed    Plan:  Telephone follow up appointment with care management team member scheduled for:  July 8th at 11:00am        Goals Addressed             This Visit's Progress    [facility] Transitions of Care (TOC) Care Plan       Problems:  Recent Hospitalization for treatment of Post surgery from Cholecystectomy and Pain  Goal:  Over the next 30 days, the patient will not experience hospital readmission  Interventions:   Surgery (Cholecystectomy 12/03/23): Evaluation of current treatment plan related to Post Cholecystectomy with drain placement and Pain surgery reviewed post-operative instructions with patient/caregiver reviewed medications with patient and addressed questions reviewed scheduled provider  appointments with patient7/3/25 with Jayson Endow confirmed availability of transportation to all appointments Patient's daughter will take her Reviewed pain management - stay on top of your pain. Medicate every 6 hours and take Tylenol  in between narcotic. Adequate rest Empty drain when it reaches 30ml and record out put Change bandage to incision as directed by surgeon Take Miralax  daily to avoid constipation  Patient Self Care Activities:  Attend all scheduled provider appointments Call pharmacy for medication refills 3-7 days in advance of running out of medications Call provider office for new concerns or questions  Notify RN Care Manager of TOC call rescheduling needs Participate in Transition of Care Program/Attend TOC scheduled calls Perform all self care activities independently  Take medications as prescribed    Plan:  Telephone follow up appointment with care management team member scheduled for:  July 8th at 11:00am        Medford Balboa, BSN, RN [place]  [facility] - [facility] RN Care Manager [phone]

## 2023-12-08 NOTE — Patient Instructions (Signed)
 Visit Information  Thank you for taking time to visit with me today. Please don't hesitate to contact me if I can be of assistance to you before our next scheduled telephone appointment.  Our next appointment is by telephone on Tuesday July 8th at 11:00am  Following is a copy of your care plan:   Goals Addressed             This Visit's Progress    VBCI Transitions of Care (TOC) Care Plan       Problems:  Recent Hospitalization for treatment of Post surgery from Cholecystectomy and Pain  Goal:  Over the next 30 days, the patient will not experience hospital readmission  Interventions:   Surgery (Cholecystectomy 12/03/23): Evaluation of current treatment plan related to Post Cholecystectomy with drain placement and Pain surgery reviewed post-operative instructions with patient/caregiver reviewed medications with patient and addressed questions reviewed scheduled provider appointments with patient7/3/25 with Jayson Endow confirmed availability of transportation to all appointments Patient's daughter will take her Reviewed pain management - stay on top of your pain. Medicate every 6 hours and take Tylenol  in between narcotic. Adequate rest Empty drain when it reaches 30ml and record out put Change bandage to incision as directed by surgeon Take Miralax  daily to avoid constipation  Patient Self Care Activities:  Attend all scheduled provider appointments Call pharmacy for medication refills 3-7 days in advance of running out of medications Call provider office for new concerns or questions  Notify RN Care Manager of TOC call rescheduling needs Participate in Transition of Care Program/Attend TOC scheduled calls Perform all self care activities independently  Take medications as prescribed    Plan:  Telephone follow up appointment with care management team member scheduled for:  July 8th at 11:00am        Patient verbalizes understanding of instructions and care plan  provided today and agrees to view in MyChart. Active MyChart status and patient understanding of how to access instructions and care plan via MyChart confirmed with patient.     The patient has been provided with contact information for the care management team and has been advised to call with any health related questions or concerns.   Please call the care guide team at [phone] if you need to cancel or reschedule your appointment.   Please call the Suicide and Crisis Lifeline: 988 call the [place]  National Suicide Prevention Lifeline: (815)286-6590 or TTY: 915-828-2768 TTY 406-685-9365) to talk to a trained counselor if you are experiencing a Mental Health or Behavioral Health Crisis or need someone to talk to.  Medford Balboa, BSN, RN [place]  VBCI - [employer] [facility] RN Care Manager [phone]

## 2023-12-09 ENCOUNTER — Other Ambulatory Visit: Payer: Self-pay | Admitting: General Surgery

## 2023-12-09 ENCOUNTER — Telehealth: Payer: Self-pay | Admitting: General Surgery

## 2023-12-09 ENCOUNTER — Ambulatory Visit

## 2023-12-09 ENCOUNTER — Telehealth: Payer: Self-pay

## 2023-12-09 MED ORDER — OXYCODONE HCL 5 MG PO TABS: 5.0000 mg | ORAL_TABLET | Freq: Three times a day (TID) | ORAL | 0 refills | Status: DC | PRN

## 2023-12-09 NOTE — Telephone Encounter (Signed)
 Error

## 2023-12-09 NOTE — Telephone Encounter (Signed)
 Patient calls, she had robotic cholecystectomy done 12/03/23 with Dr. Marinda.  She has post op scheduled for 12/11/23.  In the meantime, she is out of her Oxycodone  and is requesting a refill. Says she is in a lot of pain.  Please call patient.

## 2023-12-10 NOTE — Telephone Encounter (Signed)
 Patient is scheduled with Dr Abbey at 4:00 PM on July 3rd.

## 2023-12-10 NOTE — Telephone Encounter (Signed)
 Left message on voicemail.

## 2023-12-11 ENCOUNTER — Ambulatory Visit: Payer: Self-pay

## 2023-12-11 ENCOUNTER — Encounter: Payer: Self-pay | Admitting: General Surgery

## 2023-12-11 ENCOUNTER — Ambulatory Visit (INDEPENDENT_AMBULATORY_CARE_PROVIDER_SITE_OTHER): Admitting: General Surgery

## 2023-12-11 VITALS — BP 142/82 | HR 166 | Ht 64.0 in | Wt 199.8 lb

## 2023-12-11 DIAGNOSIS — Z09 Encounter for follow-up examination after completed treatment for conditions other than malignant neoplasm: Secondary | ICD-10-CM

## 2023-12-11 DIAGNOSIS — K810 Acute cholecystitis: Secondary | ICD-10-CM

## 2023-12-11 DIAGNOSIS — K8010 Calculus of gallbladder with chronic cholecystitis without obstruction: Secondary | ICD-10-CM

## 2023-12-11 NOTE — Patient Instructions (Signed)

## 2023-12-16 ENCOUNTER — Other Ambulatory Visit: Payer: Self-pay

## 2023-12-16 DIAGNOSIS — E559 Vitamin D deficiency, unspecified: Secondary | ICD-10-CM

## 2023-12-16 NOTE — Telephone Encounter (Signed)
 Patient completed treatment, refill not appropriate. She can start taking over the counter vitamin D  1000 units daily.   Luke Shade, MD

## 2023-12-16 NOTE — Transitions of Care (Post Inpatient/ED Visit) (Signed)
 Transition of Care week 2  Visit Note  12/16/2023  Name: Kristen Wong MRN: [id number]          DOB: 1966-03-14  Situation: Patient enrolled in [facility] 30-day program. Visit completed with Johnae Fenoglio by telephone.   Background:     Past Medical History:  Diagnosis Date   Arthritis of both knees    Atypical chest pain 07/28/2023   COPD (chronic obstructive pulmonary disease) (HCC)    Depression    GERD (gastroesophageal reflux disease)    Heart murmur (asymptomatic)    Hepatic steatosis    Hypertension    Irritable bowel syndrome    Polycythemia    T2DM (type 2 diabetes mellitus) (HCC) 2018   Vitamin B 12 deficiency 07/13/2023    Assessment: Patient Reported Symptoms: Cognitive Cognitive Status: Alert and oriented to person, place, and time      Neurological Neurological Review of Symptoms: No symptoms reported    HEENT HEENT Symptoms Reported: No symptoms reported      Cardiovascular Cardiovascular Symptoms Reported: No symptoms reported Weight: 199 lb (90.3 kg)  Respiratory Respiratory Symptoms Reported: Other: Other Respiratory Symptoms: Wet cough Additional Respiratory Details: The patient continues to smoke cigarettes daily Respiratory Management Strategies: Medication therapy Respiratory Self-Management Outcome: 4 (good)  Endocrine Endocrine Symptoms Reported: No symptoms reported Is patient diabetic?: Yes Is patient checking blood sugars at home?: No    Gastrointestinal Gastrointestinal Symptoms Reported: Incontinence, Other Other Gastrointestinal Symptoms: Loose stools Additional Gastrointestinal Details: The patient has had an incontinent episode related to loose stools. She has decreased the amount of Miralax  she is taking Gastrointestinal Management Strategies: Adequate rest, Incontinence garment/pad    Genitourinary Genitourinary Symptoms Reported: No symptoms reported    Integumentary Integumentary Symptoms Reported: Incision Additional Integumentary  Details: The drain has been removed and the incision is clean and dry Skin Management Strategies: Dressing changes Skin Self-Management Outcome: 4 (good) Skin Comment: The patient states she has been to the surgeon and she is going to shower since her discharge summary states she is able to do that.  Musculoskeletal Musculoskelatal Symptoms Reviewed: Joint pain Other Musculoskeletal Symptoms: Left shoulder pain Musculoskeletal Management Strategies: Medication therapy, Routine screening, Adequate rest Musculoskeletal Comment: The patient contiunes to complain of significant abdominal pain and has taken all of her Oxycodone . she has pain to her left scapula and shoulder that has only improved slightly      Psychosocial Psychosocial Symptoms Reported: No symptoms reported         There were no vitals filed for this visit.  Medications Reviewed Today     Reviewed by Moises Reusing, RN (Case Manager) on 12/16/23 at 1116  Med List Status: <None>   Medication Order Taking? Sig Documenting Provider Last Dose Status Informant  albuterol  (VENTOLIN  HFA) 108 (90 Base) MCG/ACT inhaler [id number]  Inhale 1-2 puffs into the lungs every 6 (six) hours as needed. Hope Merle, MD  Active Self  aspirin EC 81 MG tablet [id number]  Take 81 mg by mouth daily. Swallow whole. [provider]  Active Self  atorvastatin (LIPITOR) 20 MG tablet [id number]  Take 20 mg by mouth daily. [provider]  Active Self  busPIRone  (BUSPAR ) 7.5 MG tablet [id number]  Take 7.5 mg by mouth 2 (two) times daily. [provider]  Active Self  carvedilol  (COREG ) 3.125 MG tablet [id number]  TAKE 1 TABLET BY MOUTH TWICE A DAY WITH A MEAL Hope Merle, MD  Active Self  cetirizine (ZYRTEC) 10 MG tablet [id number]  Take 20 mg by mouth daily. [provider]  Active Self  dapagliflozin  propanediol (FARXIGA ) 10 MG TABS tablet [id number]  Take 1 tablet (10 mg total) by mouth daily before breakfast. Hope Merle, MD  Active Self  etodolac (LODINE) 400 MG tablet [id number]  Take 400 mg by mouth 2 (two) times daily. [provider]  Active Self  fluticasone  (FLONASE ) 50 MCG/ACT nasal spray [id number]  Place 2 sprays into both nostrils daily. [provider]  Active Self  ibuprofen  (ADVIL ,MOTRIN ) 600 MG tablet [id number]  Take 1 tablet (600 mg total) by mouth every 6 (six) hours as needed for headache, moderate pain or cramping. Lake Read, MD  Active Self  ketoconazole  (NIZORAL ) 2 % shampoo [id number]  APPLY 1 APPLICATION TOPICALLY 2 (TWO) TIMES A WEEK. Bair, Luke, MD  Active Self  metFORMIN  (GLUCOPHAGE ) 500 MG tablet [id number]  Take 2 tablets (1,000 mg total) by mouth 2 (two) times daily with a meal. Bair, Kalpana, MD  Active Self  naproxen (NAPROSYN) 500 MG tablet [id number]  Take 500 mg by mouth 2 (two) times daily. [provider]  Active Self  omeprazole (PRILOSEC) 40 MG capsule [id number]  TAKE 1 CAPSULE BY MOUTH ONCE DAILY 30 MINUTES BEFORE BREAKFAST [provider]  Active Self  oxyCODONE  (OXY IR/ROXICODONE ) 5 MG immediate release tablet [id number]  Take 1 tablet (5 mg total) by mouth every 6 (six) hours as needed for severe pain (pain score 7-10) or breakthrough pain. Schulz, Zachary R, PA-C  Consider Medication Status and Discontinue (Duplicate)   oxyCODONE  (ROXICODONE ) 5 MG immediate release tablet [id number]  Take 1 tablet (5 mg total) by mouth every 8 (eight) hours as needed. Marinda Jayson KIDD, MD  Active   PARoxetine (PAXIL) 10 MG tablet [id number]  TAKE 1 & 1 2 (ONE & ONE HALF) TABLETS BY MOUTH ONCE DAILY [provider]  Active Self  polyethylene glycol powder (MIRALAX ) 17 GM/SCOOP powder [id number]  Take 17 g by mouth daily. Vaillancourt, Samantha, PA-C  Active Self  traZODone  (DESYREL ) 50 MG tablet [id number]  Take 1 tablet (50 mg total) by mouth at bedtime. Bair, Luke, MD  Active Self  Vitamin D , Ergocalciferol , (DRISDOL ) 1.25 MG (50000 UNIT)  CAPS capsule [id number]  Take 1 capsule (50,000 Units total) by mouth every 7 (seven) days. Hope Merle, MD  Active Self            Recommendation:   PCP Follow-up Continue Current Plan of Care Encouraged the patient to reschedule her PCP follow up  Follow Up Plan:   Telephone follow-up in 1 week  Medford Balboa, BSN, RN [place]  VBCI - [facility] RN Care Manager [phone]

## 2023-12-16 NOTE — Patient Instructions (Signed)
 Visit Information  Thank you for taking time to visit with me today. Please don't hesitate to contact me if I can be of assistance to you before our next scheduled telephone appointment.  Our next appointment is by telephone on July 15th at 11:00am  Following is a copy of your care plan:   Goals Addressed             This Visit's Progress    VBCI Transitions of Care (TOC) Care Plan       Problems: (reviewed 12/16/23) Recent Hospitalization for treatment of Post surgery from Cholecystectomy and Pain  Goal:  (reviewed 12/16/23) Over the next 30 days, the patient will not experience hospital readmission  Interventions:  (reviewed 12/16/23)  Surgery (Cholecystectomy 12/03/23): Evaluation of current treatment plan related to Post Cholecystectomy with drain placement and Pain surgery reviewed post-operative instructions with patient/caregiver reviewed medications with patient and addressed questions reviewed scheduled provider appointments with patient7/3/25 with Jayson Endow confirmed availability of transportation to all appointments Patient's daughter will take her Reviewed pain management - stay on top of your pain. Medicate every 6 hours and take Tylenol  in between narcotic. Adequate rest Empty drain when it reaches 30ml and record out put - removed 12/11/23 Change bandage to incision as directed by surgeon - covered. Patient  Take Miralax  daily to avoid constipation  Patient Self Care Activities:  (reviewed 12/16/23) Attend all scheduled provider appointments Call pharmacy for medication refills 3-7 days in advance of running out of medications Call provider office for new concerns or questions  Notify RN Care Manager of [facility] call rescheduling needs Participate in Transition of Care Program/Attend [facility] scheduled calls Perform all self care activities independently  Take medications as prescribed    Plan:  Telephone follow up appointment with care management team member scheduled for:   July 15th at 11:00am        Patient verbalizes understanding of instructions and care plan provided today and agrees to view in MyChart. Active MyChart status and patient understanding of how to access instructions and care plan via MyChart confirmed with patient.     The patient has been provided with contact information for the care management team and has been advised to call with any health related questions or concerns.   Please call the care guide team at [phone] if you need to cancel or reschedule your appointment.   Please call the Suicide and Crisis Lifeline: 988 call the [place]  National Suicide Prevention Lifeline: (579) 542-4365 or TTY: 831-716-2182 TTY (713) 749-8440) to talk to a trained counselor if you are experiencing a Mental Health or Behavioral Health Crisis or need someone to talk to.  Medford Balboa, BSN, RN   VBCI - [employer] [facility] RN Care Manager [phone]

## 2023-12-18 ENCOUNTER — Other Ambulatory Visit: Payer: Self-pay

## 2023-12-18 DIAGNOSIS — G4700 Insomnia, unspecified: Secondary | ICD-10-CM

## 2023-12-19 NOTE — Telephone Encounter (Signed)
 1. Insomnia, unspecified type - traZODone  (DESYREL ) 50 MG tablet; TAKE 1 TABLET BY MOUTH EVERYDAY AT BEDTIME  Dispense: 90 tablet; Refill: 1  Vi Whitesel, MD

## 2023-12-23 ENCOUNTER — Other Ambulatory Visit: Payer: Self-pay

## 2023-12-23 NOTE — Progress Notes (Signed)
 The patient returns today status post subtotal cholecystectomy with drain placement that required ERCP for bile leak.  She reports doing much better.  She says that her drain is not putting out any bile and the output has slowed.  Her pain is well-controlled and she is tolerating a diet and having bowel function.  On exam abdomen is soft, nontender and nondistended.  The robotic incisions are healing well.  Pathology results copied below. FINAL DIAGNOSIS        1. Gallbladder,  :       CHOLECYSTITIS WITH NECROSIS.       CHOLELITHIASIS.    Discussed with the patient.  Drain removed in clinic today.  Recommended soap and covering the drain site with a Band-Aid.  Recommended continued lifting restrictions for another 2 weeks.  She can follow-up as needed.

## 2023-12-23 NOTE — Transitions of Care (Post Inpatient/ED Visit) (Signed)
 Transition of Care week 3  Visit Note  12/23/2023  Name: Kristen Wong MRN: [id number]          DOB: April 01, 1966  Situation: Patient enrolled in [facility] 30-day program. Visit completed with Sadee Croston by telephone.   Background:   Past Medical History:  Diagnosis Date   Arthritis of both knees    Atypical chest pain 07/28/2023   COPD (chronic obstructive pulmonary disease) (HCC)    Depression    GERD (gastroesophageal reflux disease)    Heart murmur (asymptomatic)    Hepatic steatosis    Hypertension    Irritable bowel syndrome    Polycythemia    T2DM (type 2 diabetes mellitus) (HCC) 2018   Vitamin B 12 deficiency 07/13/2023    Assessment: Patient Reported Symptoms: Cognitive Cognitive Status: Alert and oriented to person, place, and time      Neurological Neurological Review of Symptoms: No symptoms reported    HEENT HEENT Symptoms Reported: No symptoms reported      Cardiovascular Cardiovascular Symptoms Reported: No symptoms reported Weight: 199 lb (90.3 kg)  Respiratory Respiratory Symptoms Reported: Other: Other Respiratory Symptoms: Wet cough Additional Respiratory Details: The patient continues to smoke cigarettes Respiratory Management Strategies: Adequate rest, Activity, Routine screening, Medication therapy Respiratory Self-Management Outcome: 4 (good)  Endocrine Endocrine Symptoms Reported: No symptoms reported Is patient diabetic?: Yes Is patient checking blood sugars at home?: No Endocrine Self-Management Outcome: 4 (good)  Gastrointestinal Gastrointestinal Symptoms Reported: Incontinence, Other Other Gastrointestinal Symptoms: Loose stool; Urgency Additional Gastrointestinal Details: The patient states she is having loose stools and she has had two incontinent episodes. She has a stent in place. Encouraged the patient multiple times to contact her Surgeon for an update and schedule an inperson appointment Gastrointestinal Management Strategies: Adequate  rest, Incontinence garment/pad Gastrointestinal Self-Management Outcome: 3 (uncertain) Gastrointestinal Comment: The patient also states she has not appetite and cannot tolerate much food. She feels like she might be losing weight but does not have a scale at home.    Genitourinary Genitourinary Symptoms Reported: No symptoms reported    Integumentary Integumentary Symptoms Reported: No symptoms reported    Musculoskeletal Musculoskelatal Symptoms Reviewed: Joint pain Other Musculoskeletal Symptoms: Left shoulder pain. She states she was reaching for something and has had some significant pain since. She declines PT. She states the pain is better this week. She manages the pain with Tylenol  Musculoskeletal Management Strategies: Medication therapy, Routine screening, Adequate rest   Patient at Risk for Falls Due to: Medication side effect, Impaired balance/gait Fall risk Follow up: Education provided  Psychosocial Psychosocial Symptoms Reported: No symptoms reported         There were no vitals filed for this visit.  Medications Reviewed Today     Reviewed by Moises Reusing, RN (Case Manager) on 12/23/23 at 1112  Med List Status: <None>   Medication Order Taking? Sig Documenting Provider Last Dose Status Informant  acetaminophen  (TYLENOL ) 500 MG tablet [id number] Yes Take 1,000 mg by mouth every 6 (six) hours as needed. [provider]  Active   albuterol  (VENTOLIN  HFA) 108 (90 Base) MCG/ACT inhaler [id number]  Inhale 1-2 puffs into the lungs every 6 (six) hours as needed. Hope Merle, MD  Active Self  aspirin EC 81 MG tablet [id number]  Take 81 mg by mouth daily. Swallow whole. [provider]  Active Self  atorvastatin (LIPITOR) 20 MG tablet [id number]  Take 20 mg by mouth daily. [provider]  Active Self  busPIRone  (BUSPAR ) 7.5 MG  tablet [id number]  Take 7.5 mg by mouth 2 (two) times daily. [provider]  Active Self  carvedilol  (COREG ) 3.125  MG tablet [id number]  TAKE 1 TABLET BY MOUTH TWICE A DAY WITH A MEAL Hope Merle, MD  Active Self  cetirizine (ZYRTEC) 10 MG tablet [id number]  Take 20 mg by mouth daily. [provider]  Active Self  dapagliflozin  propanediol (FARXIGA ) 10 MG TABS tablet [id number]  Take 1 tablet (10 mg total) by mouth daily before breakfast. Hope Merle, MD  Active Self  etodolac (LODINE) 400 MG tablet [id number]  Take 400 mg by mouth 2 (two) times daily. [provider]  Active Self  fluticasone  (FLONASE ) 50 MCG/ACT nasal spray [id number]  Place 2 sprays into both nostrils daily. [provider]  Active Self  ibuprofen  (ADVIL ,MOTRIN ) 600 MG tablet [id number]  Take 1 tablet (600 mg total) by mouth every 6 (six) hours as needed for headache, moderate pain or cramping. Lake Read, MD  Active Self  ketoconazole  (NIZORAL ) 2 % shampoo [id number]  APPLY 1 APPLICATION TOPICALLY 2 (TWO) TIMES A WEEK. Bair, Luke, MD  Active Self  metFORMIN  (GLUCOPHAGE ) 500 MG tablet [id number]  Take 2 tablets (1,000 mg total) by mouth 2 (two) times daily with a meal. Bair, Kalpana, MD  Active Self  naproxen (NAPROSYN) 500 MG tablet [id number]  Take 500 mg by mouth 2 (two) times daily. [provider]  Active Self  omeprazole (PRILOSEC) 40 MG capsule [id number]  TAKE 1 CAPSULE BY MOUTH ONCE DAILY 30 MINUTES BEFORE BREAKFAST [provider]  Active Self  oxyCODONE  (OXY IR/ROXICODONE ) 5 MG immediate release tablet [id number]  Take 1 tablet (5 mg total) by mouth every 6 (six) hours as needed for severe pain (pain score 7-10) or breakthrough pain.  Patient not taking: Reported on 12/23/2023   Schulz, Zachary R, PA-C  Active   oxyCODONE  (ROXICODONE ) 5 MG immediate release tablet [id number]  Take 1 tablet (5 mg total) by mouth every 8 (eight) hours as needed.  Patient not taking: Reported on 12/23/2023   Marinda Jayson KIDD, MD  Active   PARoxetine (PAXIL) 10 MG tablet [id number]  TAKE 1 & 1 2 (ONE & ONE HALF)  TABLETS BY MOUTH ONCE DAILY [provider]  Active Self  polyethylene glycol powder (MIRALAX ) 17 GM/SCOOP powder [id number]  Take 17 g by mouth daily. Vaillancourt, Samantha, PA-C  Active Self  traZODone  (DESYREL ) 50 MG tablet [id number]  TAKE 1 TABLET BY MOUTH EVERYDAY AT BEDTIME Bair, Kalpana, MD  Active   Vitamin D , Ergocalciferol , (DRISDOL ) 1.25 MG (50000 UNIT) CAPS capsule [id number]  Take 1 capsule (50,000 Units total) by mouth every 7 (seven) days. Hope Merle, MD  Active Self            Recommendation:   Specialty provider follow-up Recommended calling her surgeon due to pain in her right sign, loss of appetite and loose stools. Continue Current Plan of Care  Follow Up Plan:   Telephone follow-up in 1 week  Medford Balboa, BSN, RN [place]  VBCI - [facility] RN Care Manager [phone]

## 2023-12-23 NOTE — Patient Instructions (Signed)
 Visit Information  Thank you for taking time to visit with me today. Please don't hesitate to contact me if I can be of assistance to you before our next scheduled telephone appointment.  Our next appointment is by telephone on Tuesday July 22nd at 11:00am  Following is a copy of your care plan:   Goals Addressed   None     Patient verbalizes understanding of instructions and care plan provided today and agrees to view in MyChart. Active MyChart status and patient understanding of how to access instructions and care plan via MyChart confirmed with patient.     The patient has been provided with contact information for the care management team and has been advised to call with any health related questions or concerns.   Please call the care guide team at [phone] if you need to cancel or reschedule your appointment.   Please call the Suicide and Crisis Lifeline: 988 call the [place]  National Suicide Prevention Lifeline: 3041355383 or TTY: 636-801-0584 TTY 231-868-1549) to talk to a trained counselor if you are experiencing a Mental Health or Behavioral Health Crisis or need someone to talk to.  Medford Balboa, BSN, RN [place]  VBCI - [employer] [facility] RN Care Manager [phone]

## 2023-12-30 ENCOUNTER — Other Ambulatory Visit: Payer: Self-pay

## 2023-12-30 DIAGNOSIS — J449 Chronic obstructive pulmonary disease, unspecified: Secondary | ICD-10-CM

## 2023-12-30 NOTE — Transitions of Care (Post Inpatient/ED Visit) (Signed)
 Transition of Care week 4  Visit Note  12/30/2023  Name: Kristen Wong MRN: [id number]          DOB: 1966-02-11  Situation: Patient enrolled in [facility] 30-day program. Visit completed with Ellyn Studer by telephone.   Background:   Past Medical History:  Diagnosis Date   Arthritis of both knees    Atypical chest pain 07/28/2023   COPD (chronic obstructive pulmonary disease) (HCC)    Depression    GERD (gastroesophageal reflux disease)    Heart murmur (asymptomatic)    Hepatic steatosis    Hypertension    Irritable bowel syndrome    Polycythemia    T2DM (type 2 diabetes mellitus) (HCC) 2018   Vitamin B 12 deficiency 07/13/2023    Assessment: Patient Reported Symptoms: Cognitive Cognitive Status: Alert and oriented to person, place, and time, Other: (The patient states she is forgetful at times)      Neurological Neurological Review of Symptoms: No symptoms reported    HEENT HEENT Symptoms Reported: No symptoms reported      Cardiovascular Cardiovascular Symptoms Reported: No symptoms reported    Respiratory Respiratory Symptoms Reported: Other: Other Respiratory Symptoms: wet cough Additional Respiratory Details: The patient does continue to smoke. She is SOB at times and admits to using her inhaler. Respiratory Management Strategies: Adequate rest, Activity, Routine screening, Medication therapy  Endocrine Endocrine Symptoms Reported: No symptoms reported Is patient diabetic?: Yes Is patient checking blood sugars at home?: No Endocrine Comment: The patient states she doesn't have a glucose monitor. She would like a CGM but she is not on insulin  and her insurance may not pay for it. She will discuss this at her provider appointment on 8/7. she is also inconsistently taking her Metformin  because she is not eating much and she doesn't want her blood sugar to drop  Gastrointestinal Gastrointestinal Symptoms Reported: Incontinence, Other Other Gastrointestinal Symptoms: Loose  tools with urgency Additional Gastrointestinal Details: The patient states she has mostily loose stools but does have episodes of constipation. She admits to some episodes of incontinence which she is embarrassed by. Gastrointestinal Management Strategies: Incontinence garment/pad, Medication therapy Gastrointestinal Comment: The patient feels like she might be losing weight but does not have a scale    Genitourinary Genitourinary Symptoms Reported: No symptoms reported Genitourinary Management Strategies: Medication therapy Genitourinary Comment: The patient usually has control over her urine control. On occasion has had some incontinence  Integumentary Integumentary Symptoms Reported: No symptoms reported Skin Comment: The patient states her flank area where she had surgery still causes pain. She contacted her surgeon who stated this was normal despite being four weeks post op. She does not have a follow up appointment for two months and then she will have the stent removed  Musculoskeletal Musculoskelatal Symptoms Reviewed: Joint pain Other Musculoskeletal Symptoms: Her left shoulder has some pain and she states that it is improving. Her pain is more intense at her incision site than her shoulder Musculoskeletal Management Strategies: Medication therapy, Routine screening, Adequate rest Falls in the past year?: No Number of falls in past year: 1 or less Was there an injury with Fall?: No Fall Risk Category Calculator: 0 Patient Fall Risk Level: Low Fall Risk    Psychosocial Psychosocial Symptoms Reported: No symptoms reported         There were no vitals filed for this visit.  Medications Reviewed Today     Reviewed by Moises Reusing, RN (Case Manager) on 12/30/23 at 1134  Med List Status: <None>  Medication Order Taking? Sig Documenting Provider Last Dose Status Informant  acetaminophen  (TYLENOL ) 500 MG tablet [id number]  Take 1,000 mg by mouth every 6 (six) hours as needed.  [provider]  Active   albuterol  (VENTOLIN  HFA) 108 (90 Base) MCG/ACT inhaler [id number]  Inhale 1-2 puffs into the lungs every 6 (six) hours as needed. Hope Merle, MD  Active Self  aspirin EC 81 MG tablet [id number]  Take 81 mg by mouth daily. Swallow whole. [provider]  Active Self  atorvastatin (LIPITOR) 20 MG tablet [id number]  Take 20 mg by mouth daily. [provider]  Active Self  busPIRone  (BUSPAR ) 7.5 MG tablet [id number]  Take 7.5 mg by mouth 2 (two) times daily. [provider]  Active Self  carvedilol  (COREG ) 3.125 MG tablet [id number]  TAKE 1 TABLET BY MOUTH TWICE A DAY WITH A MEAL Hope Merle, MD  Active Self  cetirizine (ZYRTEC) 10 MG tablet [id number]  Take 20 mg by mouth daily. [provider]  Active Self  dapagliflozin  propanediol (FARXIGA ) 10 MG TABS tablet [id number]  Take 1 tablet (10 mg total) by mouth daily before breakfast. Hope Merle, MD  Active Self  etodolac (LODINE) 400 MG tablet [id number]  Take 400 mg by mouth 2 (two) times daily. [provider]  Active Self  fluticasone  (FLONASE ) 50 MCG/ACT nasal spray [id number]  Place 2 sprays into both nostrils daily. [provider]  Active Self  ibuprofen  (ADVIL ,MOTRIN ) 600 MG tablet [id number]  Take 1 tablet (600 mg total) by mouth every 6 (six) hours as needed for headache, moderate pain or cramping. Lake Read, MD  Active Self  metFORMIN  (GLUCOPHAGE ) 500 MG tablet [id number]  Take 2 tablets (1,000 mg total) by mouth 2 (two) times daily with a meal. Bair, Luke, MD  Active Self  naproxen (NAPROSYN) 500 MG tablet [id number]  Take 500 mg by mouth 2 (two) times daily. [provider]  Active Self  omeprazole (PRILOSEC) 40 MG capsule [id number]  TAKE 1 CAPSULE BY MOUTH ONCE DAILY 30 MINUTES BEFORE BREAKFAST [provider]  Active Self  oxyCODONE  (OXY IR/ROXICODONE ) 5 MG immediate release tablet [id number]  Take 1 tablet (5 mg total) by mouth  every 6 (six) hours as needed for severe pain (pain score 7-10) or breakthrough pain.  Patient not taking: Reported on 12/23/2023   Schulz, Zachary R, PA-C  Active   oxyCODONE  (ROXICODONE ) 5 MG immediate release tablet [id number]  Take 1 tablet (5 mg total) by mouth every 8 (eight) hours as needed.  Patient not taking: Reported on 12/23/2023   Marinda Jayson KIDD, MD  Active   PARoxetine (PAXIL) 10 MG tablet [id number]  TAKE 1 & 1 2 (ONE & ONE HALF) TABLETS BY MOUTH ONCE DAILY [provider]  Active Self  polyethylene glycol powder (MIRALAX ) 17 GM/SCOOP powder [id number] Yes Take 17 g by mouth daily.  Patient taking differently: Take 17 g by mouth daily as needed for mild constipation.   Vaillancourt, Samantha, PA-C  Active Self  traZODone  (DESYREL ) 50 MG tablet [id number]  TAKE 1 TABLET BY MOUTH EVERYDAY AT BEDTIME Bair, Kalpana, MD  Active   Vitamin D , Ergocalciferol , (DRISDOL ) 1.25 MG (50000 UNIT) CAPS capsule [id number]  Take 1 capsule (50,000 Units total) by mouth every 7 (seven) days. Hope Merle, MD  Active Self            Recommendation:   Referral to: Pharmacy for assistance with medications due to a lapse in insurance coverage. Continue Current Plan  of Care  Follow Up Plan:   Telephone follow-up in 1 week  Medford Balboa, BSN, RN [place]  VBCI - [facility] RN Care Manager [phone]

## 2023-12-30 NOTE — Patient Instructions (Signed)
 Visit Information  Thank you for taking time to visit with me today. Please don't hesitate to contact me if I can be of assistance to you before our next scheduled telephone appointment.  Our next appointment is by telephone on Tuesday July 29th at 11:00am  Following is a copy of your care plan:   Goals Addressed             This Visit's Progress    VBCI Transitions of Care (TOC) Care Plan       Problems: (reviewed 12/30/23) Recent Hospitalization for treatment of Post surgery from Cholecystectomy and Pain  Goal:  (reviewed 12/30/23) Over the next 30 days, the patient will not experience hospital readmission  Interventions:  (reviewed 12/30/23)  Surgery (Cholecystectomy 12/03/23): Evaluation of current treatment plan related to Post Cholecystectomy with drain placement and Pain surgery reviewed post-operative instructions with patient/caregiver reviewed medications with patient and addressed questions reviewed scheduled provider appointments with patient7/3/25 with Jayson Endow confirmed availability of transportation to all appointments Patient's daughter will take her Reviewed pain management - stay on top of your pain. Medicate every 6 hours and take Tylenol  in between narcotic. - The patient has completed her narcotics (12/16/23) Adequate rest Empty drain when it reaches 30ml and record out put - removed 12/11/23 Change bandage to incision as directed by surgeon - covered. No dressing needed (12/30/23) Take Miralax  daily to avoid constipation - changed to PRN on 12/30/23 due to loose stools  Patient Self Care Activities:  (reviewed 12/30/23) Attend all scheduled provider appointments Call pharmacy for medication refills 3-7 days in advance of running out of medications Call provider office for new concerns or questions  Notify RN Care Manager of [facility] call rescheduling needs Participate in Transition of Care Program/Attend [facility] scheduled calls Perform all self care activities  independently  Take medications as prescribed   12/30/23 - Medicaid is pending due to patient is behind in getting her renewal paperwork to [facility]. She is unable to refill her medications until her Medicaid is renewed. A pharmacy referral was completed today for assistance  Plan:  Telephone follow up appointment with care management team member scheduled for:  July 29th at 11:00am        Patient verbalizes understanding of instructions and care plan provided today and agrees to view in MyChart. Active MyChart status and patient understanding of how to access instructions and care plan via MyChart confirmed with patient.     The patient has been provided with contact information for the care management team and has been advised to call with any health related questions or concerns.   Please call the care guide team at [phone] if you need to cancel or reschedule your appointment.   Please call the Suicide and Crisis Lifeline: 988 call the [place]  National Suicide Prevention Lifeline: (629) 082-9401 or TTY: 814-125-5348 TTY 7035463607) to talk to a trained counselor if you are experiencing a Mental Health or Behavioral Health Crisis or need someone to talk to.  Medford Balboa, BSN, RN [place]  VBCI - [facility] RN Care Manager [phone]

## 2024-01-02 ENCOUNTER — Telehealth: Payer: Self-pay

## 2024-01-02 NOTE — Progress Notes (Signed)
 Complex Care Management Note Care Guide Note  01/02/2024 Name: Kristen Wong MRN: [id number] DOB: 04-Jan-1966   Complex Care Management Outreach Attempts: An unsuccessful telephone outreach was attempted today to offer the patient information about available complex care management services.  Follow Up Plan:  Additional outreach attempts will be made to offer the patient complex care management information and services.   Encounter Outcome:  No Answer  [name]  [facility], [facility] Care Management Assistant Direct Dial: [phone]  Fax: [phone]

## 2024-01-05 ENCOUNTER — Other Ambulatory Visit: Payer: Self-pay

## 2024-01-05 ENCOUNTER — Telehealth: Payer: Self-pay

## 2024-01-05 DIAGNOSIS — K8020 Calculus of gallbladder without cholecystitis without obstruction: Secondary | ICD-10-CM

## 2024-01-05 NOTE — Telephone Encounter (Signed)
 Pulm clearance faxed

## 2024-01-05 NOTE — Telephone Encounter (Signed)
 ERCP scheduled for 03/01/2024

## 2024-01-06 ENCOUNTER — Other Ambulatory Visit: Payer: Self-pay

## 2024-01-06 NOTE — Transitions of Care (Post Inpatient/ED Visit) (Signed)
 Transition of Care week 5  Visit Note  01/06/2024  Name: Kristen Wong MRN: [id number]          DOB: July 18, 1965  Situation: Patient enrolled in [facility] 30-day program. Visit completed with Petrea Frame by telephone.   Background:   Past Medical History:  Diagnosis Date   Arthritis of both knees    Atypical chest pain 07/28/2023   COPD (chronic obstructive pulmonary disease) (HCC)    Depression    GERD (gastroesophageal reflux disease)    Heart murmur (asymptomatic)    Hepatic steatosis    Hypertension    Irritable bowel syndrome    Polycythemia    T2DM (type 2 diabetes mellitus) (HCC) 2018   Vitamin B 12 deficiency 07/13/2023    Assessment: Patient Reported Symptoms: Cognitive Cognitive Status: Alert and oriented to person, place, and time      Neurological Neurological Review of Symptoms: No symptoms reported    HEENT HEENT Symptoms Reported: No symptoms reported      Cardiovascular Cardiovascular Symptoms Reported: No symptoms reported Cardiovascular Comment: The patient is unsure of her weight because she does not have a scale  Respiratory Respiratory Symptoms Reported: Other:, Productive cough Other Respiratory Symptoms: wet cough that is at times productive Additional Respiratory Details: The patient continues to smoke. She is unable to afford her inhaler Breztri . A referral has been made to the pharmacy. The patient has also let her insurance lapse and she is waiting for Medicaid to inform her when she is elgibile. She is currently purchasing her meds using [facility] Respiratory Management Strategies: Adequate rest, Activity, Routine screening, Medication therapy  Endocrine Endocrine Symptoms Reported: No symptoms reported Is patient diabetic?: Yes Is patient checking blood sugars at home?: No    Gastrointestinal Gastrointestinal Symptoms Reported: Incontinence, Other Other Gastrointestinal Symptoms: Loose stools. She states this has not improved much Gastrointestinal  Management Strategies: Incontinence garment/pad, Medication therapy Gastrointestinal Comment: the patient states she is experimenting with different foods to eat that won't upset her stomach    Genitourinary Genitourinary Symptoms Reported: No symptoms reported Genitourinary Management Strategies: Medication therapy Genitourinary Comment: Occasional incontinence  Integumentary Integumentary Symptoms Reported: No symptoms reported Skin Comment: She has pain to surgery site. She has a stent in place. She does not see the surgeon until September  Musculoskeletal Musculoskelatal Symptoms Reviewed: Joint pain Other Musculoskeletal Symptoms: Left shoulder pain Musculoskeletal Management Strategies: Medication therapy, Routine screening, Adequate rest Musculoskeletal Comment: Awaiting appointment with Dr. Abbey      Psychosocial Psychosocial Symptoms Reported: No symptoms reported         There were no vitals filed for this visit.  Medications Reviewed Today     Reviewed by Moises Reusing, RN (Case Manager) on 01/06/24 at 1124  Med List Status: <None>   Medication Order Taking? Sig Documenting Provider Last Dose Status Informant  acetaminophen  (TYLENOL ) 500 MG tablet [id number] Yes Take 1,000 mg by mouth every 6 (six) hours as needed. [provider]  Active   albuterol  (VENTOLIN  HFA) 108 (90 Base) MCG/ACT inhaler [id number] Yes Inhale 1-2 puffs into the lungs every 6 (six) hours as needed. Hope Merle, MD  Active Self  aspirin EC 81 MG tablet [id number] Yes Take 81 mg by mouth daily. Swallow whole. [provider]  Active Self  atorvastatin (LIPITOR) 20 MG tablet [id number] Yes Take 20 mg by mouth daily. [provider]  Active Self  busPIRone  (BUSPAR ) 7.5 MG tablet [id number] Yes Take 7.5 mg by mouth 2 (two) times  daily. [provider]  Active Self  carvedilol  (COREG ) 3.125 MG tablet [id number] Yes TAKE 1 TABLET BY MOUTH TWICE A DAY WITH A MEAL Hope Merle, MD  Active Self  cetirizine (ZYRTEC) 10 MG tablet [id number] Yes Take 20 mg by mouth daily. [provider]  Active Self  dapagliflozin  propanediol (FARXIGA ) 10 MG TABS tablet [id number] Yes Take 1 tablet (10 mg total) by mouth daily before breakfast. Hope Merle, MD  Active Self  etodolac (LODINE) 400 MG tablet [id number] Yes Take 400 mg by mouth 2 (two) times daily. [provider]  Active Self  fluticasone  (FLONASE ) 50 MCG/ACT nasal spray [id number] Yes Place 2 sprays into both nostrils daily. [provider]  Active Self  ibuprofen  (ADVIL ,MOTRIN ) 600 MG tablet [id number] Yes Take 1 tablet (600 mg total) by mouth every 6 (six) hours as needed for headache, moderate pain or cramping. Lake Read, MD  Active Self  metFORMIN  (GLUCOPHAGE ) 500 MG tablet [id number] Yes Take 2 tablets (1,000 mg total) by mouth 2 (two) times daily with a meal. Bair, Kalpana, MD  Active Self  naproxen (NAPROSYN) 500 MG tablet [id number] Yes Take 500 mg by mouth 2 (two) times daily. [provider]  Active Self  omeprazole (PRILOSEC) 40 MG capsule [id number] Yes TAKE 1 CAPSULE BY MOUTH ONCE DAILY 30 MINUTES BEFORE BREAKFAST [provider]  Active Self  oxyCODONE  (OXY IR/ROXICODONE ) 5 MG immediate release tablet [id number] Yes Take 1 tablet (5 mg total) by mouth every 6 (six) hours as needed for severe pain (pain score 7-10) or breakthrough pain. Schulz, Zachary R, PA-C  Active   oxyCODONE  (ROXICODONE ) 5 MG immediate release tablet [id number] Yes Take 1 tablet (5 mg total) by mouth every 8 (eight) hours as needed. Marinda Jayson KIDD, MD  Active   PARoxetine (PAXIL) 10 MG tablet [id number] Yes TAKE 1 & 1 2 (ONE & ONE HALF) TABLETS BY MOUTH ONCE DAILY [provider]  Active Self  polyethylene glycol powder (MIRALAX ) 17 GM/SCOOP powder [id number] Yes Take 17 g by mouth daily.  Patient taking differently: Take 17 g by mouth daily as needed for mild constipation.    Vaillancourt, Samantha, PA-C  Active Self  traZODone  (DESYREL ) 50 MG tablet [id number] Yes TAKE 1 TABLET BY MOUTH EVERYDAY AT BEDTIME Bair, Kalpana, MD  Active   Vitamin D , Ergocalciferol , (DRISDOL ) 1.25 MG (50000 UNIT) CAPS capsule [id number] Yes Take 1 capsule (50,000 Units total) by mouth every 7 (seven) days. Hope Merle, MD  Active Self            Recommendation:   PCP Follow-up Continue Current Plan of Care  Follow Up Plan:   Telephone follow-up in 1 week  Medford Balboa, BSN, RN [place]  VBCI - [facility] RN Care Manager [phone]

## 2024-01-06 NOTE — Patient Instructions (Signed)
 Visit Information  Thank you for taking time to visit with me today. Please don't hesitate to contact me if I can be of assistance to you before our next scheduled telephone appointment.  Our next appointment is by telephone on Friday August 8th at 11:00am  Following is a copy of your care plan:   Goals Addressed             This Visit's Progress    VBCI Transitions of Care (TOC) Care Plan       Problems: (reviewed 01/06/24) Recent Hospitalization for treatment of Post surgery from Cholecystectomy and Pain  Goal:  (reviewed 01/06/24) Over the next 30 days, the patient will not experience hospital readmission  Interventions:  (reviewed 01/06/24)  Surgery (Cholecystectomy 12/03/23): Evaluation of current treatment plan related to Post Cholecystectomy with drain placement and Pain surgery reviewed post-operative instructions with patient/caregiver reviewed medications with patient and addressed questions reviewed scheduled provider appointments with patient7/3/25 with Jayson Endow confirmed availability of transportation to all appointments Patient's daughter will take her Reviewed pain management - stay on top of your pain. Medicate every 6 hours and take Tylenol  in between narcotic. - The patient has completed her narcotics (12/16/23) Adequate rest Empty drain when it reaches 30ml and record out put - removed 12/11/23 Change bandage to incision as directed by surgeon - covered. No dressing needed (12/30/23) Take Miralax  daily to avoid constipation - changed to PRN on 12/30/23 due to loose stools  Patient Self Care Activities:  (reviewed 12/30/23) Attend all scheduled provider appointments Call pharmacy for medication refills 3-7 days in advance of running out of medications Call provider office for new concerns or questions  Notify RN Care Manager of [facility] call rescheduling needs Participate in Transition of Care Program/Attend [facility] scheduled calls Perform all self care activities  independently  Take medications as prescribed   12/30/23 - Medicaid is pending due to patient is behind in getting her renewal paperwork to [facility]. She is unable to refill her medications until her Medicaid is renewed. A pharmacy referral was completed today for assistance 01/06/24 - The patient is unable to refill a prescription due to cost. The patient missed a phone call from CCM trying to coordinate services PCP appointment 01/15/24  Plan:  Telephone follow up appointment with care management team member scheduled for:  Friday August 8th at 11:00am        Patient verbalizes understanding of instructions and care plan provided today and agrees to view in [place]. Active MyChart status and patient understanding of how to access instructions and care plan via MyChart confirmed with patient.     The patient has been provided with contact information for the care management team and has been advised to call with any health related questions or concerns.   Please call the care guide team at [phone] if you need to cancel or reschedule your appointment.   Please call the Suicide and Crisis Lifeline: 988 call the [place]  National Suicide Prevention Lifeline: (404)016-4712 or TTY: 551 351 3372 TTY (251) 885-4888) to talk to a trained counselor if you are experiencing a Mental Health or Behavioral Health Crisis or need someone to talk to.  Medford Balboa, BSN, RN [place]  VBCI - [employer] [facility] RN Care Manager [phone]

## 2024-01-06 NOTE — Progress Notes (Signed)
 Complex Care Management Note  Care Guide Note 01/06/2024 Name: CELENE PIPPINS MRN: [id number] DOB: 09/25/1965  MARDENE LESSIG is a 58 y.o. year old female who sees Bair, Kalpana, MD for primary care. I reached out to Skilar M [facility] by phone today to offer complex care management services.  Ms. Lapierre was given information about Complex Care Management services today including:   The Complex Care Management services include support from the care team which includes your Nurse Care Manager, Clinical Social Worker, or Pharmacist.  The Complex Care Management team is here to help remove barriers to the health concerns and goals most important to you. Complex Care Management services are voluntary, and the patient may decline or stop services at any time by request to their care team member.   Complex Care Management Consent Status: Patient agreed to services and verbal consent obtained.   Follow up plan:  Telephone appointment with complex care management team member scheduled for:  01/09/24 at 11:00 a.m.   Encounter Outcome:  Patient Scheduled  [name]  [facility], [facility] Care Management Assistant Direct Dial: [phone]  Fax: [phone]

## 2024-01-07 NOTE — Telephone Encounter (Signed)
Clearance faxed x 2 

## 2024-01-09 ENCOUNTER — Other Ambulatory Visit: Payer: Self-pay | Admitting: Pharmacist

## 2024-01-09 DIAGNOSIS — I152 Hypertension secondary to endocrine disorders: Secondary | ICD-10-CM

## 2024-01-09 DIAGNOSIS — E1159 Type 2 diabetes mellitus with other circulatory complications: Secondary | ICD-10-CM

## 2024-01-09 NOTE — Progress Notes (Signed)
   01/09/2024 Name: Kristen Wong MRN: [id number] DOB: Jun 06, 1966  Subjective  Chief Complaint  Patient presents with   Medication Access    Care Team: Primary Care Provider: Bair, Kalpana, MD  Reason for visit: ?  Kristen Wong is a 58 y.o. female who presents today for a telephone visit with the pharmacist due to medication access concerns. ?   Medication Access: ?  Reports that all medications are not affordable.  Has applied for Medicaid though she notes her benefits have not kicked in yet. Reports it is unclear when her coverage will start.   States she has gone to [facility] recently. They told her they are short case workers and are behind on applications.   Daughter helps to manage all medications for her. Daughter has also helped patient obtain refills for all medications recently. Is not out of any medications at present time. Only medication that is too expensive = ventolin . Has Breztri  samples at home.   Current Patient Assistance: N/A  Assessment and Plan:   1. Medication Access Currently has new refills of all of her medications which her daughter picked up for her.  Discussed for future refills via [facility]/DOH if she still has not heard from [facility]:  Atorvastatin 20 mg, buspirone  15 mg (1/2 tablet, 7.5 mg), metformin  500 mg, trazodone  100 mg (1/2 tablet, 50 mg) Paroxetine = Beer's criteria. Reasonable to consider safer alternative on [facility] list if appropriate = sertraline, escitalopram  Future Appointments  Date Time Provider Department Center  01/15/2024  2:00 PM Bair, Kalpana, MD [facility]  01/16/2024 11:00 AM Homsher, Wanda, RN [facility]POP[facility] None  01/26/2024 11:20 AM Perla Evalene PARAS, MD CVD-BURL None  02/02/2024 10:00 AM L[facility] PFT RM L[facility]U-[facility] None  02/02/2024 11:00 AM Malachy Comer GAILS, NP LBPU-BURL None  08/25/2024 10:00 AM Maurine Lukes, PA-C BUA-BUA None    Manuelita FABIENE Kobs, PharmD Clinical Pharmacist [facility] [phone]

## 2024-01-09 NOTE — Patient Instructions (Signed)
 Ms. Kristen Wong,   It was a pleasure to speak with you today! As we discussed:?   Medication that me be available   [place] has a pharmacy that runs a program to help patients without insurance. This program can often provide at least some of your medications for free while we await Medicaid approval.  Most recently, they have had your atorvastatin, buspirone , metformin  and trazodone .   To enroll in the program, you would just present to the pharmacy below and they will have you sign the enrollment form.   [place] Location: [facility] [address] [place], [place]  [address] Ph: [phone] Hours: 7:30 AM - 6 PM Mon-Fri   Please reach out prior to your next scheduled appointment should you have any questions or concerns.  You may respond directly to this message, or leave me a voicemail at [phone] and I will get back to you shortly.   Thank you!   Future Appointments  Date Time Provider Department Center  01/15/2024  2:00 PM Bair, Kalpana, MD [facility]  01/16/2024 11:00 AM Homsher, Wanda, RN CHL-POP[facility] None  01/26/2024 11:20 AM Perla Evalene PARAS, MD CVD-BURL None  02/02/2024 10:00 AM L[facility] PFT RM L[facility]U-BURL None  02/02/2024 11:00 AM Malachy Comer GAILS, NP LBPU-BURL None  08/25/2024 10:00 AM Maurine Lukes, PA-C BUA-BUA None    Manuelita FABIENE Kobs, PharmD Clinical Pharmacist [facility] [phone]

## 2024-01-15 ENCOUNTER — Other Ambulatory Visit: Payer: Self-pay

## 2024-01-15 ENCOUNTER — Ambulatory Visit (INDEPENDENT_AMBULATORY_CARE_PROVIDER_SITE_OTHER)

## 2024-01-15 VITALS — BP 140/80 | HR 105 | Temp 98.0°F | Ht 64.0 in | Wt 196.8 lb

## 2024-01-15 DIAGNOSIS — I152 Hypertension secondary to endocrine disorders: Secondary | ICD-10-CM | POA: Diagnosis not present

## 2024-01-15 DIAGNOSIS — F39 Unspecified mood [affective] disorder: Secondary | ICD-10-CM

## 2024-01-15 DIAGNOSIS — J432 Centrilobular emphysema: Secondary | ICD-10-CM

## 2024-01-15 DIAGNOSIS — E1169 Type 2 diabetes mellitus with other specified complication: Secondary | ICD-10-CM

## 2024-01-15 DIAGNOSIS — K219 Gastro-esophageal reflux disease without esophagitis: Secondary | ICD-10-CM

## 2024-01-15 DIAGNOSIS — D72819 Decreased white blood cell count, unspecified: Secondary | ICD-10-CM

## 2024-01-15 DIAGNOSIS — E538 Deficiency of other specified B group vitamins: Secondary | ICD-10-CM | POA: Diagnosis not present

## 2024-01-15 DIAGNOSIS — E785 Hyperlipidemia, unspecified: Secondary | ICD-10-CM

## 2024-01-15 DIAGNOSIS — F17200 Nicotine dependence, unspecified, uncomplicated: Secondary | ICD-10-CM

## 2024-01-15 DIAGNOSIS — E049 Nontoxic goiter, unspecified: Secondary | ICD-10-CM

## 2024-01-15 DIAGNOSIS — E1159 Type 2 diabetes mellitus with other circulatory complications: Secondary | ICD-10-CM | POA: Diagnosis not present

## 2024-01-15 DIAGNOSIS — Z1231 Encounter for screening mammogram for malignant neoplasm of breast: Secondary | ICD-10-CM

## 2024-01-15 DIAGNOSIS — G4700 Insomnia, unspecified: Secondary | ICD-10-CM

## 2024-01-15 DIAGNOSIS — E559 Vitamin D deficiency, unspecified: Secondary | ICD-10-CM

## 2024-01-15 DIAGNOSIS — K8020 Calculus of gallbladder without cholecystitis without obstruction: Secondary | ICD-10-CM

## 2024-01-15 DIAGNOSIS — Z7984 Long term (current) use of oral hypoglycemic drugs: Secondary | ICD-10-CM | POA: Diagnosis not present

## 2024-01-15 DIAGNOSIS — D72828 Other elevated white blood cell count: Secondary | ICD-10-CM

## 2024-01-15 DIAGNOSIS — E118 Type 2 diabetes mellitus with unspecified complications: Secondary | ICD-10-CM

## 2024-01-15 MED ORDER — TELMISARTAN 20 MG PO TABS
20.0000 mg | ORAL_TABLET | Freq: Every day | ORAL | 3 refills | Status: DC
  Filled 2024-01-15 – 2024-01-21 (×3): qty 90, 90d supply, fill #0
  Filled 2024-03-29: qty 30, 30d supply, fill #0
  Filled 2024-04-26 – 2024-05-15 (×3): qty 30, 30d supply, fill #1

## 2024-01-15 MED ORDER — CARVEDILOL 3.125 MG PO TABS
3.1250 mg | ORAL_TABLET | Freq: Two times a day (BID) | ORAL | 3 refills | Status: AC
  Filled 2024-01-15: qty 180, 90d supply, fill #0
  Filled 2024-03-19 – 2024-03-29 (×2): qty 180, 90d supply, fill #1
  Filled 2024-07-09: qty 180, 90d supply, fill #2

## 2024-01-15 MED ORDER — METFORMIN HCL 500 MG PO TABS
1000.0000 mg | ORAL_TABLET | Freq: Two times a day (BID) | ORAL | 1 refills | Status: DC
  Filled 2024-01-15: qty 60, 15d supply, fill #0
  Filled 2024-03-19: qty 60, 15d supply, fill #1

## 2024-01-15 MED ORDER — BUSPIRONE HCL 7.5 MG PO TABS
7.5000 mg | ORAL_TABLET | Freq: Two times a day (BID) | ORAL | 3 refills | Status: AC
  Filled 2024-01-15: qty 180, 90d supply, fill #0
  Filled 2024-01-16: qty 60, 30d supply, fill #0
  Filled 2024-03-19: qty 60, 30d supply, fill #1
  Filled 2024-03-29 – 2024-04-14 (×2): qty 60, 30d supply, fill #2
  Filled 2024-05-15 – 2024-06-15 (×2): qty 60, 30d supply, fill #3

## 2024-01-15 MED ORDER — ESCITALOPRAM OXALATE 10 MG PO TABS
10.0000 mg | ORAL_TABLET | Freq: Every day | ORAL | 3 refills | Status: AC
  Filled 2024-01-15: qty 90, 90d supply, fill #0
  Filled 2024-03-19 – 2024-03-29 (×2): qty 90, 90d supply, fill #1
  Filled 2024-07-01: qty 90, 90d supply, fill #2

## 2024-01-15 MED ORDER — OMEPRAZOLE 40 MG PO CPDR
40.0000 mg | DELAYED_RELEASE_CAPSULE | Freq: Every day | ORAL | 3 refills | Status: AC
  Filled 2024-01-15: qty 90, 90d supply, fill #0
  Filled 2024-03-19 – 2024-03-29 (×2): qty 90, 90d supply, fill #1
  Filled 2024-07-09: qty 90, 90d supply, fill #2

## 2024-01-15 MED ORDER — VITAMIN D (ERGOCALCIFEROL) 1.25 MG (50000 UNIT) PO CAPS
50000.0000 [IU] | ORAL_CAPSULE | ORAL | 0 refills | Status: DC
  Filled 2024-01-15: qty 12, 84d supply, fill #0

## 2024-01-15 MED ORDER — TRAZODONE HCL 50 MG PO TABS
50.0000 mg | ORAL_TABLET | Freq: Every evening | ORAL | 3 refills | Status: AC | PRN
  Filled 2024-01-15: qty 90, 90d supply, fill #0
  Filled 2024-04-26: qty 90, 90d supply, fill #1
  Filled 2024-06-28: qty 90, 90d supply, fill #2
  Filled 2024-06-30: qty 30, 30d supply, fill #2
  Filled 2024-07-09: qty 30, 30d supply, fill #3

## 2024-01-15 MED ORDER — DAPAGLIFLOZIN PROPANEDIOL 10 MG PO TABS
10.0000 mg | ORAL_TABLET | Freq: Every day | ORAL | 3 refills | Status: DC
  Filled 2024-01-15 – 2024-01-28 (×6): qty 90, 90d supply, fill #0
  Filled 2024-04-26: qty 90, 90d supply, fill #1

## 2024-01-15 MED ORDER — ATORVASTATIN CALCIUM 20 MG PO TABS
20.0000 mg | ORAL_TABLET | Freq: Every day | ORAL | 3 refills | Status: AC
  Filled 2024-01-15: qty 90, 90d supply, fill #0
  Filled 2024-03-19 – 2024-03-29 (×2): qty 90, 90d supply, fill #1
  Filled 2024-07-01: qty 90, 90d supply, fill #2

## 2024-01-15 NOTE — Assessment & Plan Note (Signed)
 Currently on Paxil 10mg  daily. D/C Paxil, start Escitalopram  10 mg daily, continue Buspar  7.5 mg BID. F/U in 4 months or sooner.

## 2024-01-15 NOTE — Progress Notes (Signed)
 Established Patient Office Visit TOC from [facility] follow up from 12/03/23    Subjective  Patient ID: Kristen Wong, female    DOB: 26-Jun-1965  Age: 58 y.o. MRN: [id number]  Chief Complaint  Patient presents with   [facility], daughter with patient    Hospitalization Follow-up    She  has a past medical history of Acute cholecystitis (12/03/2023), Arthritis of both knees, Atypical chest pain (07/28/2023), Bile duct leak (12/04/2023), Calculus of gallbladder without cholecystitis without obstruction (10/28/2023), COPD (chronic obstructive pulmonary disease) (HCC), Depression, GERD (gastroesophageal reflux disease), Heart murmur (asymptomatic), Hepatic steatosis, Hypertension, Irritable bowel syndrome, Polycythemia, T2DM (type 2 diabetes mellitus) (HCC) (2018), and Vitamin B 12 deficiency (07/13/2023).  HPI Discussed the use of AI scribe software for clinical note transcription with the patient, who gave verbal consent to proceed.  History of Present Illness Kristen Wong is a 58 year old female who presents for TOC, medication management and follow-up after biliary surgery.   She underwent biliary surgery on December 03, 2023, including an ERCP, biliary stent placement, and total cholecystectomy. Post-surgery, she experienced a bile leak requiring a drain, which has been removed. She has persistent pain in the surgical area radiating to her back. She is scheduled for a follow-up procedure to address the stent on February 10, 2024. No fever, chills, nausea, or vomiting since surgery.  She has a history of diabetes, which has been difficult to control, with previous episodes of hyperglycemia affecting wound healing. She is currently on metformin , taking two tablets twice a day, and has lost weight, now weighing 196 pounds. She has made dietary changes, avoiding high carbohydrate foods and sodas, and consuming mainly coffee, tea, or water.  She experiences anxiety and  depression, for which she takes Buspar  daily. She reports stress related to insurance issues and continues to smoke, attributing it to her nerves. She is also on trazodone  for sleep, which she finds necessary to avoid daytime sleepiness.  Her medication regimen includes Tylenol  for pain, albuterol  for breathing issues, and she is awaiting a prescription for Lipitor to manage her cholesterol. She is not taking ibuprofen  and uses naproxen for arm and back pain. She is also on Farxiga  for diabetes and kidney protection, but insurance issues have affected her access to this medication.  She has a history of high blood pressure, for which she takes carvedilol . She has not been monitoring her blood pressure at home recently. She also takes omeprazole  for acid reduction and a daily baby aspirin.     ROS As per HPI    Objective:     BP (!) 140/80 (BP Location: Right Arm, Cuff Size: Normal)   Pulse (!) 105   Temp 98 F (36.7 C) (Oral)   Ht 5' 4 (1.626 m)   Wt 196 lb 12.8 oz (89.3 kg)   SpO2 95%   BMI 33.78 kg/m      01/15/2024    2:57 PM 10/28/2023    9:13 AM 07/28/2023    3:40 PM  Depression screen PHQ 2/9  Decreased Interest 3 3 0  Down, Depressed, Hopeless 3 3 2   PHQ - 2 Score 6 6 2   Altered sleeping 3 3 3   Tired, decreased energy 3 3 3   Change in appetite 3 2 3   Feeling bad or failure about yourself  2 2 2   Trouble concentrating 0 0 0  Moving slowly or fidgety/restless 0 3 0  Suicidal thoughts 0 0  0  PHQ-9 Score 17 19 13   Difficult doing work/chores Somewhat difficult  Not difficult at all      01/15/2024    2:57 PM 10/28/2023    9:13 AM 07/28/2023    3:40 PM 07/01/2023   11:23 AM  GAD 7 : Generalized Anxiety Score  Nervous, Anxious, on Edge 3 1 2 1   Control/stop worrying 2 3 3 2   Worry too much - different things 2 2 3 2   Trouble relaxing 2 3 1 1   Restless 1 1 0 1  Easily annoyed or irritable 2 1 0 1  Afraid - awful might happen 1 3 2 1   Total GAD 7 Score 13 14 11 9    Anxiety Difficulty Somewhat difficult Somewhat difficult Somewhat difficult Somewhat difficult      01/15/2024    2:57 PM 10/28/2023    9:13 AM 07/28/2023    3:40 PM  Depression screen PHQ 2/9  Decreased Interest 3 3 0  Down, Depressed, Hopeless 3 3 2   PHQ - 2 Score 6 6 2   Altered sleeping 3 3 3   Tired, decreased energy 3 3 3   Change in appetite 3 2 3   Feeling bad or failure about yourself  2 2 2   Trouble concentrating 0 0 0  Moving slowly or fidgety/restless 0 3 0  Suicidal thoughts 0 0 0  PHQ-9 Score 17 19 13   Difficult doing work/chores Somewhat difficult  Not difficult at all      01/15/2024    2:57 PM 10/28/2023    9:13 AM 07/28/2023    3:40 PM 07/01/2023   11:23 AM  GAD 7 : Generalized Anxiety Score  Nervous, Anxious, on Edge 3 1 2 1   Control/stop worrying 2 3 3 2   Worry too much - different things 2 2 3 2   Trouble relaxing 2 3 1 1   Restless 1 1 0 1  Easily annoyed or irritable 2 1 0 1  Afraid - awful might happen 1 3 2 1   Total GAD 7 Score 13 14 11 9   Anxiety Difficulty Somewhat difficult Somewhat difficult Somewhat difficult Somewhat difficult   SDOH Screenings   Food Insecurity: No Food Insecurity (12/08/2023)  Housing: Unknown (12/08/2023)  Transportation Needs: No Transportation Needs (12/08/2023)  Utilities: Not At Risk (12/08/2023)  Depression (PHQ2-9): High Risk (01/15/2024)  Financial Resource Strain: Low Risk  (04/16/2023)   Received from [facility]  Physical Activity: Inactive (09/17/2018)   Received from [facility]  Social Connections: Moderately Isolated (09/17/2018)   Received from [facility]  Stress: Stress Concern Present (09/17/2018)   [facility]  Tobacco Use: High Risk (01/15/2024)     Physical Exam Constitutional:      Appearance: Normal appearance. She is obese. She is not toxic-appearing.  HENT:     Head: Normocephalic and atraumatic.     Right Ear: Tympanic  membrane normal.     Left Ear: Tympanic membrane normal.     Mouth/Throat:     Mouth: Mucous membranes are moist.     Comments: Poor dentition and angular cheilitis noted   Neck:     Thyroid: No thyroid mass or thyroid tenderness.  Cardiovascular:     Rate and Rhythm: Normal rate and regular rhythm.  Pulmonary:     Effort: Pulmonary effort is normal.     Breath sounds: Normal breath sounds. No wheezing.  Abdominal:     General: Abdomen is protuberant. Bowel sounds  are normal.     Palpations: Abdomen is soft.     Tenderness: There is abdominal tenderness (on deep palpation over all quadrant, generalized without guarding, rebound). There is no right CVA tenderness, left CVA tenderness or guarding.  Musculoskeletal:     Cervical back: Neck supple. No rigidity.     Right lower leg: No edema.     Left lower leg: No edema.  Skin:    General: Skin is warm.  Neurological:     Mental Status: She is alert and oriented to person, place, and time.     Gait: Gait abnormal (slow, broad gait).  Psychiatric:        Mood and Affect: Mood normal.        Behavior: Behavior normal.        No results found for any visits on 01/15/24.  The ASCVD Risk score (Arnett DK, et al., 2019) failed to calculate for the following reasons:   Cannot find a previous HDL lab   Cannot find a previous total cholesterol lab     Assessment & Plan:  Patient is a pleasant 58 year old female with h/o DM (with complications including hyperglycemia, hyperlipidemia), recent cholecystectomy with complications, ongoing tobacco use presenting for TC, hospital follow up.   I recommend patient reach out to surgeon's office if abdominal pain persists specially in the context of recent surgery with complication.   Type 2 diabetes mellitus with complications (HCC) Assessment & Plan: Check A1c, urine microalbumin. Patient will schedule an eye appointment once she has insurance.  Will adjust medication based on lab  result.  Refill on Metformin  500 mg (2tab, BID) sent.  Continue Farxiga  10 mg daily.     Orders: -     Dapagliflozin  Propanediol; Take 1 tablet (10 mg total) by mouth daily before breakfast.  Dispense: 90 tablet; Refill: 3 -     metFORMIN  HCl; Take 2 tablets (1,000 mg total) by mouth 2 (two) times daily with a meal.  Dispense: 60 tablet; Refill: 1 -     Hemoglobin A1c -     Microalbumin / creatinine urine ratio  Hypertension associated with diabetes (HCC) Assessment & Plan: BP not within goal. No cheat pain, palpitations, lower leg edema. Recommend adding Telmisartan  20 mg once a day to current Carvedilol  BID regime to help with BP control. Check CMP.   Orders: -     Carvedilol ; Take 1 tablet (3.125 mg total) by mouth 2 (two) times daily with a meal.  Dispense: 180 tablet; Refill: 3 -     Comprehensive metabolic panel with GFR -     Telmisartan ; Take 1 tablet (20 mg total) by mouth daily.  Dispense: 90 tablet; Refill: 3  Centrilobular emphysema (HCC) Assessment & Plan: Continue f/u with pulmonology, no chest pain, cough, wheezing. Continues to smoke cigarettes and she is not keen or ready to quit smoking at this time.    Vitamin D  deficiency Assessment & Plan: Check vitamin D , continue once weekly vitamin D  50000 units, refill sent.   Orders: -     Vitamin D  (Ergocalciferol ); Take 1 capsule (50,000 Units total) by mouth every 7 (seven) days.  Dispense: 12 capsule; Refill: 0 -     VITAMIN D  25 Hydroxy (Vit-D Deficiency, Fractures)  Insomnia, unspecified type Assessment & Plan: Taking Trazodone  50 mg at bedtime with some improvement in symptoms, continue  Orders: -     traZODone  HCl; Take 1 tablet (50 mg total) by mouth at bedtime as needed  for sleep.  Dispense: 90 tablet; Refill: 3  Leukopenia, unspecified type -     CBC with Differential/Platelet  Vitamin B 12 deficiency Assessment & Plan: Check B 12. Patient with angular cheilitis on exam. Recommend she starts taking  daily multivitamin, balanced diet.   Orders: -     Vitamin B12  Tobacco dependence with current use Assessment & Plan: Patient continues to smoke cigarettes and is not ready to quit smoking. Will continue to f/u and provide smoking cessation counseling.    Breast cancer screening by mammogram Assessment & Plan: Screening mammogram ordered. Patient will call to schedule an appointment for screening mammogram  Orders: -     3D Screening Mammogram, Left and Right; Future  Hyperlipidemia associated with type 2 diabetes mellitus (HCC) Assessment & Plan: Patient ran out of prescription of Atorvastatin  20 mg, refill sent, medication adherence importance counseling provided.   Orders: -     Atorvastatin  Calcium ; Take 1 tablet (20 mg total) by mouth daily.  Dispense: 90 tablet; Refill: 3  Other elevated white blood cell (WBC) count Assessment & Plan: Likely multifactorial (smoking, recent surgery)recommend repeat CBC   Mood disorder (HCC) Assessment & Plan: Currently on Paxil 10mg  daily. D/C Paxil, start Escitalopram  10 mg daily, continue Buspar  7.5 mg BID. F/U in 4 months or sooner.    Orders: -     busPIRone  HCl; Take 1 tablet (7.5 mg total) by mouth 2 (two) times daily.  Dispense: 180 tablet; Refill: 3 -     Escitalopram  Oxalate; Take 1 tablet (10 mg total) by mouth daily.  Dispense: 90 tablet; Refill: 3  Gastroesophageal reflux disease, unspecified whether esophagitis present Assessment & Plan: Symptoms stable with Omeprazole  40 mg, once a day, continue   Orders: -     Omeprazole ; Take 1 capsule (40 mg total) by mouth daily.  Dispense: 90 capsule; Refill: 3   I personally spent a total of 50 minutes in the care of the patient today including preparing to see the patient, performing a medically appropriate exam/evaluation, counseling and educating, placing orders, referring and communicating with other health care professionals, documenting clinical information in the EHR,  independently interpreting results, communicating results, and coordinating care (with VBCI team including reviewing nursing, pharmacy notes).  Return in about 4 months (around 05/16/2024) for Diabetes follow up .   Luke Shade, MD

## 2024-01-15 NOTE — Assessment & Plan Note (Addendum)
 Continue f/u with pulmonology, no chest pain, cough, wheezing. Continues to smoke cigarettes and she is not keen or ready to quit smoking at this time.

## 2024-01-15 NOTE — Assessment & Plan Note (Signed)
 Patient ran out of prescription of Atorvastatin  20 mg, refill sent, medication adherence importance counseling provided.

## 2024-01-15 NOTE — Assessment & Plan Note (Signed)
 Check vitamin D , continue once weekly vitamin D  50000 units, refill sent.

## 2024-01-15 NOTE — Assessment & Plan Note (Addendum)
 Likely multifactorial (smoking, recent surgery)recommend repeat CBC

## 2024-01-15 NOTE — Patient Instructions (Addendum)
 [place] [facility]: Center, [place] [facility], [address], [place], [place] [address] Phone: [phone]   Your are due for Shingles, hepatitis B vaccine, please talk to your pharmacist to see if you can get this updated. I also recommend updating flu shot around September and updating RSV vaccine as well.   Whenever you are ready for mammogram please call the following number to schedule, I have already ordered the mammogram.   I am discontinuing Paroxetine and I am starting you on new medication called Escitalopram  10 mg and continue to take Buspar , twice a day.   I am starting you on medication called Telmisartan  20 mg, please take this once a day. This will help with blood pressure and improving in kidney function as well.   Continue Farxiga  10 mg, this helps with diabetes plus protects your kidney's health.   YOUR MAMMOGRAM IS DUE, PLEASE CALL AND GET THIS SCHEDULED! [facility] - call [phone]

## 2024-01-15 NOTE — Assessment & Plan Note (Signed)
 Patient continues to smoke cigarettes and is not ready to quit smoking. Will continue to f/u and provide smoking cessation counseling.

## 2024-01-15 NOTE — Assessment & Plan Note (Signed)
 Taking Trazodone  50 mg at bedtime with some improvement in symptoms, continue

## 2024-01-15 NOTE — Assessment & Plan Note (Addendum)
 Check A1c, urine microalbumin. Patient will schedule an eye appointment once she has insurance.  Will adjust medication based on lab result.  Refill on Metformin  500 mg (2tab, BID) sent.  Continue Farxiga  10 mg daily.

## 2024-01-15 NOTE — Assessment & Plan Note (Signed)
 Symptoms stable with Omeprazole  40 mg, once a day, continue

## 2024-01-15 NOTE — Assessment & Plan Note (Signed)
 BP not within goal. No cheat pain, palpitations, lower leg edema. Recommend adding Telmisartan  20 mg once a day to current Carvedilol  BID regime to help with BP control. Check CMP.

## 2024-01-15 NOTE — Assessment & Plan Note (Signed)
 Screening mammogram ordered. Patient will call to schedule an appointment for screening mammogram

## 2024-01-15 NOTE — Assessment & Plan Note (Addendum)
 Check B 12. Patient with angular cheilitis on exam. Recommend she starts taking daily multivitamin, balanced diet.

## 2024-01-16 ENCOUNTER — Other Ambulatory Visit: Payer: Self-pay

## 2024-01-16 LAB — COMPREHENSIVE METABOLIC PANEL WITH GFR
ALT: 61 U/L — ABNORMAL HIGH (ref 0–35)
AST: 138 U/L — ABNORMAL HIGH (ref 0–37)
Albumin: 4 g/dL (ref 3.5–5.2)
Alkaline Phosphatase: 97 U/L (ref 39–117)
BUN: 8 mg/dL (ref 6–23)
CO2: 25 meq/L (ref 19–32)
Calcium: 9.3 mg/dL (ref 8.4–10.5)
Chloride: 100 meq/L (ref 96–112)
Creatinine, Ser: 0.5 mg/dL (ref 0.40–1.20)
GFR: 102.56 mL/min (ref >60.00)
Glucose, Bld: 153 mg/dL — ABNORMAL HIGH (ref 70–99)
Potassium: 3.6 meq/L (ref 3.5–5.1)
Sodium: 140 meq/L (ref 135–145)
Total Bilirubin: 0.7 mg/dL (ref 0.2–1.2)
Total Protein: 7.3 g/dL (ref 6.0–8.3)

## 2024-01-16 LAB — VITAMIN B12: Vitamin B-12: 335 pg/mL (ref 211–911)

## 2024-01-16 LAB — HEMOGLOBIN A1C: Hgb A1c MFr Bld: 8.3 % — ABNORMAL HIGH (ref 4.6–6.5)

## 2024-01-16 LAB — CBC WITH DIFFERENTIAL/PLATELET
Basophils Absolute: 0.1 K/uL (ref 0.0–0.1)
Basophils Relative: 1.5 % (ref 0.0–3.0)
Eosinophils Absolute: 0.2 K/uL (ref 0.0–0.7)
Eosinophils Relative: 2.2 % (ref 0.0–5.0)
HCT: 48.1 % — ABNORMAL HIGH (ref 36.0–46.0)
Hemoglobin: 16.1 g/dL — ABNORMAL HIGH (ref 12.0–15.0)
Lymphocytes Relative: 39.4 % (ref 12.0–46.0)
Lymphs Abs: 4 K/uL (ref 0.7–4.0)
MCHC: 33.6 g/dL (ref 30.0–36.0)
MCV: 87.5 fl (ref 78.0–100.0)
Monocytes Absolute: 0.7 K/uL (ref 0.1–1.0)
Monocytes Relative: 6.5 % (ref 3.0–12.0)
Neutro Abs: 5.1 K/uL (ref 1.4–7.7)
Neutrophils Relative %: 50.4 % (ref 43.0–77.0)
Platelets: 292 K/uL (ref 150.0–400.0)
RBC: 5.5 Mil/uL — ABNORMAL HIGH (ref 3.87–5.11)
RDW: 14.6 % (ref 11.5–15.5)
WBC: 10.1 K/uL (ref 4.0–10.5)

## 2024-01-16 LAB — MICROALBUMIN / CREATININE URINE RATIO
Creatinine,U: 97.1 mg/dL
Microalb Creat Ratio: 126.3 mg/g — ABNORMAL HIGH (ref 0.0–30.0)
Microalb, Ur: 12.3 mg/dL — ABNORMAL HIGH (ref 0.0–1.9)

## 2024-01-16 LAB — VITAMIN D 25 HYDROXY (VIT D DEFICIENCY, FRACTURES): VITD: 40.07 ng/mL (ref 30.00–100.00)

## 2024-01-16 NOTE — Patient Instructions (Signed)
 Visit Information  Thank you for taking time to visit with me today. Please don't hesitate to contact me if I can be of assistance to you before our next scheduled telephone appointment.   Following is a copy of your care plan:   Goals Addressed             This Visit's Progress    COMPLETED: VBCI Transitions of Care (TOC) Care Plan       Problems: (reviewed 01/16/24) Recent Hospitalization for treatment of Post surgery from Cholecystectomy and Pain  Goal: (reviewed 01/16/24) Over the next 30 days, the patient will not experience hospital readmission (reviewed 01/16/24) Interventions:   Surgery (Cholecystectomy 12/03/23): Evaluation of current treatment plan related to Post Cholecystectomy with drain placement and Pain surgery reviewed post-operative instructions with patient/caregiver reviewed medications with patient and addressed questions reviewed scheduled provider appointments with patient7/3/25 with Jayson Endow confirmed availability of transportation to all appointments Patient's daughter will take her Reviewed pain management - stay on top of your pain. Medicate every 6 hours and take Tylenol  in between narcotic. - The patient has completed her narcotics (12/16/23) Adequate rest Empty drain when it reaches 30ml and record out put - removed 12/11/23 Change bandage to incision as directed by surgeon - covered. No dressing needed (12/30/23) Take Miralax  daily to avoid constipation - changed to PRN on 12/30/23 due to loose stool  Patient Self Care Activities:  (reviewed 01/16/24) Attend all scheduled provider appointments Call pharmacy for medication refills 3-7 days in advance of running out of medications Call provider office for new concerns or questions  Notify RN Care Manager of [facility] call rescheduling needs Participate in Transition of Care Program/Attend [facility] scheduled calls Perform all self care activities independently  Take medications as prescribed   12/30/23 - Medicaid is  pending due to patient is behind in getting her renewal paperwork to [facility]. She is unable to refill her medications until her Medicaid is renewed. A pharmacy referral was completed today for assistance 01/06/24 - The patient is unable to refill a prescription due to cost. The patient missed a phone call from CCM trying to coordinate services PCP appointment 01/15/24 - completed  Plan:  Telephone follow up appointment with care management team member scheduled for:  The patient has met there goals. D/C'd from [facility]        Patient verbalizes understanding of instructions and care plan provided today and agrees to view in MyChart. Active MyChart status and patient understanding of how to access instructions and care plan via MyChart confirmed with patient.     The patient has been provided with contact information for the care management team and has been advised to call with any health related questions or concerns.   Please call the care guide team at [phone] if you need to cancel or reschedule your appointment.   Please call the Suicide and Crisis Lifeline: 988 call the [place]  National Suicide Prevention Lifeline: 862-753-2567 or TTY: (218)614-4040 TTY 604-065-4837) to talk to a trained counselor if you are experiencing a Mental Health or Behavioral Health Crisis or need someone to talk to.  Medford Balboa, BSN, RN [place]  VBCI - [employer] [facility] RN Care Manager [phone]

## 2024-01-16 NOTE — Transitions of Care (Post Inpatient/ED Visit) (Signed)
 Transition of Care week 6  Visit Note  01/16/2024  Name: Kristen Wong MRN: [id number]          DOB: 1966/02/08  Situation: Patient enrolled in [facility] 30-day program. Visit completed with Grissel Hunsucker by telephone.   Background:   Past Medical History:  Diagnosis Date   Acute cholecystitis 12/03/2023   Arthritis of both knees    Atypical chest pain 07/28/2023   Bile duct leak 12/04/2023   Calculus of gallbladder without cholecystitis without obstruction 10/28/2023   COPD (chronic obstructive pulmonary disease) (HCC)    Depression    GERD (gastroesophageal reflux disease)    Heart murmur (asymptomatic)    Hepatic steatosis    Hypertension    Irritable bowel syndrome    Polycythemia    T2DM (type 2 diabetes mellitus) (HCC) 2018   Vitamin B 12 deficiency 07/13/2023    Assessment: Patient Reported Symptoms: Cognitive Cognitive Status: Alert and oriented to person, place, and time      Neurological Neurological Review of Symptoms: No symptoms reported    HEENT HEENT Symptoms Reported: No symptoms reported      Cardiovascular Cardiovascular Symptoms Reported: No symptoms reported    Respiratory Respiratory Symptoms Reported: Productive cough Additional Respiratory Details: The patient continues to smoke. The pharmacist did make contact with her and helped her with her medications. Respiratory Management Strategies: Adequate rest, Activity, Routine screening, Medication therapy  Endocrine Endocrine Symptoms Reported: No symptoms reported Is patient diabetic?: Yes Is patient checking blood sugars at home?: No    Gastrointestinal Gastrointestinal Symptoms Reported: No symptoms reported, Incontinence Additional Gastrointestinal Details: Rare incontinence but it can happen Gastrointestinal Management Strategies: Incontinence garment/pad    Genitourinary Genitourinary Symptoms Reported: No symptoms reported    Integumentary Integumentary Symptoms Reported: No symptoms  reported    Musculoskeletal Musculoskelatal Symptoms Reviewed: Joint pain Other Musculoskeletal Symptoms: Left shoulder pain Musculoskeletal Management Strategies: Medication therapy, Routine screening, Adequate rest      Psychosocial Psychosocial Symptoms Reported: No symptoms reported         There were no vitals filed for this visit.  Medications Reviewed Today     Reviewed by Moises Reusing, RN (Case Manager) on 01/16/24 at 1116  Med List Status: <None>   Medication Order Taking? Sig Documenting Provider Last Dose Status Informant  acetaminophen  (TYLENOL ) 500 MG tablet [id number]  Take 1,000 mg by mouth every 6 (six) hours as needed. [provider]  Active   albuterol  (VENTOLIN  HFA) 108 (90 Base) MCG/ACT inhaler [id number]  Inhale 1-2 puffs into the lungs every 6 (six) hours as needed. Hope Merle, MD  Active Self  aspirin EC 81 MG tablet [id number]  Take 81 mg by mouth daily. Swallow whole. [provider]  Active Self  atorvastatin  (LIPITOR) 20 MG tablet [id number]  Take 1 tablet (20 mg total) by mouth daily. Bair, Luke, MD  Active   busPIRone  (BUSPAR ) 7.5 MG tablet [id number]  Take 1 tablet (7.5 mg total) by mouth 2 (two) times daily. Bair, Luke, MD  Active   carvedilol  (COREG ) 3.125 MG tablet [id number]  Take 1 tablet (3.125 mg total) by mouth 2 (two) times daily with a meal. Bair, Kalpana, MD  Active   cetirizine (ZYRTEC) 10 MG tablet [id number]  Take 20 mg by mouth daily. [provider]  Active Self  dapagliflozin  propanediol (FARXIGA ) 10 MG TABS tablet [id number]  Take 1 tablet (10 mg total) by mouth daily before breakfast. Bair, Kalpana, MD  Active   escitalopram  (LEXAPRO )  10 MG tablet [id number]  Take 1 tablet (10 mg total) by mouth daily. Bair, Luke, MD  Active   fluticasone  (FLONASE ) 50 MCG/ACT nasal spray [id number]  Place 2 sprays into both nostrils daily. [provider]  Active Self  metFORMIN  (GLUCOPHAGE ) 500 MG tablet  [id number]  Take 2 tablets (1,000 mg total) by mouth 2 (two) times daily with a meal. Bair, Kalpana, MD  Active   naproxen (NAPROSYN) 500 MG tablet [id number]  Take 500 mg by mouth 2 (two) times daily. [provider]  Active Self  omeprazole  (PRILOSEC) 40 MG capsule [id number]  Take 1 capsule (40 mg total) by mouth daily. Bair, Kalpana, MD  Active   telmisartan  (MICARDIS ) 20 MG tablet [id number]  Take 1 tablet (20 mg total) by mouth daily. Bair, Kalpana, MD  Active   traZODone  (DESYREL ) 50 MG tablet [id number]  Take 1 tablet (50 mg total) by mouth at bedtime as needed for sleep. Bair, Kalpana, MD  Active   Vitamin D , Ergocalciferol , (DRISDOL ) 1.25 MG (50000 UNIT) CAPS capsule [id number]  Take 1 capsule (50,000 Units total) by mouth every 7 (seven) days. Abbey Luke, MD  Active             Follow Up Plan:   Closing From:  Transitions of Care Program  [facility], BSN, RN [place]  VBCI - [facility] RN Care Manager [phone]

## 2024-01-19 ENCOUNTER — Other Ambulatory Visit: Payer: Self-pay

## 2024-01-19 ENCOUNTER — Ambulatory Visit: Payer: Self-pay

## 2024-01-19 DIAGNOSIS — D582 Other hemoglobinopathies: Secondary | ICD-10-CM

## 2024-01-19 DIAGNOSIS — R109 Unspecified abdominal pain: Secondary | ICD-10-CM | POA: Insufficient documentation

## 2024-01-19 DIAGNOSIS — R7401 Elevation of levels of liver transaminase levels: Secondary | ICD-10-CM

## 2024-01-19 DIAGNOSIS — E118 Type 2 diabetes mellitus with unspecified complications: Secondary | ICD-10-CM

## 2024-01-19 MED ORDER — GLIPIZIDE 5 MG PO TABS
5.0000 mg | ORAL_TABLET | Freq: Every day | ORAL | 1 refills | Status: DC
  Filled 2024-01-19: qty 90, 90d supply, fill #0
  Filled 2024-03-24 – 2024-03-29 (×2): qty 90, 90d supply, fill #1

## 2024-01-20 ENCOUNTER — Ambulatory Visit: Payer: Self-pay

## 2024-01-20 ENCOUNTER — Ambulatory Visit
Admission: RE | Admit: 2024-01-20 | Discharge: 2024-01-20 | Disposition: A | Discharge status: Home / Self Care | Source: Ambulatory Visit

## 2024-01-20 DIAGNOSIS — R7401 Elevation of levels of liver transaminase levels: Secondary | ICD-10-CM | POA: Insufficient documentation

## 2024-01-20 DIAGNOSIS — R109 Unspecified abdominal pain: Secondary | ICD-10-CM | POA: Diagnosis present

## 2024-01-20 MED ORDER — IOHEXOL 300 MG/ML  SOLN
100.0000 mL | Freq: Once | INTRAMUSCULAR | Status: AC | PRN
  Administered 2024-01-20 (×2): 100 mL via INTRAVENOUS

## 2024-01-20 NOTE — Telephone Encounter (Signed)
 Pt has procedure clearance 8/25 with Pulm

## 2024-01-20 NOTE — Progress Notes (Signed)
 CT abdomen-pelvis result discussed with the patient. Counseled her to seek immediate medical care if worsening abdominal pain, fever, chills or other signs of infection.   Discussed incidental finding: b/l non obstructing intrarenal stones.   Luke Shade, MD

## 2024-01-21 ENCOUNTER — Other Ambulatory Visit: Payer: Self-pay

## 2024-01-24 NOTE — Progress Notes (Unsigned)
 Cardiology Office Note  Date:  01/26/2024   ID:  Kristen Wong, DOB [birth year], MRN [id number]  PCP:  Abbey Bruckner, MD   Chief Complaint  Patient presents with   New Patient (Initial Visit)    Ref by Dr. Isaiah for shortness of breath for evaluation of risk factors.     HPI:  Kristen Wong is a 58 y.o. female with past medical history of: COPD, long smoking history, since teenager 2 ppd Diabetes type 2 Mild Aortic atherosclerosis Hypertension Who presents by referral from Dr. Isaiah for Consultation of her shortness of breath, concern for cardiac risk factors  On discussion today she presents with her daughter She reports having chronic SOB Long history of smoking 2 packs/day since she was a teenager Activity limited by arthritis in her knees, chronic back pain  Denies chest pain concerning for angina  S/p cholecystectomy with complications June 25th 2025 Biliary stent placed, scheduled to have this removed next month  Prior CT chest June 2023 Mild Aortic atherosclerosis  Labs reviewed A1c 8.3 Total cholesterol 128 LDL 58  EKG personally reviewed by myself on todays visit EKG Interpretation Date/Time:  Monday January 26 2024 11:42:51 EDT Ventricular Rate:  84 PR Interval:  146 QRS Duration:  98 QT Interval:  416 QTC Calculation: 491 R Axis:   35  Text Interpretation: Normal sinus rhythm consider Septal infarct (cited on or before 06-Apr-2018) When compared with ECG of 06-Apr-2018 14:07, No significant change was found Confirmed by Perla Lye [id number]) on 01/26/2024 12:06:08 PM    PMH:   has a past medical history of Acute cholecystitis (12/03/2023), Arthritis of both knees, Atypical chest pain (07/28/2023), Bile duct leak (12/04/2023), Calculus of gallbladder without cholecystitis without obstruction (10/28/2023), COPD (chronic obstructive pulmonary disease) (HCC), Depression, GERD (gastroesophageal reflux disease), Heart murmur (asymptomatic), Hepatic steatosis,  Hypertension, Irritable bowel syndrome, Polycythemia, T2DM (type 2 diabetes mellitus) (HCC) (2018), and Vitamin B 12 deficiency (07/13/2023).  PSH:    Past Surgical History:  Procedure Laterality Date   ABDOMINAL HYSTERECTOMY     BILIARY STENT PLACEMENT  12/04/2023   Procedure: INSERTION, STENT, BILE DUCT;  Surgeon: Jinny Carmine, MD;  Location: [facility] ENDOSCOPY;  Service: Endoscopy;;   CESAREAN SECTION     CHOLECYSTECTOMY  12/03/2023   COLONOSCOPY WITH PROPOFOL  N/A 08/08/2017   Procedure: COLONOSCOPY WITH PROPOFOL ;  Surgeon: Viktoria Lamar DASEN, MD;  Location: [facility] ENDOSCOPY;  Service: Endoscopy;  Laterality: N/A;   COLONOSCOPY WITH PROPOFOL  N/A 01/12/2021   Procedure: COLONOSCOPY WITH PROPOFOL ;  Surgeon: Maryruth Ole DASEN, MD;  Location: [facility] ENDOSCOPY;  Service: Endoscopy;  Laterality: N/A;  DM   COLONOSCOPY WITH PROPOFOL  N/A 05/11/2021   Procedure: COLONOSCOPY WITH PROPOFOL ;  Surgeon: Maryruth Ole DASEN, MD;  Location: [facility] ENDOSCOPY;  Service: Endoscopy;  Laterality: N/A;  DM   COLONOSCOPY WITH PROPOFOL  N/A 02/08/2022   Procedure: COLONOSCOPY WITH PROPOFOL ;  Surgeon: Maryruth Ole DASEN, MD;  Location: [facility] ENDOSCOPY;  Service: Endoscopy;  Laterality: N/A;   DIAGNOSTIC LAPAROSCOPY     ERCP N/A 12/04/2023   Procedure: ERCP, WITH INTERVENTION IF INDICATED;  Surgeon: Jinny Carmine, MD;  Location: [facility] ENDOSCOPY;  Service: Endoscopy;  Laterality: N/A;  To check with anesthesia 6/26 to confirm time this case can go with Dr Jinny   ESOPHAGOGASTRODUODENOSCOPY (EGD) WITH PROPOFOL  N/A 08/08/2017   Procedure: ESOPHAGOGASTRODUODENOSCOPY (EGD) WITH PROPOFOL ;  Surgeon: Viktoria Lamar DASEN, MD;  Location: [facility] ENDOSCOPY;  Service: Endoscopy;  Laterality: N/A;   KNEE SURGERY     [facility]  LAPAROSCOPIC HYSTERECTOMY Bilateral 01/22/2017   Procedure: HYSTERECTOMY TOTAL LAPAROSCOPIC BSO;  Surgeon: Lake Read, MD;  Location: [facility] ORS;  Service: Gynecology;  Laterality: Bilateral;    Current Outpatient  Medications  Medication Sig Dispense Refill   acetaminophen  (TYLENOL ) 500 MG tablet Take 1,000 mg by mouth every 6 (six) hours as needed.     albuterol  (VENTOLIN  HFA) 108 (90 Base) MCG/ACT inhaler Inhale 1-2 puffs into the lungs every 6 (six) hours as needed. 8.7 g 3   aspirin EC 81 MG tablet Take 81 mg by mouth daily. Swallow whole.     atorvastatin  (LIPITOR) 20 MG tablet Take 1 tablet (20 mg total) by mouth daily. 90 tablet 3   busPIRone  (BUSPAR ) 7.5 MG tablet Take 1 tablet (7.5 mg total) by mouth 2 (two) times daily. 180 tablet 3   carvedilol  (COREG ) 3.125 MG tablet Take 1 tablet (3.125 mg total) by mouth 2 (two) times daily with a meal. 180 tablet 3   cetirizine (ZYRTEC) 10 MG tablet Take 20 mg by mouth daily.     dapagliflozin  propanediol (FARXIGA ) 10 MG TABS tablet Take 1 tablet (10 mg total) by mouth daily before breakfast. 90 tablet 3   escitalopram  (LEXAPRO ) 10 MG tablet Take 1 tablet (10 mg total) by mouth daily. 90 tablet 3   fluticasone  (FLONASE ) 50 MCG/ACT nasal spray Place 2 sprays into both nostrils daily.     glipiZIDE  (GLUCOTROL ) 5 MG tablet Take 1 tablet (5 mg total) by mouth daily before breakfast. 90 tablet 1   metFORMIN  (GLUCOPHAGE ) 500 MG tablet Take 2 tablets (1,000 mg total) by mouth 2 (two) times daily with a meal. 60 tablet 1   naproxen (NAPROSYN) 500 MG tablet Take 500 mg by mouth 2 (two) times daily.     omeprazole  (PRILOSEC) 40 MG capsule Take 1 capsule (40 mg total) by mouth daily. 90 capsule 3   telmisartan  (MICARDIS ) 20 MG tablet Take 1 tablet (20 mg total) by mouth daily. 90 tablet 3   traZODone  (DESYREL ) 50 MG tablet Take 1 tablet (50 mg total) by mouth at bedtime as needed for sleep. 90 tablet 3   Vitamin D , Ergocalciferol , (DRISDOL ) 1.25 MG (50000 UNIT) CAPS capsule Take 1 capsule (50,000 Units total) by mouth every 7 (seven) days. 12 capsule 0   No current facility-administered medications for this visit.     Allergies:   Penicillin g   Social History:   The patient  reports that she has been smoking cigarettes. She has a 60 pack-year smoking history. She has been exposed to tobacco smoke. She has never used smokeless tobacco. She reports that she does not drink alcohol and does not use drugs.   Family History:   family history includes Diabetes in her father, mother, and sister; Emphysema in her mother; Hypertension in her father.    Review of Systems: Review of Systems  Constitutional: Negative.   HENT: Negative.    Respiratory:  Positive for shortness of breath.   Cardiovascular: Negative.   Gastrointestinal: Negative.   Musculoskeletal: Negative.   Neurological: Negative.   Psychiatric/Behavioral: Negative.    All other systems reviewed and are negative.   PHYSICAL EXAM: VS:  BP 138/80 (BP Location: Right Arm, Patient Position: Sitting, Cuff Size: Normal)   Pulse 84   Ht 5' 4 (1.626 m)   Wt 196 lb 4 oz (89 kg)   SpO2 93%   BMI 33.69 kg/m  , BMI Body mass index is 33.69 kg/m. GEN: Well nourished, well  developed, in no acute distress HEENT: normal Neck: no JVD, carotid bruits, or masses Cardiac: RRR; no murmurs, rubs, or gallops,no edema  Respiratory:  clear to auscultation bilaterally, normal work of breathing GI: soft, nontender, nondistended, + BS MS: no deformity or atrophy Skin: warm and dry, no rash Neuro:  Strength and sensation are intact Psych: euthymic mood, full affect  Recent Labs: 07/01/2023: TSH 2.43 01/15/2024: ALT 61; BUN 8; Creatinine, Ser 0.50; Hemoglobin 16.1; Platelets 292.0; Potassium 3.6; Sodium 140    Lipid Panel No results found for: CHOL, HDL, LDLCALC, TRIG    Wt Readings from Last 3 Encounters:  01/26/24 196 lb 4 oz (89 kg)  01/15/24 196 lb 12.8 oz (89.3 kg)  12/23/23 199 lb (90.3 kg)     ASSESSMENT AND PLAN:  Problem List Items Addressed This Visit       Cardiology Problems   Hypertension associated with diabetes (HCC)   Relevant Orders   EKG 12-Lead (Completed)   Aortic  atherosclerosis (HCC)   Relevant Orders   EKG 12-Lead (Completed)     Other   Type 2 diabetes mellitus with complications (HCC)   Centrilobular emphysema (HCC)    Shortness of breath/COPD Likely multifactorial including COPD, long history of smoking, obesity, deconditioning CT scan chest reviewed with no appreciable coronary calcium .  Recommended echocardiogram to rule out structural heart disease, evaluate heart valves and for pulmonary hypertension  Aortic atherosclerosis Smoking cessation recommended Continue Lipitor 20 daily (though she reports not taking this recently for unclear reasons), numbers at goal on the medication  obesity We have encouraged continued exercise, careful diet management in an effort to lose weight.  Smoker We have encouraged her to continue to work on weaning her cigarettes and smoking cessation. She will continue to work on this and does not want any assistance with chantix.    Diabetes type 2 with complications Stressed the importance of calorie restriction, low carbohydrate diet A1c running 8.3   Signed, Velinda Lunger, M.D., Ph.D. [facility] [place], [place] [phone]

## 2024-01-26 ENCOUNTER — Telehealth: Payer: Self-pay

## 2024-01-26 ENCOUNTER — Ambulatory Visit: Attending: Cardiovascular Disease | Admitting: Cardiovascular Disease

## 2024-01-26 ENCOUNTER — Encounter: Payer: Self-pay | Admitting: Cardiovascular Disease

## 2024-01-26 VITALS — BP 138/80 | HR 84 | Ht 64.0 in | Wt 196.2 lb

## 2024-01-26 DIAGNOSIS — I152 Hypertension secondary to endocrine disorders: Secondary | ICD-10-CM | POA: Insufficient documentation

## 2024-01-26 DIAGNOSIS — I700 Atherosclerosis of aorta: Secondary | ICD-10-CM | POA: Diagnosis not present

## 2024-01-26 DIAGNOSIS — R0602 Shortness of breath: Secondary | ICD-10-CM | POA: Diagnosis not present

## 2024-01-26 DIAGNOSIS — E1159 Type 2 diabetes mellitus with other circulatory complications: Secondary | ICD-10-CM | POA: Diagnosis not present

## 2024-01-26 DIAGNOSIS — E118 Type 2 diabetes mellitus with unspecified complications: Secondary | ICD-10-CM | POA: Insufficient documentation

## 2024-01-26 DIAGNOSIS — J432 Centrilobular emphysema: Secondary | ICD-10-CM | POA: Diagnosis not present

## 2024-01-26 NOTE — Patient Instructions (Addendum)

## 2024-01-26 NOTE — Progress Notes (Signed)
 Complex Care Management Note Care Guide Note  01/26/2024 Name: Kristen Wong MRN: [id number] DOB: March 11, 1966   Complex Care Management Outreach Attempts: An unsuccessful telephone outreach was attempted today to offer the patient information about available complex care management services.  Follow Up Plan:  Additional outreach attempts will be made to offer the patient complex care management information and services.   Encounter Outcome:  No Answer  [name]  [facility], [facility] VBCI Assistant Direct Dial: [phone]  Fax: [phone]

## 2024-01-27 ENCOUNTER — Telehealth: Payer: Self-pay

## 2024-01-27 ENCOUNTER — Other Ambulatory Visit (HOSPITAL_COMMUNITY): Payer: Self-pay

## 2024-01-27 NOTE — Telephone Encounter (Signed)
 Pharmacy Patient Advocate Encounter   Received notification from CoverMyMeds that prior authorization for Farxiga  10MG  tablets  is required/requested.   Insurance verification completed.   The patient is insured through [facility] .   Per test claim: PA required and submitted KEY/EOC/Request #: [id number] APPROVED from 01/27/24 to 01/26/25. Ran test claim, Copay is $4. This test claim was processed through [facility]- copay amounts may vary at other pharmacies due to pharmacy/plan contracts, or as the patient moves through the different stages of their insurance plan.

## 2024-01-28 ENCOUNTER — Other Ambulatory Visit: Payer: Self-pay

## 2024-01-28 NOTE — Telephone Encounter (Signed)
 Last read by Deijah M Lorusso at 9:37AM on 01/28/2024.

## 2024-01-29 ENCOUNTER — Other Ambulatory Visit (INDEPENDENT_AMBULATORY_CARE_PROVIDER_SITE_OTHER): Payer: Self-pay | Admitting: Pharmacist

## 2024-01-29 DIAGNOSIS — E118 Type 2 diabetes mellitus with unspecified complications: Secondary | ICD-10-CM

## 2024-01-29 NOTE — Progress Notes (Signed)
 Documentation Reason: Medication Access  Summary of Call: New medication prescribed by PCP: Farxiga  Prior auth required  Prior auth has been approved   [facility] test claim = $4. Checked with [facility], confirmed medication is ready for pickup.   Discussed new prescription with patient. Reports $4 is affordable. Has no further questions today.   Routed to PCP.  Repeat BMP due ~4 weeks after SGLT2i start.   Follow Up: Patient given direct line for further questions/concerns.  Manuelita FABIENE Kobs, PharmD Clinical Pharmacist [facility] [phone]

## 2024-01-30 NOTE — Progress Notes (Signed)
 1. Type 2 diabetes mellitus with complications (HCC) (Primary) - Basic Metabolic Panel (BMET); Future - Appreciate pharmacist collaboration.   Luke Shade, MD

## 2024-01-30 NOTE — Addendum Note (Signed)
 Addended by: Joanmarie Tsang on: 01/30/2024 08:03 AM   Modules accepted: Orders

## 2024-02-02 ENCOUNTER — Ambulatory Visit: Payer: Self-pay | Admitting: Nurse Practitioner

## 2024-02-02 ENCOUNTER — Encounter: Payer: Self-pay | Admitting: Nurse Practitioner

## 2024-02-02 ENCOUNTER — Other Ambulatory Visit: Payer: Self-pay

## 2024-02-02 ENCOUNTER — Ambulatory Visit: Payer: Self-pay | Admitting: Internal Medicine

## 2024-02-02 VITALS — BP 138/80 | HR 104 | Temp 98.4°F | Ht 64.0 in | Wt 196.0 lb

## 2024-02-02 DIAGNOSIS — J449 Chronic obstructive pulmonary disease, unspecified: Secondary | ICD-10-CM

## 2024-02-02 DIAGNOSIS — Z01811 Encounter for preprocedural respiratory examination: Secondary | ICD-10-CM | POA: Diagnosis not present

## 2024-02-02 DIAGNOSIS — F17210 Nicotine dependence, cigarettes, uncomplicated: Secondary | ICD-10-CM | POA: Diagnosis not present

## 2024-02-02 DIAGNOSIS — F17200 Nicotine dependence, unspecified, uncomplicated: Secondary | ICD-10-CM

## 2024-02-02 DIAGNOSIS — J432 Centrilobular emphysema: Secondary | ICD-10-CM | POA: Diagnosis not present

## 2024-02-02 LAB — PULMONARY FUNCTION TEST
DL/VA % pred: 118 %
DL/VA: 4.99 ml/min/mmHg/L
DLCO unc % pred: 102 %
DLCO unc: 20.57 ml/min/mmHg
FEF 25-75 Post: 2.71 L/s
FEF 25-75 Pre: 2.26 L/s
FEF2575-%Change-Post: 19 %
FEF2575-%Pred-Post: 113 %
FEF2575-%Pred-Pre: 94 %
FEV1-%Change-Post: 9 %
FEV1-%Pred-Post: 78 %
FEV1-%Pred-Pre: 71 %
FEV1-Post: 2.03 L
FEV1-Pre: 1.85 L
FEV1FVC-%Change-Post: 0 %
FEV1FVC-%Pred-Pre: 108 %
FEV6-%Change-Post: 9 %
FEV6-%Pred-Post: 74 %
FEV6-%Pred-Pre: 67 %
FEV6-Post: 2.4 L
FEV6-Pre: 2.18 L
FEV6FVC-%Pred-Post: 103 %
FEV6FVC-%Pred-Pre: 103 %
FVC-%Change-Post: 9 %
FVC-%Pred-Post: 71 %
FVC-%Pred-Pre: 65 %
FVC-Post: 2.4 L
FVC-Pre: 2.18 L
Post FEV1/FVC ratio: 85 %
Post FEV6/FVC ratio: 100 %
Pre FEV1/FVC ratio: 85 %
Pre FEV6/FVC Ratio: 100 %
RV % pred: 44 %
RV: 0.87 L
TLC % pred: 102 %
TLC: 5.19 L

## 2024-02-02 MED ORDER — SPACER/AERO-HOLDING CHAMBERS DEVI
1 refills | Status: AC
  Filled 2024-02-02 – 2024-03-29 (×2): qty 1, 30d supply, fill #0
  Filled 2024-04-26: qty 1, 30d supply, fill #1

## 2024-02-02 NOTE — Assessment & Plan Note (Signed)
 Moderate risk. Factors that increase the risk for postoperative pulmonary complications are emphysema, body habitus, smoker   Respiratory complications generally occur in 1% of ASA Class I patients, 5% of ASA Class II and 10% of ASA Class III-IV patients These complications rarely result in mortality and include postoperative pneumonia, atelectasis, pulmonary embolism, ARDS and increased time requiring postoperative mechanical ventilation.   Overall, I recommend proceeding with the surgery if the risk for respiratory complications are outweighed by the potential benefits. This will need to be discussed between the patient and surgeon.   To reduce risks of respiratory complications, I recommend: --Pre- and post-operative incentive spirometry performed frequently while awake --Inpatient use of currently prescribed bronchodilators   --Short duration of surgery as much as possible and avoid paralytic if possible --OOB, encourage mobility post-op

## 2024-02-02 NOTE — Assessment & Plan Note (Addendum)
 Mild emphysema without formal obstruction on PFT. Not on maintenance therapy. Utilizing SABA 4-5 times a week. Reviewed role of maintenance vs rescue therapy. Advised that if she has poor controlled or an exacerbation, we would need to start her on a daily maintenance inhaler. In interim, encouraged to work on graded exercises and smoking cessation. Action plan in place. Trigger prevention reviewed. Requested spacer for SABA use.   Patient Instructions  Continue Albuterol  inhaler 2 puffs every 6 hours as needed for shortness of breath or wheezing. Notify if symptoms persist despite rescue inhaler/neb use.   Good luck with your surgery  Use incentive spirometer ten times an hour while awake during the recovery period. Take your rescue inhaler with you to surgery. Up and moving as soon as possible after surgery   Please call [phone] and schedule your annual Lung CT. You are 2 years overdue so need to get this done soon  Follow up in 6 months with Dr. Isaiah. If symptoms do not improve or worsen, please contact office for sooner follow up or seek emergency care.

## 2024-02-02 NOTE — Progress Notes (Signed)
 Full PFT completed today ? ?

## 2024-02-02 NOTE — Patient Instructions (Addendum)
 Continue Albuterol  inhaler 2 puffs every 6 hours as needed for shortness of breath or wheezing. Notify if symptoms persist despite rescue inhaler/neb use.   Good luck with your surgery  Use incentive spirometer ten times an hour while awake during the recovery period. Take your rescue inhaler with you to surgery. Up and moving as soon as possible after surgery   Please call [phone] and schedule your annual Lung CT. You are 2 years overdue so need to get this done soon  Follow up in 6 months with Dr. Isaiah. If symptoms do not improve or worsen, please contact office for sooner follow up or seek emergency care.

## 2024-02-02 NOTE — Patient Instructions (Signed)
 Full PFT completed today ? ?

## 2024-02-02 NOTE — Progress Notes (Signed)
 @Patient  ID: Kristen Wong, female    DOB: 1966-04-01, 58 y.o.   MRN: [id number]  Chief Complaint  Patient presents with   COPD    Occasional cough and wheezing.     Referring provider: Kasa, Kurian, MD  HPI: 58 year old female, active smoker followed for COPD.  She is patient Dr. Jacqulyn and last seen in office 11/25/2023.Past medical history significant for hypertension, DM, atherosclerosis, GERD, HLD.  TEST/EVENTS:  11/23/2021 LDCT chest lung cancer screen: Emphysema, chronic COPD.  Numerous scattered solid bilateral pulmonary nodules, largest 5 mm.  Lung RADS 2.  Stable goiter.  Atherosclerosis. 02/02/2024 PFT: FVC 65, FEV1 71, ratio 85, TLC 102, DLCO 102.  No BD  11/25/2023: OV with Dr. Isaiah.  Seen for assessment of COPD.  Diagnosed many years ago.  Only uses Ventolin  as needed 3-5 times per week.  Has ongoing symptoms of shortness of breath, intermittent wheeze and nonproductive cough.  Extensive smoking history.  Smoking cessation advised.  No exertional hypoxia.  ONO ordered.  Breztri  was not affordable.  Plan to start Advair HFA.  Needs to reestablish in lung cancer screening program.  Recommended cardiology referral.  Has gallbladder surgery pending.  Moderate risk.  02/02/2024: Today - follow up Discussed the use of AI scribe software for clinical note transcription with the patient, who gave verbal consent to proceed.  History of Present Illness Kristen Wong is a 58 year old female who presents for follow up. Her daughter is with her. She had PFTs today with decreased lung function but without formal obstruction.   She uses her rescue inhaler, albuterol , approximately four to five times a week, but not daily. She never started Advair. She feels she is managing well with her current regimen. She gets short winded with distance walking or uphill climbing. No daily use of the rescue inhaler, no wheezing, and no hemoptysis.No fevers, chills, chest tightness. She experiences occasional  coughing, which she attributes to allergies and smoking.  She reports some weight loss and a decreased appetite related to gallbladder issues.   She is an active smoker. She is overdue for a lung cancer screening CT scan by two years.  She is supposed to have her gallbladder stent removed.     Allergies  Allergen Reactions   Penicillin G Nausea Only    Has patient had a PCN reaction causing immediate rash, facial/tongue/throat swelling, SOB or lightheadedness with hypotension: No Has patient had a PCN reaction causing severe rash involving mucus membranes or skin necrosis: No Has patient had a PCN reaction that required hospitalization: No Has patient had a PCN reaction occurring within the last 10 years: No If all of the above answers are NO, then may proceed with Cephalosporin use.     Immunization History  Administered Date(s) Administered   Hep A / Hep B 03/25/2018   Influenza Inj Mdck Quad Pf 03/05/2022   Influenza Inj Mdck Quad With Preservative 04/18/2019   Influenza, Mdck, Trivalent,PF 6+ MOS(egg free) 04/16/2023   Influenza,inj,Quad PF,6+ Mos 03/25/2018, 03/20/2020   PFIZER Comirnaty(Gray Top)Covid-19 Tri-Sucrose Vaccine 10/09/2019, 10/30/2019   PNEUMOCOCCAL CONJUGATE-20 08/29/2022   Pneumococcal Polysaccharide-23 04/28/2018   Tdap 06/21/2016    Past Medical History:  Diagnosis Date   Acute cholecystitis 12/03/2023   Arthritis of both knees    Atypical chest pain 07/28/2023   Bile duct leak 12/04/2023   Calculus of gallbladder without cholecystitis without obstruction 10/28/2023   COPD (chronic obstructive pulmonary disease) (HCC)    Depression  GERD (gastroesophageal reflux disease)    Heart murmur (asymptomatic)    Hepatic steatosis    Hypertension    Irritable bowel syndrome    Polycythemia    T2DM (type 2 diabetes mellitus) (HCC) 2018   Vitamin B 12 deficiency 07/13/2023    Tobacco History: Social History   Tobacco Use  Smoking Status Every  Day   Current packs/day: 2.00   Average packs/day: 2.0 packs/day for 44.6 years (89.3 ttl pk-yrs)   Types: Cigarettes   Start date: 64   Passive exposure: Past  Smokeless Tobacco Never   Ready to quit: Not Answered Counseling given: Not Answered   Outpatient Medications Prior to Visit  Medication Sig Dispense Refill   acetaminophen  (TYLENOL ) 500 MG tablet Take 1,000 mg by mouth every 6 (six) hours as needed.     albuterol  (VENTOLIN  HFA) 108 (90 Base) MCG/ACT inhaler Inhale 1-2 puffs into the lungs every 6 (six) hours as needed. 8.7 g 3   aspirin EC 81 MG tablet Take 81 mg by mouth daily. Swallow whole.     atorvastatin  (LIPITOR) 20 MG tablet Take 1 tablet (20 mg total) by mouth daily. 90 tablet 3   busPIRone  (BUSPAR ) 7.5 MG tablet Take 1 tablet (7.5 mg total) by mouth 2 (two) times daily. 180 tablet 3   carvedilol  (COREG ) 3.125 MG tablet Take 1 tablet (3.125 mg total) by mouth 2 (two) times daily with a meal. 180 tablet 3   cetirizine (ZYRTEC) 10 MG tablet Take 20 mg by mouth daily.     dapagliflozin  propanediol (FARXIGA ) 10 MG TABS tablet Take 1 tablet (10 mg total) by mouth daily before breakfast. 90 tablet 3   escitalopram  (LEXAPRO ) 10 MG tablet Take 1 tablet (10 mg total) by mouth daily. 90 tablet 3   fluticasone  (FLONASE ) 50 MCG/ACT nasal spray Place 2 sprays into both nostrils daily.     glipiZIDE  (GLUCOTROL ) 5 MG tablet Take 1 tablet (5 mg total) by mouth daily before breakfast. 90 tablet 1   metFORMIN  (GLUCOPHAGE ) 500 MG tablet Take 2 tablets (1,000 mg total) by mouth 2 (two) times daily with a meal. 60 tablet 1   naproxen (NAPROSYN) 500 MG tablet Take 500 mg by mouth 2 (two) times daily.     omeprazole  (PRILOSEC) 40 MG capsule Take 1 capsule (40 mg total) by mouth daily. 90 capsule 3   telmisartan  (MICARDIS ) 20 MG tablet Take 1 tablet (20 mg total) by mouth daily. 90 tablet 3   traZODone  (DESYREL ) 50 MG tablet Take 1 tablet (50 mg total) by mouth at bedtime as needed for  sleep. 90 tablet 3   Vitamin D , Ergocalciferol , (DRISDOL ) 1.25 MG (50000 UNIT) CAPS capsule Take 1 capsule (50,000 Units total) by mouth every 7 (seven) days. 12 capsule 0   No facility-administered medications prior to visit.     Review of Systems: As above; otherwise, negative.    Physical Exam:  BP 138/80   Pulse (!) 104   Temp 98.4 F (36.9 C) (Oral)   Ht 5' 4 (1.626 m)   Wt 196 lb (88.9 kg)   SpO2 93%   BMI 33.64 kg/m   GEN: Pleasant, interactive, well-kempt; obese; in no acute distress HEENT:  Normocephalic and atraumatic. PERRLA. Sclera white. Nasal turbinates pink, moist and patent bilaterally. No rhinorrhea present. Oropharynx pink and moist, without exudate or edema. No lesions, ulcerations, or postnasal drip.  NECK:  Supple w/ fair ROM. No lymphadenopathy.   CV: RRR, no m/r/g, no peripheral  edema. Pulses intact, +2 bilaterally. No cyanosis, pallor or clubbing. PULMONARY:  Unlabored, regular breathing. Clear bilaterally A&P w/o wheezes/rales/rhonchi.  GI: BS present and normoactive. Soft, non-tender to palpation.  MSK: No erythema, warmth or tenderness. Cap refil <2 sec all extrem.  Neuro: A/Ox3. No focal deficits noted.   Skin: Warm, no lesions or rashe Psych: Normal affect and behavior. Judgement and thought content appropriate.     Lab Results:  CBC    Component Value Date/Time   WBC 10.1 01/15/2024 1503   RBC 5.50 (H) 01/15/2024 1503   HGB 16.1 (H) 01/15/2024 1503   HCT 48.1 (H) 01/15/2024 1503   PLT 292.0 01/15/2024 1503   MCV 87.5 01/15/2024 1503   MCH 29.6 12/04/2023 0427   MCHC 33.6 01/15/2024 1503   RDW 14.6 01/15/2024 1503   LYMPHSABS 4.0 01/15/2024 1503   MONOABS 0.7 01/15/2024 1503   EOSABS 0.2 01/15/2024 1503   BASOSABS 0.1 01/15/2024 1503    BMET    Component Value Date/Time   NA 140 01/15/2024 1503   K 3.6 01/15/2024 1503   CL 100 01/15/2024 1503   CO2 25 01/15/2024 1503   GLUCOSE 153 (H) 01/15/2024 1503   BUN 8 01/15/2024 1503    CREATININE 0.50 01/15/2024 1503   CALCIUM  9.3 01/15/2024 1503   GFRNONAA >60 12/04/2023 0427   GFRAA >60 07/19/2019 1258    BNP No results found for: BNP   Imaging:  CT ABDOMEN PELVIS W CONTRAST Addendum Date: 01/20/2024 ADDENDUM REPORT: 01/20/2024 13:51 ADDENDUM: Additional history has been obtained from the patient's clinician. The patient underwent a subtotal cholecystectomy. The finding in the gallbladder fossa is consistent with the residual gallbladder. In light of this history, there are no findings to suggest a complication of the recent subtotal cholecystectomy, specifically, no evidence an abscess or bile leak. Electronically Signed   By: Alm Parkins M.D.   On: 01/20/2024 13:51   Result Date: 01/20/2024 CLINICAL DATA:  Postop abdominal pain. Recent cholecystectomy with complications. EXAM: CT ABDOMEN AND PELVIS WITH CONTRAST TECHNIQUE: Multidetector CT imaging of the abdomen and pelvis was performed using the standard protocol following bolus administration of intravenous contrast. RADIATION DOSE REDUCTION: This exam was performed according to the departmental dose-optimization program which includes automated exposure control, adjustment of the mA and/or kV according to patient size and/or use of iterative reconstruction technique. CONTRAST:  OMNIPAQUE  IOHEXOL  300 MG/ML  SOLN COMPARISON:  Right upper quadrant ultrasound, 10/24/2023. CT, 06/04/2022. FINDINGS: Lower chest: No acute findings. Two stable 3 mm nodules a left lower lobe, benign. No follow-up indicated. Hepatobiliary: Liver top normal in size. Relative enlargement of the lateral segment of the left lobe. Subtle surface nodularity. No liver mass. Small focus of hypoattenuation adjacent to the gallbladder fossa measuring 1.5 cm, suspected to be focal fat. There is a dilated tubular type fluid containing structure that lies in the gallbladder fossa measuring approximately 3.5 by 2.7 x 1.6 cm. This has the appearance of a  mostly decompressed gallbladder, but the provided history says cholecystectomy. It could reflect dilated cystic duct remnant. A small postoperative collection is also in the differential diagnosis. A stent extends from the common hepatic duct into the third portion of the duodenum. No intra or extrahepatic bile duct dilation. Pancreas: Unremarkable. No pancreatic ductal dilatation or surrounding inflammatory changes. Spleen: Normal in size without focal abnormality. Adrenals/Urinary Tract: Normal adrenal glands. Kidneys normal in size, orientation and position with symmetric enhancement and excretion. Small bilateral nonobstructing intrarenal stones similar to  the prior CT. No mass. No hydronephrosis. Normal ureters. Bladder minimally distended, otherwise unremarkable. Stomach/Bowel: Normal stomach. Small bowel and colon are normal in caliber. No wall thickening. No inflammation. Scattered colonic diverticula. Normal appendix visualized. Vascular/Lymphatic: Aortic atherosclerosis. No aneurysm. There are prominent mildly enlarged gastrohepatic ligament lymph nodes measuring up to 1.1 cm short axis. No other prominent or enlarged lymph nodes. Reproductive: Status post hysterectomy. No adnexal masses. Other: Trace amount of ascites noted adjacent to the liver and in the posterior pelvic recess. Musculoskeletal: No fracture or acute finding.  No bone lesion. IMPRESSION: 1. Small well-defined collection in the gallbladder fossa detailed above. This could reflect a dilated cystic duct remnant. It has the appearance of a mostly decompressed gallbladder, but reportedly the gallbladder is surgically absent. There are no post surgical clips along the porta hepatis, however. If the gallbladder was removed, a small postoperative collection would be in the differential diagnosis. Correlation with the patient's surgical history is indicated. 2. Well-positioned biliary stent. No intra or extrahepatic bile duct dilation. 3. Trace  amount of ascites. 4. Liver morphologic changes suggesting cirrhosis. Mild porta hepatis/gastrohepatic ligament lymphadenopathy similar to the prior CT. No liver mass. 5. Multiple nonobstructing intrarenal stones. 6. Aortic atherosclerosis. Electronically Signed: By: Alm Parkins M.D. On: 01/20/2024 09:17    Administration History     None          Latest Ref Rng & Units 02/02/2024    9:46 AM  PFT Results  FVC-Pre L 2.18  P  FVC-Predicted Pre % 65  P  FVC-Post L 2.40  P  FVC-Predicted Post % 71  P  Pre FEV1/FVC % % 85  P  Post FEV1/FCV % % 85  P  FEV1-Pre L 1.85  P  FEV1-Predicted Pre % 71  P  FEV1-Post L 2.03  P  DLCO uncorrected ml/min/mmHg 20.57  P  DLCO [facility]% % 102  P  DLVA Predicted % 118  P  TLC L 5.19  P  TLC % Predicted % 102  P  RV % Predicted % 44  P    P Preliminary result    No results found for: NITRICOXIDE      Assessment & Plan:   Centrilobular emphysema (HCC) Mild emphysema without formal obstruction on PFT. Not on maintenance therapy. Utilizing SABA 4-5 times a week. Reviewed role of maintenance vs rescue therapy. Advised that if she has poor controlled or an exacerbation, we would need to start her on a daily maintenance inhaler. In interim, encouraged to work on graded exercises and smoking cessation. Action plan in place. Trigger prevention reviewed. Requested spacer for SABA use.   Patient Instructions  Continue Albuterol  inhaler 2 puffs every 6 hours as needed for shortness of breath or wheezing. Notify if symptoms persist despite rescue inhaler/neb use.   Good luck with your surgery  Use incentive spirometer ten times an hour while awake during the recovery period. Take your rescue inhaler with you to surgery. Up and moving as soon as possible after surgery   Please call [phone] and schedule your annual Lung CT. You are 2 years overdue so need to get this done soon  Follow up in 6 months with Dr. Isaiah. If symptoms do not improve or  worsen, please contact office for sooner follow up or seek emergency care.    Smoker Active smoker with significant hx. Lung RADS 2 11/2021. Overdue for follow up. Referral placed in June 2025 and message was sent to pt. Provided her  with the phone number and encouraged her to schedule. Verbalized understanding.   Preoperative respiratory examination Moderate risk. Factors that increase the risk for postoperative pulmonary complications are emphysema, body habitus, smoker   Respiratory complications generally occur in 1% of ASA Class I patients, 5% of ASA Class II and 10% of ASA Class III-IV patients These complications rarely result in mortality and include postoperative pneumonia, atelectasis, pulmonary embolism, ARDS and increased time requiring postoperative mechanical ventilation.   Overall, I recommend proceeding with the surgery if the risk for respiratory complications are outweighed by the potential benefits. This will need to be discussed between the patient and surgeon.   To reduce risks of respiratory complications, I recommend: --Pre- and post-operative incentive spirometry performed frequently while awake --Inpatient use of currently prescribed bronchodilators   --Short duration of surgery as much as possible and avoid paralytic if possible --OOB, encourage mobility post-op   Advised if symptoms do not improve or worsen, to please contact office for sooner follow up or seek emergency care.   I spent 32 minutes of dedicated to the care of this patient on the date of this encounter to include pre-visit review of records, face-to-face time with the patient discussing conditions above, post visit ordering of testing, clinical documentation with the electronic health record, making appropriate referrals as documented, and communicating necessary findings to members of the patients care team.  Comer LULLA Rouleau, NP 02/02/2024  Pt aware and understands NP's role.

## 2024-02-02 NOTE — Assessment & Plan Note (Signed)
 Active smoker with significant hx. Lung RADS 2 11/2021. Overdue for follow up. Referral placed in June 2025 and message was sent to pt. Provided her with the phone number and encouraged her to schedule. Verbalized understanding.

## 2024-02-05 NOTE — Telephone Encounter (Signed)
 Preoperative respiratory examination Moderate risk. Factors that increase the risk for postoperative pulmonary complications are emphysema, body habitus, smoker    Respiratory complications generally occur in 1% of ASA Class I patients, 5% of ASA Class II and 10% of ASA Class III-IV patients These complications rarely result in mortality and include postoperative pneumonia, atelectasis, pulmonary embolism, ARDS and increased time requiring postoperative mechanical ventilation.   Overall, I recommend proceeding with the surgery if the risk for respiratory complications are outweighed by the potential benefits. This will need to be discussed between the patient and surgeon.   To reduce risks of respiratory complications, I recommend: --Pre- and post-operative incentive spirometry performed frequently while awake --Inpatient use of currently prescribed bronchodilators   --Short duration of surgery as much as possible and avoid paralytic if possible --OOB, encourage mobility post-op   Comer LULLA Rouleau, NP 02/02/2024

## 2024-02-10 ENCOUNTER — Ambulatory Visit: Payer: Self-pay

## 2024-02-12 ENCOUNTER — Telehealth: Payer: Self-pay

## 2024-02-12 ENCOUNTER — Other Ambulatory Visit: Payer: Self-pay

## 2024-02-12 NOTE — Telephone Encounter (Signed)
   Pre-operative Risk Assessment    Patient Name: Kristen Wong  DOB: [birth year] MRN: [id number]   Date of last office visit: 01/26/24 EVALENE LUNGER, MD Date of next office visit: NONE   Request for Surgical Clearance    Procedure:  ERCP  Date of Surgery:  Clearance 03/01/24                                Surgeon:  NOT INDICATED Surgeon's Group or Practice Name:  [facility] Phone number:  [phone] Fax number:  [phone]   ATTN: MELANIE   Type of Clearance Requested:   - Medical  - Pharmacy:  Hold Aspirin     Type of Anesthesia:  General    Additional requests/questions:    SignedLucie DELENA Ku   02/12/2024, 2:45 PM

## 2024-02-18 NOTE — Progress Notes (Signed)
 Complex Care Management Care Guide Note  02/18/2024 Name: Kristen Wong MRN: [id number] DOB: [birth year]  Kristen Wong is a 58 y.o. year old female who is a primary care patient of Bair, Kalpana, MD and is actively engaged with the care management team.   Follow up plan: Patient appointment completed 01/29/24 with Manuelita Kobs, [facility].   [name]  [facility], [facility] VBCI Assistant Direct Dial: [phone]  Fax: [phone]

## 2024-02-27 ENCOUNTER — Other Ambulatory Visit

## 2024-03-01 ENCOUNTER — Ambulatory Visit

## 2024-03-01 ENCOUNTER — Encounter
Admission: RE | Disposition: A | Discharge status: Home / Self Care | Payer: Self-pay | Source: Home / Self Care | Attending: Gastroenterology

## 2024-03-01 ENCOUNTER — Ambulatory Visit
Admission: RE | Admit: 2024-03-01 | Discharge: 2024-03-01 | Disposition: A | Discharge status: Home / Self Care | Payer: Self-pay | Attending: Gastroenterology | Admitting: Gastroenterology

## 2024-03-01 ENCOUNTER — Encounter: Payer: Self-pay | Admitting: Gastroenterology

## 2024-03-01 DIAGNOSIS — K219 Gastro-esophageal reflux disease without esophagitis: Secondary | ICD-10-CM | POA: Insufficient documentation

## 2024-03-01 DIAGNOSIS — Z7984 Long term (current) use of oral hypoglycemic drugs: Secondary | ICD-10-CM | POA: Diagnosis not present

## 2024-03-01 DIAGNOSIS — K838 Other specified diseases of biliary tract: Secondary | ICD-10-CM | POA: Diagnosis not present

## 2024-03-01 DIAGNOSIS — E119 Type 2 diabetes mellitus without complications: Secondary | ICD-10-CM | POA: Insufficient documentation

## 2024-03-01 DIAGNOSIS — K839 Disease of biliary tract, unspecified: Secondary | ICD-10-CM

## 2024-03-01 DIAGNOSIS — Z4659 Encounter for fitting and adjustment of other gastrointestinal appliance and device: Secondary | ICD-10-CM

## 2024-03-01 DIAGNOSIS — F17210 Nicotine dependence, cigarettes, uncomplicated: Secondary | ICD-10-CM | POA: Insufficient documentation

## 2024-03-01 DIAGNOSIS — I10 Essential (primary) hypertension: Secondary | ICD-10-CM | POA: Insufficient documentation

## 2024-03-01 DIAGNOSIS — K8020 Calculus of gallbladder without cholecystitis without obstruction: Secondary | ICD-10-CM

## 2024-03-01 DIAGNOSIS — J449 Chronic obstructive pulmonary disease, unspecified: Secondary | ICD-10-CM | POA: Diagnosis not present

## 2024-03-01 DIAGNOSIS — F32A Depression, unspecified: Secondary | ICD-10-CM | POA: Diagnosis not present

## 2024-03-01 DIAGNOSIS — Z79899 Other long term (current) drug therapy: Secondary | ICD-10-CM | POA: Diagnosis not present

## 2024-03-01 HISTORY — PX: ERCP: SHX5425

## 2024-03-01 LAB — GLUCOSE, CAPILLARY: Glucose-Capillary: 185 mg/dL — ABNORMAL HIGH (ref 70–99)

## 2024-03-01 SURGERY — ERCP, WITH INTERVENTION IF INDICATED
Anesthesia: General

## 2024-03-01 MED ORDER — DEXMEDETOMIDINE HCL IN NACL 80 MCG/20ML IV SOLN
INTRAVENOUS | Status: DC | PRN
  Administered 2024-03-01: 4 ug via INTRAVENOUS
  Administered 2024-03-01: 8 ug via INTRAVENOUS

## 2024-03-01 MED ORDER — SODIUM CHLORIDE 0.9 % IV SOLN
INTRAVENOUS | Status: DC
  Administered 2024-03-01: 20 mL/h via INTRAVENOUS

## 2024-03-01 MED ORDER — PROPOFOL 1000 MG/100ML IV EMUL
INTRAVENOUS | Status: AC
  Filled 2024-03-01: qty 100

## 2024-03-01 MED ORDER — LIDOCAINE HCL (CARDIAC) PF 100 MG/5ML IV SOSY
PREFILLED_SYRINGE | INTRAVENOUS | Status: DC | PRN
  Administered 2024-03-01: 100 mg via INTRAVENOUS

## 2024-03-01 MED ORDER — PROPOFOL 10 MG/ML IV BOLUS
INTRAVENOUS | Status: DC | PRN
  Administered 2024-03-01: 40 mg via INTRAVENOUS
  Administered 2024-03-01 (×2): 30 mg via INTRAVENOUS
  Administered 2024-03-01: 100 mg via INTRAVENOUS

## 2024-03-01 NOTE — Anesthesia Preprocedure Evaluation (Signed)
 Anesthesia Evaluation  Patient identified by MRN, date of birth, ID band Patient awake    Reviewed: Allergy & Precautions, H&P , NPO status , Patient's Chart, lab work & pertinent test results, reviewed documented beta blocker date and time   Airway Mallampati: II   Neck ROM: full    Dental  (+) Poor Dentition   Pulmonary COPD, Current Smoker and Patient abstained from smoking.   Pulmonary exam normal        Cardiovascular Exercise Tolerance: Poor hypertension, On Medications Normal cardiovascular exam+ Valvular Problems/Murmurs  Rhythm:regular Rate:Normal     Neuro/Psych  PSYCHIATRIC DISORDERS  Depression    negative neurological ROS     GI/Hepatic Neg liver ROS,GERD  Medicated,,  Endo/Other  negative endocrine ROSdiabetes, Well Controlled    Renal/GU negative Renal ROS  negative genitourinary   Musculoskeletal   Abdominal   Peds  Hematology negative hematology ROS (+)   Anesthesia Other Findings Past Medical History: 12/03/2023: Acute cholecystitis No date: Arthritis of both knees 07/28/2023: Atypical chest pain 12/04/2023: Bile duct leak 10/28/2023: Calculus of gallbladder without cholecystitis without  obstruction No date: COPD (chronic obstructive pulmonary disease) (HCC) No date: Depression No date: GERD (gastroesophageal reflux disease) No date: Heart murmur (asymptomatic) No date: Hepatic steatosis No date: Hypertension No date: Irritable bowel syndrome No date: Polycythemia 2018: T2DM (type 2 diabetes mellitus) (HCC) 07/13/2023: Vitamin B 12 deficiency Past Surgical History: No date: ABDOMINAL HYSTERECTOMY 12/04/2023: BILIARY STENT PLACEMENT     Comment:  Procedure: INSERTION, STENT, BILE DUCT;  Surgeon: Jinny Carmine, MD;  Location: [facility] ENDOSCOPY;  Service:               Endoscopy;; No date: CESAREAN SECTION 12/03/2023: CHOLECYSTECTOMY 08/08/2017: COLONOSCOPY WITH PROPOFOL ;  N/A     Comment:  Procedure: COLONOSCOPY WITH PROPOFOL ;  Surgeon: Viktoria Lamar DASEN, MD;  Location: [facility] ENDOSCOPY;  Service:               Endoscopy;  Laterality: N/A; 01/12/2021: COLONOSCOPY WITH PROPOFOL ; N/A     Comment:  Procedure: COLONOSCOPY WITH PROPOFOL ;  Surgeon:               Maryruth Ole DASEN, MD;  Location: [facility] ENDOSCOPY;                Service: Endoscopy;  Laterality: N/A;  DM 05/11/2021: COLONOSCOPY WITH PROPOFOL ; N/A     Comment:  Procedure: COLONOSCOPY WITH PROPOFOL ;  Surgeon:               Maryruth Ole DASEN, MD;  Location: [facility] ENDOSCOPY;                Service: Endoscopy;  Laterality: N/A;  DM 02/08/2022: COLONOSCOPY WITH PROPOFOL ; N/A     Comment:  Procedure: COLONOSCOPY WITH PROPOFOL ;  Surgeon:               Maryruth Ole DASEN, MD;  Location: [facility] ENDOSCOPY;                Service: Endoscopy;  Laterality: N/A; No date: DIAGNOSTIC LAPAROSCOPY 12/04/2023: ERCP; N/A     Comment:  Procedure: ERCP, WITH INTERVENTION IF INDICATED;                Surgeon: Jinny Carmine, MD;  Location: [facility] ENDOSCOPY;  Service: Endoscopy;  Laterality: N/A;  To check with               anesthesia 6/26 to confirm time this case can go with Dr               Jinny 08/08/2017: ESOPHAGOGASTRODUODENOSCOPY (EGD) WITH PROPOFOL ; N/A     Comment:  Procedure: ESOPHAGOGASTRODUODENOSCOPY (EGD) WITH               PROPOFOL ;  Surgeon: Viktoria Lamar DASEN, MD;  Location:               [facility] ENDOSCOPY;  Service: Endoscopy;  Laterality: N/A; No date: KNEE SURGERY     Comment:  [facility] 01/22/2017: LAPAROSCOPIC HYSTERECTOMY; Bilateral     Comment:  Procedure: HYSTERECTOMY TOTAL LAPAROSCOPIC BSO;                Surgeon: Lake Read, MD;  Location: [facility] ORS;                Service: Gynecology;  Laterality: Bilateral; BMI    Body Mass Index: 33.30 kg/m     Reproductive/Obstetrics negative OB ROS                              Anesthesia  Physical Anesthesia Plan  ASA: 3  Anesthesia Plan: General   Post-op Pain Management:    Induction:   PONV Risk Score and Plan:   Airway Management Planned:   Additional Equipment:   Intra-op Plan:   Post-operative Plan:   Informed Consent: I have reviewed the patients History and Physical, chart, labs and discussed the procedure including the risks, benefits and alternatives for the proposed anesthesia with the patient or authorized representative who has indicated his/her understanding and acceptance.     Dental Advisory Given  Plan Discussed with: CRNA  Anesthesia Plan Comments:         Anesthesia Quick Evaluation

## 2024-03-01 NOTE — Transfer of Care (Signed)
 Immediate Anesthesia Transfer of Care Note  Patient: Kristen Wong  Procedure(s) Performed: ERCP, WITH INTERVENTION IF INDICATED  Patient Location: PACU  Anesthesia Type:General  Level of Consciousness: drowsy  Airway & Oxygen Therapy: Patient Spontanous Breathing  Post-op Assessment: Report given to RN and Post -op Vital signs reviewed and stable  Post vital signs: Reviewed and stable  Last Vitals:  Vitals Value Taken Time  BP    Temp    Pulse    Resp    SpO2      Last Pain:  Vitals:   03/01/24 1010  TempSrc: Temporal  PainSc: 0-No pain         Complications: No notable events documented.

## 2024-03-01 NOTE — Anesthesia Postprocedure Evaluation (Signed)
 Anesthesia Post Note  Patient: Kristen Wong  Procedure(s) Performed: ERCP, WITH INTERVENTION IF INDICATED  Patient location during evaluation: PACU Anesthesia Type: General Level of consciousness: awake and alert Pain management: pain level controlled Vital Signs Assessment: post-procedure vital signs reviewed and stable Respiratory status: spontaneous breathing, nonlabored ventilation, respiratory function stable and patient connected to nasal cannula oxygen Cardiovascular status: blood pressure returned to baseline and stable Postop Assessment: no apparent nausea or vomiting Anesthetic complications: no   No notable events documented.   Last Vitals:  Vitals:   03/01/24 1133 03/01/24 1143  BP: 128/61 (!) 123/58  Pulse: 84 80  Resp: (!) 22 (!) 23  Temp:    SpO2: 100% 100%    Last Pain:  Vitals:   03/01/24 1143  TempSrc:   PainSc: 0-No pain                 Lynwood KANDICE Clause

## 2024-03-01 NOTE — Telephone Encounter (Signed)
   Patient Name: Kristen Wong  DOB: [birth year] MRN: [id number]  Primary Cardiologist: Dr. Perla  Chart reviewed as part of pre-operative protocol coverage. Patient was recently seen by Dr. Gollan on 01/26/2024 at which time she reports chronic shortness of breath but no chest pain. Dyspnea felt to likely be due to COPD but Echo was ordered to rule out structural heart disease and to evaluate for valvular disease or pulmonary hypertension. However, per Dr. Gollan, this does not have to be completed prior to ERCP which is a low risk procedure and patient is OK to proceed with planned procedure. Main risk felt to be potential respiratory complications (from her history of COPD and significant tobacco use) rather than a cardiac complication. Per Dr. Gollan, will defer pulmonary pathology to anesthesia team.  I will route this recommendation to the requesting party via Epic fax function and remove from pre-op pool.  Please call with questions.  Maniya Donovan E Vernia Teem, PA-C 03/01/2024, 7:08 AM

## 2024-03-01 NOTE — H&P (Signed)
 Kristen Copping, MD [facility] [address] [place], [place] [address] Phone:[phone] Fax : [phone]  Primary Care Physician:  Kristen Bruckner, MD Primary Gastroenterologist:  Dr. Copping  Pre-Procedure History & Physical: HPI:  Kristen Wong is a 58 y.o. female is here for an ERCP.   Past Medical History:  Diagnosis Date   Acute cholecystitis 12/03/2023   Arthritis of both knees    Atypical chest pain 07/28/2023   Bile duct leak 12/04/2023   Calculus of gallbladder without cholecystitis without obstruction 10/28/2023   COPD (chronic obstructive pulmonary disease) (HCC)    Depression    GERD (gastroesophageal reflux disease)    Heart murmur (asymptomatic)    Hepatic steatosis    Hypertension    Irritable bowel syndrome    Polycythemia    T2DM (type 2 diabetes mellitus) (HCC) 2018   Vitamin B 12 deficiency 07/13/2023    Past Surgical History:  Procedure Laterality Date   ABDOMINAL HYSTERECTOMY     BILIARY STENT PLACEMENT  12/04/2023   Procedure: INSERTION, STENT, BILE DUCT;  Surgeon: Wong Rogelia, MD;  Location: [facility] ENDOSCOPY;  Service: Endoscopy;;   CESAREAN SECTION     CHOLECYSTECTOMY  12/03/2023   COLONOSCOPY WITH PROPOFOL  N/A 08/08/2017   Procedure: COLONOSCOPY WITH PROPOFOL ;  Surgeon: Kristen Lamar DASEN, MD;  Location: [facility] ENDOSCOPY;  Service: Endoscopy;  Laterality: N/A;   COLONOSCOPY WITH PROPOFOL  N/A 01/12/2021   Procedure: COLONOSCOPY WITH PROPOFOL ;  Surgeon: Kristen Ole DASEN, MD;  Location: [facility] ENDOSCOPY;  Service: Endoscopy;  Laterality: N/A;  DM   COLONOSCOPY WITH PROPOFOL  N/A 05/11/2021   Procedure: COLONOSCOPY WITH PROPOFOL ;  Surgeon: Kristen Ole DASEN, MD;  Location: [facility] ENDOSCOPY;  Service: Endoscopy;  Laterality: N/A;  DM   COLONOSCOPY WITH PROPOFOL  N/A 02/08/2022   Procedure: COLONOSCOPY WITH PROPOFOL ;  Surgeon: Kristen Ole DASEN, MD;  Location: [facility] ENDOSCOPY;  Service: Endoscopy;  Laterality: N/A;   DIAGNOSTIC LAPAROSCOPY     ERCP N/A  12/04/2023   Procedure: ERCP, WITH INTERVENTION IF INDICATED;  Surgeon: Wong Rogelia, MD;  Location: [facility] ENDOSCOPY;  Service: Endoscopy;  Laterality: N/A;  To check with anesthesia 6/26 to confirm time this case can go with Dr Wong   ESOPHAGOGASTRODUODENOSCOPY (EGD) WITH PROPOFOL  N/A 08/08/2017   Procedure: ESOPHAGOGASTRODUODENOSCOPY (EGD) WITH PROPOFOL ;  Surgeon: Kristen Lamar DASEN, MD;  Location: [facility] ENDOSCOPY;  Service: Endoscopy;  Laterality: N/A;   KNEE SURGERY     [facility]   LAPAROSCOPIC HYSTERECTOMY Bilateral 01/22/2017   Procedure: HYSTERECTOMY TOTAL LAPAROSCOPIC BSO;  Surgeon: Kristen Read, MD;  Location: [facility] ORS;  Service: Gynecology;  Laterality: Bilateral;    Prior to Admission medications   Medication Sig Start Date End Date Taking? Authorizing Provider  acetaminophen  (TYLENOL ) 500 MG tablet Take 1,000 mg by mouth every 6 (six) hours as needed.   Yes [provider]  albuterol  (VENTOLIN  HFA) 108 (90 Base) MCG/ACT inhaler Inhale 1-2 puffs into the lungs every 6 (six) hours as needed. 07/28/23 07/27/24 Yes Hope Merle, MD  aspirin EC 81 MG tablet Take 81 mg by mouth daily. Swallow whole.   Yes [provider]  atorvastatin  (LIPITOR) 20 MG tablet Take 1 tablet (20 mg total) by mouth daily. 01/15/24 01/14/25 Yes Bair, Kalpana, MD  busPIRone  (BUSPAR ) 7.5 MG tablet Take 1 tablet (7.5 mg total) by mouth 2 (two) times daily. 01/15/24  Yes Bair, Kalpana, MD  carvedilol  (COREG ) 3.125 MG tablet Take 1 tablet (3.125 mg total) by mouth 2 (two) times daily with a meal. 01/15/24  Yes Bair, Kalpana, MD  cetirizine (ZYRTEC) 10 MG tablet Take 20 mg by mouth daily.   Yes [provider]  dapagliflozin  propanediol (FARXIGA ) 10 MG TABS tablet Take 1 tablet (10 mg total) by mouth daily before breakfast. 01/15/24  Yes Bair, Kalpana, MD  escitalopram  (LEXAPRO ) 10 MG tablet Take 1 tablet (10 mg total) by mouth daily. 01/15/24  Yes Bair, Kalpana, MD  fluticasone  (FLONASE ) 50 MCG/ACT nasal  spray Place 2 sprays into both nostrils daily. 09/04/21  Yes [provider]  glipiZIDE  (GLUCOTROL ) 5 MG tablet Take 1 tablet (5 mg total) by mouth daily before breakfast. 01/19/24  Yes Bair, Kalpana, MD  metFORMIN  (GLUCOPHAGE ) 500 MG tablet Take 2 tablets (1,000 mg total) by mouth 2 (two) times daily with a meal. 01/15/24  Yes Bair, Kalpana, MD  naproxen (NAPROSYN) 500 MG tablet Take 500 mg by mouth 2 (two) times daily. 08/14/23  Yes [provider]  omeprazole  (PRILOSEC) 40 MG capsule Take 1 capsule (40 mg total) by mouth daily. 01/15/24  Yes Bair, Kalpana, MD  Spacer/Aero-Holding Chambers DEVI Use with inhaler 02/02/24  Yes Malachy Comer GAILS, NP  telmisartan  (MICARDIS ) 20 MG tablet Take 1 tablet (20 mg total) by mouth daily. 01/15/24  Yes Bair, Kalpana, MD  traZODone  (DESYREL ) 50 MG tablet Take 1 tablet (50 mg total) by mouth at bedtime as needed for sleep. 01/15/24  Yes Bair, Kalpana, MD  Vitamin D , Ergocalciferol , (DRISDOL ) 1.25 MG (50000 UNIT) CAPS capsule Take 1 capsule (50,000 Units total) by mouth every 7 (seven) days. 01/15/24  Yes Bair, Kalpana, MD    Allergies as of 01/05/2024 - Review Complete 12/30/2023  Allergen Reaction Noted   Penicillin g Nausea Only 04/12/2016    Family History  Problem Relation Age of Onset   Diabetes Mother    Emphysema Mother    Hypertension Father    Diabetes Father    Diabetes Sister    Breast cancer Neg Hx     Social History   Socioeconomic History   Marital status: Divorced    Spouse name: Not on file   Number of children: Not on file   Years of education: Not on file   Highest education level: Not on file  Occupational History   Not on file  Tobacco Use   Smoking status: Every Day    Current packs/day: 2.00    Average packs/day: 2.0 packs/day for 44.7 years (89.4 ttl pk-yrs)    Types: Cigarettes    Start date: 61    Passive exposure: Past   Smokeless tobacco: Never  Vaping Use   Vaping status: Never Used  Substance and  Sexual Activity   Alcohol use: No   Drug use: No   Sexual activity: Not Currently  Other Topics Concern   Not on file  Social History Narrative   Not on file   Social Drivers of Health   Financial Resource Strain: Low Risk  (04/16/2023)   Received from [facility]   Overall Financial Resource Strain (CARDIA)    Difficulty of Paying Living Expenses: Not hard at all  Food Insecurity: No Food Insecurity (12/08/2023)   Hunger Vital Sign    Worried About Running Out of Food in the Last Year: Never true    Ran Out of Food in the Last Year: Never true  Transportation Needs: No Transportation Needs (12/08/2023)   PRAPARE - Administrator, Civil Service (Medical): No    Lack of Transportation (Non-Medical): No  Physical Activity: Inactive (09/17/2018)   Received from [facility]   Exercise Vital Sign    On average, how many days per week do you engage in moderate to strenuous exercise (like a brisk walk)?: 0 days    On average, how many minutes do you engage in exercise at this level?: 0 min  Stress: Stress Concern Present (09/17/2018)   Received from [facility] - Occupational Stress Questionnaire    Feeling of Stress : Very much  Social Connections: Moderately Isolated (09/17/2018)   Received from [facility]   Social Connection and Isolation Panel    In a typical week, how many times do you talk on the phone with family, friends, or neighbors?: More than three times a week    How often do you get together with friends or relatives?: More than three times a week    How often do you attend church or religious services?: 1 to 4 times per year    Do you belong to any clubs or organizations such as church groups, unions, fraternal or athletic groups, or school groups?: No    How often do you attend meetings of the clubs or organizations you belong to?: Never    Are you  married, widowed, divorced, separated, never married, or living with a partner?: Divorced  Intimate Partner Violence: Not At Risk (12/08/2023)   Humiliation, Afraid, Rape, and Kick questionnaire    Fear of Current or Ex-Partner: No    Emotionally Abused: No    Physically Abused: No    Sexually Abused: No    Review of Systems: See HPI, otherwise negative ROS  Physical Exam: BP 138/69   Pulse (!) 105   Temp (!) 96.8 F (36 C) (Temporal)   Resp 20   Ht 5' 4 (1.626 m)   Wt 88 kg   SpO2 95%   BMI 33.30 kg/m  General:   Alert,  pleasant and cooperative in NAD Head:  Normocephalic and atraumatic. Neck:  Supple; no masses or thyromegaly. Lungs:  Clear throughout to auscultation.    Heart:  Regular rate and rhythm. Abdomen:  Soft, nontender and nondistended. Normal bowel sounds, without guarding, and without rebound.   Neurologic:  Alert and  oriented x4;  grossly normal neurologically.  Impression/Plan: Kristen Wong is here for an ERCP to be performed for stent removal  Risks, benefits, limitations, and alternatives regarding  ERCP have been reviewed with the patient.  Questions have been answered.  All parties agreeable.   Kristen Copping, MD  03/01/2024, 10:47 AM

## 2024-03-01 NOTE — Op Note (Signed)
 [facility] Patient Name: Kristen Wong Procedure Date: 03/01/2024 10:48 AM MRN: [id number] Account #: [id number] Date of Birth: Jul 26, 1965 Admit Type: Outpatient Age: 58 Room: [facility] ENDO ROOM 4 Gender: Female Note Status: Finalized Instrument Name: CELINDA [id number] Procedure:             ERCP Indications:           Stent removal Providers:             Rogelia Copping MD, MD Referring MD:          Luke Shade MD Medicines:             Propofol  per Anesthesia Complications:         No immediate complications. Procedure:             Pre-Anesthesia Assessment:                        - Prior to the procedure, a History and Physical was                         performed, and patient medications and allergies were                         reviewed. The patient's tolerance of previous                         anesthesia was also reviewed. The risks and benefits                         of the procedure and the sedation options and risks                         were discussed with the patient. All questions were                         answered, and informed consent was obtained. Prior                         Anticoagulants: The patient has taken no anticoagulant                         or antiplatelet agents. ASA Grade Assessment: II - A                         patient with mild systemic disease. After reviewing                         the risks and benefits, the patient was deemed in                         satisfactory condition to undergo the procedure.                        After obtaining informed consent, the scope was passed                         under direct vision. Throughout the procedure, the  patient's blood pressure, pulse, and oxygen                         saturations were monitored continuously. The                         Duodenoscope was introduced through the mouth, and                         used to inject contrast  into and used to inject                         contrast into the bile duct. The ERCP was accomplished                         without difficulty. The patient tolerated the                         procedure well. Findings:      A scout film of the abdomen was obtained. One stent ending in the main       bile duct was seen. The esophagus was successfully intubated under       direct vision. The scope was advanced to a normal major papilla in the       descending duodenum without detailed examination of the pharynx, larynx       and associated structures, and upper GI tract. The upper GI tract was       grossly normal. One stent was removed from the common bile duct using a       snare. The bile duct was deeply cannulated with the short-nosed traction       sphincterotome. Contrast was injected. I personally interpreted the bile       duct images. There was brisk flow of contrast through the ducts. Image       quality was excellent. Contrast extended to the entire biliary tree.       Bile duct leak was resolved. A wire was passed into the biliary tree.       The biliary tree was swept with a 15 mm balloon starting at the       bifurcation. Sludge was swept from the duct. Impression:            - Bile duct leak resolved.                        - One stent was removed from the common bile duct.                        - The biliary tree was swept and sludge was found. Recommendation:        - Discharge patient to home.                        - Resume previous diet.                        - Watch for pancreatitis, bleeding, perforation, and                         cholangitis. Procedure Code(s):     --- Professional ---  56724, Endoscopic retrograde cholangiopancreatography                         (ERCP); with removal of foreign body(s) or stent(s)                         from biliary/pancreatic duct(s)                        43264, Endoscopic retrograde  cholangiopancreatography                         (ERCP); with removal of calculi/debris from                         biliary/pancreatic duct(s)                        25671, Endoscopic catheterization of the biliary                         ductal system, radiological supervision and                         interpretation Diagnosis Code(s):     --- Professional ---                        S53.40, Encounter for fitting and adjustment of other                         gastrointestinal appliance and device CPT copyright 2022 American Medical Association. All rights reserved. The codes documented in this report are preliminary and upon coder review may  be revised to meet current compliance requirements. Rogelia Copping MD, MD 03/01/2024 11:20:21 AM This report has been signed electronically. Number of Addenda: 0 Note Initiated On: 03/01/2024 10:48 AM Estimated Blood Loss:  Estimated blood loss: none.      [facility]

## 2024-03-02 ENCOUNTER — Telehealth: Payer: Self-pay

## 2024-03-02 NOTE — Telephone Encounter (Signed)
 FYI  Pt called to report that she has started to have abdominal pain this morning RUQ, she did not have pain yesterday or last night following the procedure... Pt reports area is tender to touch... Denies fever/chills or any other Sx at this time... Pt was told to go to ED today and went over the possible risks if she is not evaluated urgently.... Pt stated that she will be going to ED today

## 2024-03-03 NOTE — Telephone Encounter (Signed)
 I spoke to pt and she stated that she wanted to see if she would improve... Pt still reports some discomfort today, improved from yesterday, but still present... Denies any new Sx or fever/chills... I told pt that it is still best she gets seen urgently... Patient states she will take her chances and if her Sx seemed to worsen or develop chills or fever she will go to ED.SABRASABRASABRA

## 2024-03-09 ENCOUNTER — Other Ambulatory Visit

## 2024-03-19 ENCOUNTER — Other Ambulatory Visit: Payer: Self-pay

## 2024-03-24 ENCOUNTER — Other Ambulatory Visit: Payer: Self-pay

## 2024-03-24 DIAGNOSIS — E559 Vitamin D deficiency, unspecified: Secondary | ICD-10-CM

## 2024-03-25 ENCOUNTER — Other Ambulatory Visit: Payer: Self-pay

## 2024-03-25 DIAGNOSIS — E559 Vitamin D deficiency, unspecified: Secondary | ICD-10-CM

## 2024-03-25 MED FILL — Ergocalciferol Cap 1.25 MG (50000 Unit): ORAL | 84 days supply | Qty: 12 | Fill #0 | Status: AC

## 2024-03-29 ENCOUNTER — Other Ambulatory Visit: Payer: Self-pay

## 2024-03-29 DIAGNOSIS — E118 Type 2 diabetes mellitus with unspecified complications: Secondary | ICD-10-CM

## 2024-03-29 MED ORDER — METFORMIN HCL 500 MG PO TABS
1000.0000 mg | ORAL_TABLET | Freq: Two times a day (BID) | ORAL | 1 refills | Status: DC
  Filled 2024-03-29 – 2024-03-30 (×2): qty 60, 15d supply, fill #0
  Filled 2024-04-14: qty 60, 15d supply, fill #1

## 2024-03-30 ENCOUNTER — Other Ambulatory Visit: Payer: Self-pay

## 2024-04-03 ENCOUNTER — Other Ambulatory Visit: Payer: Self-pay

## 2024-04-13 ENCOUNTER — Ambulatory Visit: Attending: Cardiovascular Disease

## 2024-04-13 DIAGNOSIS — R0602 Shortness of breath: Secondary | ICD-10-CM | POA: Insufficient documentation

## 2024-04-13 LAB — ECHOCARDIOGRAM COMPLETE
AR max vel: 2.82 cm2
AV Area VTI: 3.25 cm2
AV Area mean vel: 2.93 cm2
AV Mean grad: 3 mmHg
AV Peak grad: 5.5 mmHg
Ao pk vel: 1.17 m/s
Area-P 1/2: 3.6 cm2
S' Lateral: 3.4 cm

## 2024-04-15 ENCOUNTER — Telehealth: Payer: Self-pay | Admitting: Cardiovascular Disease

## 2024-04-15 NOTE — Telephone Encounter (Signed)
Pt calling to f/u on Echo results. Please advise 

## 2024-04-15 NOTE — Telephone Encounter (Signed)
 Spoke with patient. Informed patient that Dr. Gollan has not had a chance to review the Echocardiogram results at this time. Patient informed that once Dr. Gollan has reviewed the results that she would be notified of the results. Patient verbalized understanding and appreciation for call.

## 2024-04-16 ENCOUNTER — Ambulatory Visit: Payer: Self-pay | Admitting: Cardiovascular Disease

## 2024-04-18 ENCOUNTER — Other Ambulatory Visit: Payer: Self-pay

## 2024-04-26 ENCOUNTER — Other Ambulatory Visit: Payer: Self-pay

## 2024-04-26 DIAGNOSIS — E559 Vitamin D deficiency, unspecified: Secondary | ICD-10-CM

## 2024-04-26 DIAGNOSIS — E118 Type 2 diabetes mellitus with unspecified complications: Secondary | ICD-10-CM

## 2024-04-27 ENCOUNTER — Other Ambulatory Visit: Payer: Self-pay

## 2024-04-27 MED FILL — Metformin HCl Tab 500 MG: ORAL | 15 days supply | Qty: 60 | Fill #0 | Status: AC

## 2024-04-27 NOTE — Telephone Encounter (Signed)
 Last refill on Vitamin D  50,000 units, once weekly for 90 days was sent on  03/25/24, which should last her till 06/25/2024. Refill request too soon. Please follow up with pharmacy and patient to assure she is taking vitamin D  as prescribed and does not need refill till 06/25/24.    Luke Shade, MD

## 2024-05-10 ENCOUNTER — Other Ambulatory Visit: Payer: Self-pay

## 2024-05-15 ENCOUNTER — Other Ambulatory Visit: Payer: Self-pay

## 2024-05-15 DIAGNOSIS — E559 Vitamin D deficiency, unspecified: Secondary | ICD-10-CM

## 2024-05-16 ENCOUNTER — Other Ambulatory Visit: Payer: Self-pay

## 2024-05-17 ENCOUNTER — Other Ambulatory Visit: Payer: Self-pay

## 2024-05-17 DIAGNOSIS — E559 Vitamin D deficiency, unspecified: Secondary | ICD-10-CM

## 2024-05-17 NOTE — Telephone Encounter (Signed)
 She does not need repeat lab.  She can start taking over the counter vitamin D  1000 units daily.  Vitamin D  normalized on 01/15/24.   Thank you,  Luke Shade, MD

## 2024-05-18 ENCOUNTER — Telehealth: Payer: Self-pay

## 2024-05-18 ENCOUNTER — Other Ambulatory Visit: Payer: Self-pay

## 2024-05-18 ENCOUNTER — Other Ambulatory Visit (HOSPITAL_COMMUNITY): Payer: Self-pay

## 2024-05-18 NOTE — Telephone Encounter (Signed)
 Received a fax from [facility] stating PA is needed for Rx Telmisartan  20 mg tablet.

## 2024-05-24 ENCOUNTER — Ambulatory Visit: Payer: Self-pay

## 2024-05-24 ENCOUNTER — Other Ambulatory Visit: Payer: Self-pay

## 2024-05-24 ENCOUNTER — Telehealth: Payer: Self-pay

## 2024-05-24 ENCOUNTER — Other Ambulatory Visit (HOSPITAL_COMMUNITY): Payer: Self-pay

## 2024-05-24 VITALS — BP 140/80 | HR 104 | Temp 97.5°F | Ht 64.0 in | Wt 203.4 lb

## 2024-05-24 DIAGNOSIS — M1990 Unspecified osteoarthritis, unspecified site: Secondary | ICD-10-CM

## 2024-05-24 DIAGNOSIS — J310 Chronic rhinitis: Secondary | ICD-10-CM | POA: Insufficient documentation

## 2024-05-24 DIAGNOSIS — J449 Chronic obstructive pulmonary disease, unspecified: Secondary | ICD-10-CM | POA: Insufficient documentation

## 2024-05-24 DIAGNOSIS — E1129 Type 2 diabetes mellitus with other diabetic kidney complication: Secondary | ICD-10-CM | POA: Insufficient documentation

## 2024-05-24 DIAGNOSIS — I152 Hypertension secondary to endocrine disorders: Secondary | ICD-10-CM

## 2024-05-24 DIAGNOSIS — E118 Type 2 diabetes mellitus with unspecified complications: Secondary | ICD-10-CM

## 2024-05-24 DIAGNOSIS — L219 Seborrheic dermatitis, unspecified: Secondary | ICD-10-CM

## 2024-05-24 LAB — HEMOGLOBIN A1C: Hgb A1c MFr Bld: 7 % — ABNORMAL HIGH (ref 4.6–6.5)

## 2024-05-24 LAB — MICROALBUMIN / CREATININE URINE RATIO
Creatinine,U: 94.5 mg/dL
Microalb Creat Ratio: 209.9 mg/g — ABNORMAL HIGH (ref 0.0–30.0)
Microalb, Ur: 19.8 mg/dL — ABNORMAL HIGH (ref 0.0–1.9)

## 2024-05-24 MED ORDER — NAPROXEN 500 MG PO TABS
500.0000 mg | ORAL_TABLET | Freq: Two times a day (BID) | ORAL | 0 refills | Status: AC | PRN
  Filled 2024-05-24: qty 60, 30d supply, fill #0

## 2024-05-24 MED ORDER — SHINGRIX 50 MCG/0.5ML IM SUSR
0.5000 mL | Freq: Once | INTRAMUSCULAR | 1 refills | Status: AC
  Filled 2024-05-24: qty 0.5, 1d supply, fill #0

## 2024-05-24 MED ORDER — LOSARTAN POTASSIUM-HCTZ 50-12.5 MG PO TABS
1.0000 | ORAL_TABLET | Freq: Every day | ORAL | 0 refills | Status: DC
  Filled 2024-05-24: qty 90, 90d supply, fill #0

## 2024-05-24 MED ORDER — SPIRIVA RESPIMAT 2.5 MCG/ACT IN AERS
2.0000 | INHALATION_SPRAY | Freq: Every day | RESPIRATORY_TRACT | 3 refills | Status: AC
  Filled 2024-05-24: qty 4, 30d supply, fill #0
  Filled 2024-06-15 – 2024-07-09 (×3): qty 4, 30d supply, fill #1

## 2024-05-24 MED ORDER — KETOCONAZOLE 2 % EX SHAM
1.0000 | MEDICATED_SHAMPOO | CUTANEOUS | 1 refills | Status: AC
  Filled 2024-05-24: qty 120, 30d supply, fill #0
  Filled 2024-07-09: qty 120, 30d supply, fill #1

## 2024-05-24 MED ORDER — FLUZONE 0.5 ML IM SUSY
0.5000 mL | PREFILLED_SYRINGE | Freq: Once | INTRAMUSCULAR | 0 refills | Status: AC
  Filled 2024-05-24: qty 0.5, 1d supply, fill #0

## 2024-05-24 MED ORDER — FLUTICASONE PROPIONATE 50 MCG/ACT NA SUSP
2.0000 | Freq: Every day | NASAL | 2 refills | Status: AC
  Filled 2024-05-24: qty 16, 30d supply, fill #0
  Filled 2024-07-01: qty 16, 30d supply, fill #1
  Filled 2024-07-09: qty 16, 30d supply, fill #2

## 2024-05-24 NOTE — Telephone Encounter (Signed)
 Please defer to my note from 05/24/24. Telmisartan  discontinued. Starting her on Losartan -hydrochlorothiazide .    Luke Shade, MD

## 2024-05-24 NOTE — Progress Notes (Signed)
 Please let the patient know her kidney function is worsened compared to her last lab from 4 months ago. I recommend referral to nephrologist for further evaluation. If she is agreeable please sign on the pended nephrology referral. If not please cancel the order.    Diabetes is improved from 8.3% to 7%.   Thank you,  Luke Shade, MD

## 2024-05-24 NOTE — Patient Instructions (Addendum)
-   Take multivitamin daily. - I am sending you prescription for nasal flonase , use this 2 puffs in each nostril daily.  - Start Losartan -hydrochlorothiazide  , one tablet daily in the morning. Please take this in the morning, please anticipate peeing more than usual.  Your BP goal is less than 130/80 mmHg. Please utilize DASH diet, attached to help improve blood pressure.  - Use Spiriva  2 puff once daily for COPD and use Albuterol  inhaler as needed when you have wheezing, cough.  - Please take Naproxen  sparingly, take one tablet twice a day with food as needed.  - You are due for COVID-19, shingles, you can update these through your local pharmacy.  - Follow up in 3 months.

## 2024-05-24 NOTE — Assessment & Plan Note (Addendum)
 Pain both knees, back, hands worse in winter weather. Naproxen  poses risks to kidney function and GI tract. Prescribed naproxen  500 mg BID prn. 60 tablets as needed.Recommended diclofenac  gel for localized pain. Appropriate for renewal of disability parking placard which will be filled out and we will reach out to the patient once this is done for her to pick it up.  Orders:   naproxen  (NAPROSYN ) 500 MG tablet; Take 1 tablet (500 mg total) by mouth 2 (two) times daily as needed.

## 2024-05-24 NOTE — Assessment & Plan Note (Addendum)
 COPD management includes rescue albuterol  inhaler only, continues to smoking and declines smoking cessation counseling at this time.  Prescribed Spiriva  2 puffs once daily for maintenance therapy.  Continue albuterol  inhaler as needed. Encouraged smoking cessation. Orders:   Tiotropium Bromide  (SPIRIVA  RESPIMAT) 2.5 MCG/ACT AERS; Inhale 2 puffs into the lungs daily.   AMB Referral [facility]

## 2024-05-24 NOTE — Telephone Encounter (Signed)
 PA request has been Cancelled. New Encounter has been or will be created for follow up. For additional info see Pharmacy Prior Auth telephone encounter from 05/24/2024.

## 2024-05-24 NOTE — Assessment & Plan Note (Addendum)
 Plan per type II DM with complications Orders:   Urine Microalbumin w/creat. ratio   losartan -hydrochlorothiazide  (HYZAAR ) 50-12.5 MG tablet; Take 1 tablet by mouth daily.

## 2024-05-24 NOTE — Progress Notes (Signed)
 Established Patient Office Visit   Subjective  Patient ID: Kristen Wong, female    DOB: March 18, 1966  Age: 58 y.o. MRN: [id number]  Chief Complaint  Patient presents with   Diabetes   Hypertension   Hyperlipidemia   Gastroesophageal Reflux   Insomnia    Discussed the use of AI scribe software for clinical note transcription with the patient, who gave verbal consent to proceed.  History of Present Illness Kristen Wong is a 58 year old female with diabetic kidney disease and COPD who presents for medication management and follow-up.  - COPD, centrilobular emphysema (on LDCT), current everyday smoker:  Breztri  (unable to afford) On Albuterol  only, not on maintainance.  Last OV with NP Cobb on 02/02/24, was recommended to f/u in 6 months with Dr. Isaiah  She continues to smoke and is not interested in smoking cessation at this time.    Saw cardiologist Dr. Gollan on 01/26/24: believes shortness of breath is likely from deconditioning. Echo ressuring per note from 04/16/24   Saw Dr. Jinny: ERCP on 03/01/24, no abdominal pain, nausea, vomiting.   Vitamin D  deficiency, resolved, vitamin D  normal at 40.07  Uncontrolled type II DM with complications:  +urine microalbuminuria on 01/15/24, Farxiga  approved on 01/29/24 unable to tolerate it due vaginal itchiness, constipation. She stopped this on her own. She never started Telmisartan .  Continues to work on lifestyle modifications including reducing carbohydrate consumption.   Polyarthralgia: Knees, hand, back pain, worse during cold weather. Previously treated with Naproxen  prn by [facility]. Requesting refill. Requesting renewal of disability parking placard renewed.        ROS As per HPI    Objective:     BP (!) 140/80 (BP Location: Right Arm, Patient Position: Sitting, Cuff Size: Normal)   Pulse (!) 104   Temp (!) 97.5 F (36.4 C) (Oral)   Ht 5' 4 (1.626 m)   Wt 203 lb 6.4 oz (92.3 kg)   SpO2 94%   BMI 34.91 kg/m       05/24/2024   10:14 AM 01/15/2024    2:57 PM 10/28/2023    9:13 AM  Depression screen PHQ 2/9  Decreased Interest 0 3 3  Down, Depressed, Hopeless 3 3 3   PHQ - 2 Score 3 6 6   Altered sleeping 3 3 3   Tired, decreased energy 3 3 3   Change in appetite 2 3 2   Feeling bad or failure about yourself  0 2 2  Trouble concentrating 0 0 0  Moving slowly or fidgety/restless 0 0 3  Suicidal thoughts 0 0 0  PHQ-9 Score 11 17  19    Difficult doing work/chores Somewhat difficult Somewhat difficult      Data saved with a previous flowsheet row definition      05/24/2024   10:14 AM 01/15/2024    2:57 PM 10/28/2023    9:13 AM 07/28/2023    3:40 PM  GAD 7 : Generalized Anxiety Score  Nervous, Anxious, on Edge 2 3 1 2   Control/stop worrying 2 2 3 3   Worry too much - different things 2 2 2 3   Trouble relaxing 0 2 3 1   Restless 1 1 1  0  Easily annoyed or irritable 1 2 1  0  Afraid - awful might happen 0 1 3 2   Total GAD 7 Score 8 13 14 11   Anxiety Difficulty Somewhat difficult Somewhat difficult Somewhat difficult Somewhat difficult      05/24/2024   10:14 AM 01/15/2024  2:57 PM 10/28/2023    9:13 AM  Depression screen PHQ 2/9  Decreased Interest 0 3 3  Down, Depressed, Hopeless 3 3 3   PHQ - 2 Score 3 6 6   Altered sleeping 3 3 3   Tired, decreased energy 3 3 3   Change in appetite 2 3 2   Feeling bad or failure about yourself  0 2 2  Trouble concentrating 0 0 0  Moving slowly or fidgety/restless 0 0 3  Suicidal thoughts 0 0 0  PHQ-9 Score 11 17  19    Difficult doing work/chores Somewhat difficult Somewhat difficult      Data saved with a previous flowsheet row definition      05/24/2024   10:14 AM 01/15/2024    2:57 PM 10/28/2023    9:13 AM 07/28/2023    3:40 PM  GAD 7 : Generalized Anxiety Score  Nervous, Anxious, on Edge 2 3 1 2   Control/stop worrying 2 2 3 3   Worry too much - different things 2 2 2 3   Trouble relaxing 0 2 3 1   Restless 1 1 1  0  Easily annoyed or irritable 1 2 1  0   Afraid - awful might happen 0 1 3 2   Total GAD 7 Score 8 13 14 11   Anxiety Difficulty Somewhat difficult Somewhat difficult Somewhat difficult Somewhat difficult   SDOH Screenings   Food Insecurity: No Food Insecurity (12/08/2023)  Housing: Unknown (12/08/2023)  Transportation Needs: No Transportation Needs (12/08/2023)  Utilities: Not At Risk (12/08/2023)  Depression (PHQ2-9): High Risk (05/24/2024)  Financial Resource Strain: Low Risk  (04/16/2023)   Received from [facility]  Tobacco Use: High Risk (05/24/2024)     Physical Exam Constitutional:      Appearance: Normal appearance.  HENT:     Head: Normocephalic and atraumatic.     Right Ear: Tympanic membrane normal.     Left Ear: Tympanic membrane normal.     Mouth/Throat:     Mouth: Mucous membranes are moist.  Neck:     Thyroid: No thyroid mass or thyroid tenderness.  Cardiovascular:     Rate and Rhythm: Normal rate and regular rhythm.  Pulmonary:     Effort: Pulmonary effort is normal.     Breath sounds: Normal breath sounds.  Abdominal:     General: Bowel sounds are normal.     Palpations: Abdomen is soft.     Tenderness: There is no abdominal tenderness.  Musculoskeletal:     Cervical back: Neck supple. No rigidity.     Right lower leg: No edema.     Left lower leg: No edema.  Skin:    General: Skin is warm and dry.     Comments: Seborrhoic dermatitis of scalp: Greasy, white flakes noted on parietal region without pustules.   Neurological:     Mental Status: She is alert and oriented to person, place, and time.  Psychiatric:        Mood and Affect: Mood normal.        Behavior: Behavior normal.        No results found for any visits on 05/24/24.  The ASCVD Risk score (Arnett DK, et al., 2019) failed to calculate for the following reasons:   Cannot find a previous HDL lab   Cannot find a previous total cholesterol lab     Assessment & Plan:   Assessment & Plan Chronic  rhinitis Symptoms of nasal congestion. Prescribed Flonase  nasal spray, 2 puffs in each nostril daily. Orders:  fluticasone  (FLONASE ) 50 MCG/ACT nasal spray; Place 2 sprays into both nostrils daily.  Chronic obstructive pulmonary disease, unspecified COPD type (HCC) COPD management includes rescue albuterol  inhaler only, continues to smoking and declines smoking cessation counseling at this time.  Prescribed Spiriva  2 puffs once daily for maintenance therapy.  Continue albuterol  inhaler as needed. Encouraged smoking cessation. Orders:   Tiotropium Bromide  (SPIRIVA  RESPIMAT) 2.5 MCG/ACT AERS; Inhale 2 puffs into the lungs daily.   AMB Referral VBCI Care Management  Arthritis Pain both knees, back, hands worse in winter weather. Naproxen  poses risks to kidney function and GI tract. Prescribed naproxen  500 mg BID prn. 60 tablets as needed.Recommended diclofenac  gel for localized pain. Appropriate for renewal of disability parking placard which will be filled out and we will reach out to the patient once this is done for her to pick it up.  Orders:   naproxen  (NAPROSYN ) 500 MG tablet; Take 1 tablet (500 mg total) by mouth 2 (two) times daily as needed.  Type 2 diabetes mellitus with complications (HCC) Type 2 diabetes mellitus with diabetic kidney disease, hyperglycemia, hyperlipidemia and hypertension Diabetic kidney disease with intolerance to Farxiga .  Blood pressure management is crucial to prevent further kidney damage. Goal BP <130/80 mmHg. Has not started Telmisartan  20 mg daily yet due to insurance concerns, d/c Telmisartan .  Start losartan -hydrochlorothiazide  50-12.5 mg 1 tablet daily in the morning. Ordered A1c, urine microalbumin. Recommended referral to nephrology if proteinuria persists.  Encouraged blood pressure goal <130/80 mmHg. DASH diet information provided.  Referral to RN case manager for uncontrolled DM, COPD done today.  Annual diabetic eye exam recommended.   Recommend updating shingles, COVID-19 vaccine through local pharmacy.  Orders:   HgB A1c   Urine Microalbumin w/creat. ratio   losartan -hydrochlorothiazide  (HYZAAR ) 50-12.5 MG tablet; Take 1 tablet by mouth daily.   AMB Referral VBCI Care Management  Microalbuminuria due to type 2 diabetes mellitus (HCC) Plan per type II DM with complications Orders:   Urine Microalbumin w/creat. ratio   losartan -hydrochlorothiazide  (HYZAAR ) 50-12.5 MG tablet; Take 1 tablet by mouth daily.  Hypertension associated with diabetes (HCC) Plan per type II DM with complications.  Orders:   losartan -hydrochlorothiazide  (HYZAAR ) 50-12.5 MG tablet; Take 1 tablet by mouth daily.  Seborrheic dermatitis Start ketoconazole  shampoo twice a week, f/u in 3 months.  Orders:   ketoconazole  (NIZORAL ) 2 % shampoo; Apply 1 Application topically 2 (two) times a week.   I personally spent a total of 55 minutes in the care of the patient today including preparing to see the patient, performing a medically appropriate exam/evaluation, counseling and educating, placing orders, referring and communicating with other health care professionals, documenting clinical information in the EHR, independently interpreting results, communicating results, and coordinating care.   Return in about 3 months (around 08/22/2024) for chronic follow up, fasting labs 2 days before apt.   Luke Shade, MD

## 2024-05-24 NOTE — Assessment & Plan Note (Signed)
 Start ketoconazole  shampoo twice a week, f/u in 3 months.  Orders:   ketoconazole  (NIZORAL ) 2 % shampoo; Apply 1 Application topically 2 (two) times a week.

## 2024-05-24 NOTE — Assessment & Plan Note (Addendum)
 Type 2 diabetes mellitus with diabetic kidney disease, hyperglycemia, hyperlipidemia and hypertension Diabetic kidney disease with intolerance to Farxiga .  Blood pressure management is crucial to prevent further kidney damage. Goal BP <130/80 mmHg. Has not started Telmisartan  20 mg daily yet due to insurance concerns, d/c Telmisartan .  Start losartan -hydrochlorothiazide  50-12.5 mg 1 tablet daily in the morning. Ordered A1c, urine microalbumin. Recommended referral to nephrology if proteinuria persists.  Encouraged blood pressure goal <130/80 mmHg. DASH diet information provided.  Referral to RN case manager for uncontrolled DM, COPD done today.  Annual diabetic eye exam recommended.  Recommend updating shingles, COVID-19 vaccine through local pharmacy.  Orders:   HgB A1c   Urine Microalbumin w/creat. ratio   losartan -hydrochlorothiazide  (HYZAAR ) 50-12.5 MG tablet; Take 1 tablet by mouth daily.   AMB Referral VBCI Care Management

## 2024-05-24 NOTE — Assessment & Plan Note (Addendum)
 Symptoms of nasal congestion. Prescribed Flonase  nasal spray, 2 puffs in each nostril daily. Orders:   fluticasone  (FLONASE ) 50 MCG/ACT nasal spray; Place 2 sprays into both nostrils daily.

## 2024-05-24 NOTE — Telephone Encounter (Signed)
 Noted

## 2024-05-24 NOTE — Telephone Encounter (Signed)
 Pharmacy Patient Advocate Encounter   Received notification from Physician's Office that prior authorization for Telmisartan  20 mg tablet  is required/requested.   Insurance verification completed.   The patient is insured through [facility]   Per test claim: Per test claim, medication is not covered due to plan/benefit exclusion, PA not submitted at this time

## 2024-05-24 NOTE — Assessment & Plan Note (Addendum)
 Plan per type II DM with complications.  Orders:   losartan -hydrochlorothiazide  (HYZAAR ) 50-12.5 MG tablet; Take 1 tablet by mouth daily.

## 2024-05-25 ENCOUNTER — Other Ambulatory Visit (HOSPITAL_COMMUNITY): Payer: Self-pay

## 2024-05-25 ENCOUNTER — Telehealth: Payer: Self-pay

## 2024-05-25 NOTE — Telephone Encounter (Signed)
 Copied from CRM #[id number]. Topic: General - Other >> May 25, 2024  1:33 PM Kristen Wong wrote: Reason for CRM: Patient called in regarding handicapped paperwork , informed patient of the timeframe and let her know once completed will call to be picked up

## 2024-05-25 NOTE — Telephone Encounter (Signed)
 Spoke with patient to make here aware that her Handicap Placard is ready and will be placed up front for pick up.

## 2024-05-26 ENCOUNTER — Other Ambulatory Visit: Payer: Self-pay

## 2024-05-27 ENCOUNTER — Telehealth: Payer: Self-pay

## 2024-05-27 ENCOUNTER — Other Ambulatory Visit: Payer: Self-pay

## 2024-05-27 NOTE — Progress Notes (Signed)
 Complex Care Management Note  Care Guide Note 05/27/2024 Name: [name] MRN: [id number] DOB: 06-15-1965  Kristen Wong is a 58 y.o. year old female who sees Bair, Kalpana, MD for primary care. I reached out to Erial M Ashline by phone today to offer complex care management services.  [name] was given information about Complex Care Management services today including:   The Complex Care Management services include support from the care team which includes your Nurse Care Manager, Clinical Social Worker, or Pharmacist.  The Complex Care Management team is here to help remove barriers to the health concerns and goals most important to you. Complex Care Management services are voluntary, and the patient may decline or stop services at any time by request to their care team member.   Complex Care Management Consent Status: Patient agreed to services and verbal consent obtained.   Follow up plan:  Telephone appointment with complex care management team member scheduled for:  06/14/24 & 06/21/24.  Encounter Outcome:  Patient Scheduled  [name]  [facility], [facility] VBCI Assistant Direct Dial: [phone]  Fax: [phone]

## 2024-05-28 ENCOUNTER — Other Ambulatory Visit: Payer: Self-pay

## 2024-06-01 ENCOUNTER — Other Ambulatory Visit: Payer: Self-pay

## 2024-06-14 ENCOUNTER — Telehealth: Payer: Self-pay

## 2024-06-14 DIAGNOSIS — M170 Bilateral primary osteoarthritis of knee: Secondary | ICD-10-CM

## 2024-06-14 DIAGNOSIS — J441 Chronic obstructive pulmonary disease with (acute) exacerbation: Secondary | ICD-10-CM

## 2024-06-14 DIAGNOSIS — E1169 Type 2 diabetes mellitus with other specified complication: Secondary | ICD-10-CM

## 2024-06-14 DIAGNOSIS — M1990 Unspecified osteoarthritis, unspecified site: Secondary | ICD-10-CM

## 2024-06-14 DIAGNOSIS — E118 Type 2 diabetes mellitus with unspecified complications: Secondary | ICD-10-CM

## 2024-06-14 DIAGNOSIS — Z59869 Financial insecurity, unspecified: Secondary | ICD-10-CM

## 2024-06-14 NOTE — Patient Instructions (Signed)
 Visit Information  Kristen Wong was given information about Medicaid Managed Care team care coordination services as a part of their [facility] Community Plan Medicaid benefit.   If you would like to schedule transportation through your [facility], please call the following number at least 2 days in advance of your appointment: [phone]   Rides for urgent appointments can also be made after hours by calling Member Services.  Call the Behavioral Health Crisis Line at [phone], at any time, 24 hours a day, 7 days a week. If you are in danger or need immediate medical attention call 911.  Please see education materials related to COPD Falls Diabetes provided by MyChart link.  Care plan and visit instructions communicated with the patient verbally today. Patient agrees to receive a copy in MyChart. Active MyChart status and patient understanding of how to access instructions and care plan via MyChart confirmed with patient.     Telephone follow up appointment with Managed Medicaid care management team member scheduled for: 06/30/2024 at 10:30 am  Kristen Duos, MSN, RN [place]  [facility], [facility] RN Care Manager Direct Dial: [phone] Fax: [phone]'  Following is a copy of your plan of care:   Goals Addressed             This Visit's Progress    VBCI RN Care Plan - COPD       Problems:  Chronic Disease Management support and education needs related to COPD  Goal: Over the next 90 days the Patient will attend all scheduled medical appointments: 90 days as evidenced by chart review        continue to work with RN Care Manager and/or Social Worker to address care management and care coordination needs related to COPD as evidenced by adherence to care management team scheduled appointments     take all medications exactly as prescribed and will call provider for medication related questions as evidenced by chart  review    verbalize basic understanding of COPD disease process and self health management plan as evidenced by taking medications as prescribed, following COPD Action Plan - promptly notifying provider of symptoms and red flags for ED, notifying RNCM when ready for smoking cessation,   Interventions:   COPD Interventions: Advised patient to track and manage COPD triggers Advised patient to self assesses COPD action plan zone and make appointment with provider if in the yellow zone for 48 hours without improvement Assessed social determinant of health barriers Discussed the importance of adequate rest and management of fatigue with COPD Provided education about and advised patient to utilize infection prevention strategies to reduce risk of respiratory infection Provided instruction about proper use of medications used for management of COPD including inhalers Provided patient with basic written and verbal COPD education on self care/management/and exacerbation prevention Screening for signs and symptoms of depression related to chronic disease state  Meet with Pharmacist for COPD disease management and med cost  Patient Self-Care Activities:  limit outdoor activity during cold weather listen for public air quality announcements every day do breathing exercises every day begin a symptom diary develop a rescue plan follow rescue plan if symptoms flare-up keep follow-up appointments: Pulmonology and PCP don't eat or exercise right before bedtime eat healthy/prescribed diet: low salt Diabetic diet get at least 7 to 8 hours of sleep at night use devices that will help like a cane, sock-puller or reacher  Plan:  Telephone follow up appointment with care management  team member scheduled for:  06/30/24 at 10:30 am          VBCI RN Care Plan - DM       Problems:  Chronic Disease Management support and education needs related to DMII  Goal: Over the next 90 days the Patient will attend  all scheduled medical appointments: patient will attend appointments as evidenced by chart review        continue to work with RN Care Manager and/or Social Worker to address care management and care coordination needs related to DMII as evidenced by adherence to care management team scheduled appointments     take all medications exactly as prescribed and will call provider for medication related questions as evidenced by chart review    verbalize basic understanding of DMII disease process and self health management plan as evidenced by tkig medications as ordered, reporting symptoms of low/high BG to provider, meet with Pharmacist for DM management, follow DM diet  Interventions:   Diabetes Interventions: Assessed patient's understanding of A1c goal: <6.5% Provided education to patient about basic DM disease process Reviewed medications with patient and discussed importance of medication adherence Counseled on importance of regular laboratory monitoring as prescribed Discussed plans with patient for ongoing care management follow up and provided patient with direct contact information for care management team Provided patient with written educational materials related to hypo and hyperglycemia and importance of correct treatment Referral made to pharmacy team for assistance with DM Management  Referral made to social work team for assistance with Financial strain Review of patient status, including review of consultants reports, relevant laboratory and other test results, and medications completed Screening for signs and symptoms of depression related to chronic disease state  Assessed social determinant of health barriers Lab Results  Component Value Date   HGBA1C 7.0 (H) 05/24/2024    Patient Self-Care Activities:  Attend all scheduled provider appointments Call pharmacy for medication refills 3-7 days in advance of running out of medications Call provider office for new concerns or  questions  Take medications as prescribed   Work with the social worker to address care coordination needs and will continue to work with the clinical team to address health care and disease management related needs schedule appointment with eye doctor check feet daily for cuts, sores or redness trim toenails straight across drink 6 to 8 glasses of water each day fill half of plate with vegetables limit fast food meals to no more than 1 per week manage portion size switch to low-fat or skim milk switch to sugar-free drinks do heel pump exercise 2 to 3 times each day keep feet up while sitting wash and dry feet carefully every day wear comfortable, cotton socks wear comfortable, well-fitting shoes  Plan:  Telephone follow up appointment with care management team member scheduled for:  06/30/2024 at 10:30 am             Kristen Duos, MSN, RN [place]  [facility], [facility] RN Care Manager Direct Dial: [phone] Fax: [phone]  COPD Action Plan A COPD action plan is a description of what to do when you have a flare (exacerbation) of chronic obstructive pulmonary disease (COPD). Your action plan is a color-coded plan that lists the symptoms that indicate whether your condition is under control and what actions to take. If you have symptoms in the green zone, it means you are doing well that day. If you have symptoms in the yellow zone, it means you  are having a bad day or an exacerbation. If you have symptoms in the red zone, you need urgent medical care. Follow the plan that you and your health care provider developed. Review your plan with your health care provider at each visit. Red zone Symptoms in this zone mean that you should get medical help right away. They include: Feeling very short of breath, even when you are resting. Not being able to do any activities because of poor breathing. Not being able to sleep because of poor  breathing. Fever or shaking chills. Feeling confused or very sleepy. Chest pain. Coughing up blood. If you have any of these symptoms, call emergency services (911 in the [place] or go to the nearest emergency room. Yellow zone Symptoms in this zone mean that your condition may be getting worse. They include: Feeling more short of breath than usual. Having less energy for daily activities than usual. Phlegm or mucus that is thicker than usual. Needing to use your rescue inhaler or nebulizer more often than usual. More ankle swelling than usual. Coughing more than usual. Feeling like you have a chest cold. Trouble sleeping due to COPD symptoms. Decreased appetite. COPD medicines not helping as much as usual. If you experience any yellow symptoms: Keep taking your daily medicines as directed. Use your quick-relief inhaler as told by your health care provider. If you were prescribed steroid medicine to take by mouth (oral medicine), start taking it as told by your health care provider. If you were prescribed an antibiotic medicine, start taking it as told by your health care provider. Do not stop taking the antibiotic even if you start to feel better. Use oxygen as told by your health care provider. Get more rest. Do your pursed-lip breathing exercises. Do not smoke. Avoid any irritants in the air. If your signs and symptoms do not improve after taking these steps, call your health care provider right away. Green zone Symptoms in this zone mean that you are doing well. They include: Being able to do your usual activities and exercise. Having the usual amount of coughing, including the same amount of phlegm or mucus. Being able to sleep well. Having a good appetite. Where to find more information: You can find more information about COPD from: American Lung Association, My COPD Action Plan: www.lung.org COPD Foundation: www.copdfoundation.org National Heart, Lung, & Blood Institute:  popsteam.is Follow these instructions at home: Continue taking your daily medicines as told by your health care provider. Make sure you receive all the immunizations that your health care provider recommends, especially the pneumococcal and influenza vaccines. Wash your hands often with soap and water. Have family members wash their hands too. Regular hand washing can help prevent infections. Follow your usual exercise and diet plan. Avoid irritants in the air, such as smoke. Do not use any products that contain nicotine  or tobacco. These products include cigarettes, chewing tobacco, and vaping devices, such as e-cigarettes. If you need help quitting, ask your health care provider. Summary A COPD action plan tells you what to do when you have a flare (exacerbation) of chronic obstructive pulmonary disease (COPD). Follow each action plan for your symptoms. If you have any symptoms in the red zone, call emergency services (911 in the [place] or go to the nearest emergency room. This information is not intended to replace advice given to you by your health care provider. Make sure you discuss any questions you have with your health care provider. Document Revised: 04/10/2023 Document Reviewed: 04/10/2023  Elsevier Patient Education  2024 Elsevier Inc.   Living with COPD Being diagnosed with chronic obstructive pulmonary disease (COPD) changes your life physically and emotionally. Having COPD can affect your ability to work and do things you enjoy. COPD is not the same for everyone, and it may change over time. Your health care providers can help you come up with the COPD management plan that works best for you. How to manage lifestyle changes Treatment plan Work closely with your health care providers. Follow your COPD management plan. This plan includes: Instructions about activities, exercises, diet, medicines, what to do when COPD flares up, and when to call your health care provider. A  pulmonary rehabilitation program. In pulmonary rehab, you will learn about COPD, do exercises for fitness and breathing, and get support from health care providers and other people who have COPD. Managing emotions and stress Living with a chronic disease means you may also struggle with stressful emotions, such as sadness, fear, and worry. Here are some ways to manage these emotions: Talk to someone about your fear, anxiety, depression, or stress. Learn strategies to avoid or reduce stress and ask for help if you are struggling with depression or anxiety. Consider joining a COPD support group, online or in person.  Adjusting to changes COPD may limit the things you can do, but you can make certain changes to help you cope with the diagnosis. Ask for help when you need it. Getting support from friends, family, and your health care team is an important part of managing the condition. Try to get regular exercise as prescribed by a health care provider or pulmonary rehab team. Exercising can help COPD, even if you are a bit short of breath. Take steps to prevent infection and protect your lungs: Wash your hands often and avoid being in crowds. Stay away from friends and family members who are sick. Check your local air quality each day, and stay out of areas where air pollution is likely. How to recognize changes in your condition Recognizing changes in your COPD COPD is a progressive disease. It is important to let the health care team know if your COPD is getting worse. Your treatment plan may need to change. Watch for: Increased shortness of breath, wheezing, cough, or fatigue. Loss of ability to exercise or perform daily activities, like climbing stairs. More frequent symptom flares. Signs of depression or anxiety. Recognizing stress It is normal to have additional stress when you have COPD. However, prolonged stress and anxiety can make COPD worse and lead to depression. Recognize the warning  signs, which include: Feeling sad or worried more often or most of the time. Having less energy and losing interest in pleasurable activities. Changes in your appetite or sleeping patterns. Being easily angered or irritated. Having unexplained aches and pains, digestive problems, or headaches. Follow these instructions at home: Eating and drinking  Eat foods that are high in fiber, such as fresh fruits and vegetables, whole grains, and beans. Limit foods that are high in fat and processed sugars, such as fried or sweet foods. Follow a balanced diet and maintain a healthy weight. Being overweight or underweight can make COPD worse. You may work with a data processing manager as part of your pulmonary rehab program. Drink enough fluid to keep your urine pale yellow. If you drink alcohol: Limit how much you have to: 0-1 drink a day for women who are not pregnant. 0-2 drinks a day for men. Know how much alcohol is in your drink.  In the [place] one drink equals one 12 oz bottle of beer (355 mL), one 5 oz glass of wine (148 mL), or one 1 oz glass of hard liquor (44 mL). Lifestyle If you smoke, the most important thing that you can do is to stop smoking. Continuing to smoke will cause the disease to progress faster. Do not use any products that contain nicotine  or tobacco. These products include cigarettes, chewing tobacco, and vaping devices, such as e-cigarettes. If you need help quitting, ask your health care provider. Avoid exposure to things that irritate your lungs, such as smoke, chemicals, and fumes. Activity Balance exercise and rest. Take short walks every 1-2 hours. This is important to improve blood flow and breathing. Ask for help if you feel weak or unsteady. Do exercises that include controlled breathing with body movement, such as tai chi. General instructions Take over-the-counter and prescription medicines only as told by your health care provider. Take vitamin and protein supplements as told  by your health care provider or dietitian. Practice good oral hygiene and see your dental care provider regularly. An oral infection can also spread to your lungs. Make sure you receive all the vaccines that your health care provider recommends. Keep all follow-up visits. This is important. Contact a health care provider if you: Are struggling to manage your COPD. Have emotional stress that interferes with your ability to cope with COPD. Get help right away if you: Have thoughts of suicide, death, or hurting yourself or others. If you ever feel like you may hurt yourself or others, or have thoughts about taking your own life, get help right away. Go to your nearest emergency department or: Call your local emergency services (911 in the [place] Call a suicide crisis helpline, such as the National Suicide Prevention Lifeline at (541)193-6815 or 988 in the [place] This is open 24 hours a day in the [place] If youre a Veteran: Call 988 and press 1. This is open 24 hours a day. Text the [employer] at 620-093-3885. Summary Being diagnosed with chronic obstructive pulmonary disease (COPD) changes your life physically and emotionally. Work with your health care providers and follow your COPD management plan. A pulmonary rehabilitation program is an important part of COPD management. Prolonged stress, anxiety, and depression can make COPD worse. Let your health care provider know if emotional stress interferes with your ability to cope with and manage COPD. This information is not intended to replace advice given to you by your health care provider. Make sure you discuss any questions you have with your health care provider. Document Revised: 01/09/2023 Document Reviewed: 06/14/2020 Elsevier Patient Education  2024 [employer]. Fall Prevention in the Home, Adult Falls can cause injuries and can happen to people of all ages. There are many things you can do to make your home safer and to help prevent  falls. What actions can I take to prevent falls? General information Use good lighting in all rooms. Make sure to: Replace any light bulbs that burn out. Turn on the lights in dark areas and use night-lights. Keep items that you use often in easy-to-reach places. Lower the shelves around your home if needed. Move furniture so that there are clear paths around it. Do not use throw rugs or other things on the floor that can make you trip. If any of your floors are uneven, fix them. Add color or contrast paint or tape to clearly mark and help you see: Grab bars or handrails. First and  last steps of staircases. Where the edge of each step is. If you use a ladder or stepladder: Make sure that it is fully opened. Do not climb a closed ladder. Make sure the sides of the ladder are locked in place. Have someone hold the ladder while you use it. Know where your pets are as you move through your home. What can I do in the bathroom?     Keep the floor dry. Clean up any water on the floor right away. Remove soap buildup in the bathtub or shower. Buildup makes bathtubs and showers slippery. Use non-skid mats or decals on the floor of the bathtub or shower. Attach bath mats securely with double-sided, non-slip rug tape. If you need to sit down in the shower, use a non-slip stool. Install grab bars by the toilet and in the bathtub and shower. Do not use towel bars as grab bars. What can I do in the bedroom? Make sure that you have a light by your bed that is easy to reach. Do not use any sheets or blankets on your bed that hang to the floor. Have a firm chair or bench with side arms that you can use for support when you get dressed. What can I do in the kitchen? Clean up any spills right away. If you need to reach something above you, use a step stool with a grab bar. Keep electrical cords out of the way. Do not use floor polish or wax that makes floors slippery. What can I do with my  stairs? Do not leave anything on the stairs. Make sure that you have a light switch at the top and the bottom of the stairs. Make sure that there are handrails on both sides of the stairs. Fix handrails that are broken or loose. Install non-slip stair treads on all your stairs if they do not have carpet. Avoid having throw rugs at the top or bottom of the stairs. Choose a carpet that does not hide the edge of the steps on the stairs. Make sure that the carpet is firmly attached to the stairs. Fix carpet that is loose or worn. What can I do on the outside of my home? Use bright outdoor lighting. Fix the edges of walkways and driveways and fix any cracks. Clear paths of anything that can make you trip, such as tools or rocks. Add color or contrast paint or tape to clearly mark and help you see anything that might make you trip as you walk through a door, such as a raised step or threshold. Trim any bushes or trees on paths to your home. Check to see if handrails are loose or broken and that both sides of all steps have handrails. Install guardrails along the edges of any raised decks and porches. Have leaves, snow, or ice cleared regularly. Use sand, salt, or ice melter on paths if you live where there is ice and snow during the winter. Clean up any spills in your garage right away. This includes grease or oil spills. What other actions can I take? Review your medicines with your doctor. Some medicines can cause dizziness or changes in blood pressure, which increase your risk of falling. Wear shoes that: Have a low heel. Do not wear high heels. Have rubber bottoms and are closed at the toe. Feel good on your feet and fit well. Use tools that help you move around if needed. These include: Canes. Walkers. Scooters. Crutches. Ask your doctor what else you can  do to help prevent falls. This may include seeing a physical therapist to learn to do exercises to move better and get stronger. Where to  find more information Centers for Disease Control and Prevention, STEADI: tonerpromos.no [employer] on Aging: baseringtones.pl National Institute on Aging: baseringtones.pl Contact a doctor if: You are afraid of falling at home. You feel weak, drowsy, or dizzy at home. You fall at home. Get help right away if you: Lose consciousness or have trouble moving after a fall. Have a fall that causes a head injury. These symptoms may be an emergency. Get help right away. Call 911. Do not wait to see if the symptoms will go away. Do not drive yourself to the hospital. This information is not intended to replace advice given to you by your health care provider. Make sure you discuss any questions you have with your health care provider. Document Revised: 01/28/2022 Document Reviewed: 01/28/2022 Elsevier Patient Education  2024 Elsevier Inc.   Type 2 Diabetes Mellitus, Self-Care, Adult When you have type 2 diabetes (type 2 diabetes mellitus), you must make sure your blood sugar (glucose) stays in a healthy range. You can do this with: Nutrition. Exercise. Lifestyle changes. Medicines or insulin , if needed. Support from your doctors and others. What are the risks? Having type 2 diabetes can raise your risk for other long-term (chronic) health problems. You may get medicines to help prevent these problems. How to stay aware of your blood sugar  Check your blood sugar level every day, as often as told. Have your A1C (hemoglobin A1C) level checked two or more times a year. Have it checked more often if told. Your doctor will set personal treatment goals for you. In general, you should have these blood sugar levels: Before meals: 80-130 mg/dL (4.4-7.2 mmol/L). After meals: below 180 mg/dL (10 mmol/L). A1C: less than 7%. How to manage high and low blood sugar Symptoms of high blood sugar High blood sugar is also called hyperglycemia. Know the symptoms of high blood sugar. These may include: More  thirst. Hunger. Feeling very tired. Needing to pee (urinate) more often than normal. Seeing things blurry. Symptoms of low blood sugar Low blood sugar is also called hypoglycemia. This is when blood sugar is at or below 70 mg/dL (3.9 mmol/L). Symptoms may include: Hunger. Feeling worried or nervous (anxious). Feeling sweaty and cold to the touch (clammy). Being dizzy or light-headed. Feeling sleepy. A fast heartbeat. Feeling grouchy (irritable). Tingling or loss of feeling (numbness) around your mouth, lips, or tongue. Restless sleep. Diabetes medicines can cause low blood sugar. You are more at risk: While you exercise. After exercise. During sleep. When you are sick. When you skip meals or do not eat for a long time. Treating low blood sugar If you think you have low blood sugar, eat or drink something sugary right away. Keep 15 grams of a fast-acting carb (carbohydrate) with you all the time. Make sure your family and friends know how to treat you if you cannot treat yourself. Treating very low blood sugar Severe hypoglycemia is when your blood sugar is at or below 54 mg/dL (3 mmol/L). Severe hypoglycemia is an emergency. Get medical help right away. Call your local emergency services (911 in the [place] Do not wait to see if the symptoms will go away. Do not drive yourself to the hospital. You may need a glucagon shot if you have very low blood sugar and you cannot eat or drink. Have a family member or friend learn  how to check your blood sugar and how to give you a glucagon shot. Ask your doctor if you should have a kit for glucagon shots. Follow these instructions at home: Medicines Take prescribed insulin  or diabetes medicines as told by your health care provider. Do not run out of insulin  or other medicines. Plan ahead. If you use insulin , change the amount you take based on how active you are and what foods you eat. Your doctor will tell you how to do this. Take  over-the-counter and prescription medicines only as told by your doctor. Eating and drinking  Eat healthy foods. These include: Low-fat (lean) proteins. Complex carbs, such as whole grains. Fresh fruits and vegetables. Low-fat dairy products. Healthy fats. Meet with a food expert (dietitian) to make an eating plan. Follow instructions from your doctor about what you cannot eat or drink. Drink enough fluid to keep your pee (urine) pale yellow. Keep track of carbs that you eat. Read food labels and learn serving sizes of foods. Follow your sick-day plan when you cannot eat or drink as normal. Make this plan with your doctor so it is ready to use. Activity Exercise as told by your doctor. You may need to: Do stretching and strength exercises two or more times a week. Do 150 minutes or more of exercise each week that makes your heart beat faster and makes you sweat. Spread out your exercise over 3 or more days a week. Do not go more than 2 days in a row without exercise. Talk with your doctor before you start a new exercise. Your doctor may tell you to change: How much insulin  or medicines you take. How much food you eat. Lifestyle Do not smoke or use any products that contain nicotine  or tobacco. If you need help quitting, ask your doctor. If you drink alcohol and your doctor says that it is safe for you: Limit how much you have to: 0-1 drink a day for women who are not pregnant. 0-2 drinks a day for men. Know how much alcohol is in your drink. In the [place] one drink equals one 12 oz bottle of beer (355 mL), one 5 oz glass of wine (148 mL), or one 1 oz glass of hard liquor (44 mL). Learn to deal with stress. If you need help, ask your doctor. Body care  Stay up to date with your shots (immunizations). Have your eyes and feet checked by a doctor as often as told. Check your skin and feet every day. Check for cuts, bruises, redness, blisters, or sores. Brush your teeth and gums two  times a day. Floss one or more times a day. Go to the dentist one or more times every 6 months. Stay at a healthy weight. General instructions Share your diabetes care plan with: Your work or school. People you live with. Carry a card or wear jewelry that says you have diabetes. Keep all follow-up visits. Questions to ask your doctor Do I need to meet with a certified expert in diabetes education and care? Where can I find a support group? Where to find more information For help and guidance and more information about diabetes, please go to: American Diabetes Association: www.diabetes.org American Association of Diabetes Care and Education Specialists: www.diabeteseducator.org International Diabetes Federation: dconly.dk Summary When you have type 2 diabetes, you must make sure your blood sugar (glucose) stays in a healthy range. You can do this with nutrition, exercise, medicines and insulin , and support from doctors and others. Check your  blood sugar every day, or as often as told. Having diabetes can raise your risk for other long-term health problems. You may get medicines to help prevent these problems. Share your diabetes management plan with people at work, school, and home. Keep all follow-up visits. This information is not intended to replace advice given to you by your health care provider. Make sure you discuss any questions you have with your health care provider. Document Revised: 08/21/2020 Document Reviewed: 08/21/2020 Elsevier Patient Education  2024 [employer].

## 2024-06-14 NOTE — Patient Outreach (Signed)
 Complex Care Management   Visit Note  06/14/2024  Name:  DAESHA INSCO MRN: [id number] DOB: February 21, 1966  Situation: Referral received for Complex Care Management related to COPD and Diabetes with Complications I obtained verbal consent from Patient.  Visit completed with Patient  on the phone  Background:   Past Medical History:  Diagnosis Date   Acute cholecystitis 12/03/2023   Arthritis of both knees    Atypical chest pain 07/28/2023   Bile duct leak 12/04/2023   Calculus of gallbladder without cholecystitis without obstruction 10/28/2023   COPD (chronic obstructive pulmonary disease) (HCC)    Depression    GERD (gastroesophageal reflux disease)    Heart murmur (asymptomatic)    Hepatic steatosis    Hypertension    Irritable bowel syndrome    Polycythemia    T2DM (type 2 diabetes mellitus) (HCC) 2018   Vitamin B 12 deficiency 07/13/2023    Assessment: Patient Reported Symptoms:  Cognitive Cognitive Status: Difficulties with attention and concentration, Alert and oriented to person, place, and time, Insightful and able to interpret abstract concepts, Normal speech and language skills (reports memory issues but thinks might be related ot hearing) Cognitive/Intellectual Conditions Management [RPT]: None reported or documented in medical history or problem list   Health Maintenance Behaviors: Annual physical exam, Immunizations, Social activities Healing Pattern: Slow Health Facilitated by: Pain control, Rest  Neurological Neurological Review of Symptoms: Headaches, Weakness Neurological Management Strategies: Routine screening, Medication therapy Neurological Comment: occasional sinus headaches - OTC meds, weakness in hands from arthritis  HEENT HEENT Symptoms Reported: No symptoms reported HEENT Comment: will call for eye appointment    Cardiovascular Cardiovascular Symptoms Reported: No symptoms reported Cardiovascular Management Strategies: Routine screening, Medication  therapy Cardiovascular Comment: hx tachycardia  Respiratory Respiratory Symptoms Reported: Wheezing, Shortness of breath, Productive cough Other Respiratory Symptoms: productive cough yellow/green mucous unsure how long improved, DOE improved with rest, prn inhaler not used every day, occasionally takes mucinex, COPD Action Plan discussed and sent Additional Respiratory Details: 05/24/24 order for Sprivia - not taking states insurance not paying RNCM will notify PCP Respiratory Management Strategies: Routine screening, Medication therapy  Endocrine Endocrine Symptoms Reported: No symptoms reported Is patient diabetic?: Yes Is patient checking blood sugars at home?: No Endocrine Comment: A1C 7.0 (down) stopped checking BG a few years ago, referral to [facility] for DM Management, drinks sweet tea /sugar /1/2 splenda, watches carbs no exercise due tp oain knees/back  Gastrointestinal Gastrointestinal Symptoms Reported: Constipation Additional Gastrointestinal Details: some constipation, laxative prn, discussed high fiber and hydration Gastrointestinal Management Strategies: Medication therapy Gastrointestinal Comment: GERD Bile leak    Genitourinary Genitourinary Symptoms Reported: No symptoms reported    Integumentary Integumentary Symptoms Reported: No symptoms reported Skin Management Strategies: Routine screening  Musculoskeletal Additional Musculoskeletal Details: low back pain 3-4/10 most days, knees 8/10, tylenol  and naprosyn , weather aggravates pain, no falls, walker/cane prn Musculoskeletal Management Strategies: Medical device, Medication therapy, Routine screening Musculoskeletal Comment: fall precautions discussed and sending info Falls in the past year?: No Number of falls in past year: 1 or less Was there an injury with Fall?: No Fall Risk Category Calculator: 0 Patient Fall Risk Level: Low Fall Risk Patient at Risk for Falls Due to: Orthopedic patient Fall risk Follow up: Falls  evaluation completed, Education provided, Falls prevention discussed  Psychosocial Psychosocial Symptoms Reported: Anxiety - if selected complete GAD, Depression - if selected complete PHQ 2-9 Additional Psychological Details: counseling in past - loss of parents 2020 & 2022 Behavioral Management Strategies:  Medication therapy, Support system Behavioral Health Comment: meds and daughter help with mood Major Change/Loss/Stressor/Fears (CP): Death of a loved one, Medical condition, self, Resources Behaviors When Feeling Stressed/Fearful: trouble sleeping - tranzadone helps Techniques to Cope with Loss/Stress/Change: Medication Quality of Family Relationships: helpful, involved Do you feel physically threatened by others?: No    06/14/2024    PHQ2-9 Depression Screening   Little interest or pleasure in doing things Several days  Feeling down, depressed, or hopeless Several days  PHQ-2 - Total Score 2  Trouble falling or staying asleep, or sleeping too much Nearly every day  Feeling tired or having little energy Nearly every day  Poor appetite or overeating  Several days (poor appetite some days)  Feeling bad about yourself - or that you are a failure or have let yourself or your family down Several days  Trouble concentrating on things, such as reading the newspaper or watching television Several days  Moving or speaking so slowly that other people could have noticed.  Or the opposite - being so fidgety or restless that you have been moving around a lot more than usual Not at all  Thoughts that you would be better off dead, or hurting yourself in some way Not at all  PHQ2-9 Total Score 11  If you checked off any problems, how difficult have these problems made it for you to do your work, take care of things at home, or get along with other people Not difficult at all  Depression Interventions/Treatment Medication (Sched with LCSW 06/21/24 and referred to [facility])    There were no vitals filed for  this visit.    Medications Reviewed Today     Reviewed by Devra Lands, RN (Registered Nurse) on 06/14/24 at 1129  Med List Status: <None>   Medication Order Taking? Sig Documenting Provider Last Dose Status Informant  acetaminophen  (TYLENOL ) 500 MG tablet [id number] Yes Take 1,000 mg by mouth every 6 (six) hours as needed. [provider]  Active   albuterol  (VENTOLIN  HFA) 108 (90 Base) MCG/ACT inhaler [id number] Yes Inhale 1-2 puffs into the lungs every 6 (six) hours as needed. Hope Merle, MD  Active Self  aspirin EC 81 MG tablet [id number] Yes Take 81 mg by mouth daily. Swallow whole. [provider]  Active Self  atorvastatin  (LIPITOR) 20 MG tablet [id number] Yes Take 1 tablet (20 mg total) by mouth daily. Bair, Kalpana, MD  Active   busPIRone  (BUSPAR ) 7.5 MG tablet [id number] Yes Take 1 tablet (7.5 mg total) by mouth 2 (two) times daily. Bair, Luke, MD  Active   carvedilol  (COREG ) 3.125 MG tablet [id number] Yes Take 1 tablet (3.125 mg total) by mouth 2 (two) times daily with a meal. Bair, Kalpana, MD  Active   cetirizine (ZYRTEC) 10 MG tablet [id number] Yes Take 20 mg by mouth daily. [provider]  Active Self  escitalopram  (LEXAPRO ) 10 MG tablet [id number] Yes Take 1 tablet (10 mg total) by mouth daily. Bair, Kalpana, MD  Active   fluticasone  (FLONASE ) 50 MCG/ACT nasal spray [id number] Yes Place 2 sprays into both nostrils daily. Bair, Luke, MD  Active   glipiZIDE  (GLUCOTROL ) 5 MG tablet [id number] Yes Take 1 tablet (5 mg total) by mouth daily before breakfast. Bair, Luke, MD  Active   ketoconazole  (NIZORAL ) 2 % shampoo [id number] Yes Apply 1 Application topically 2 (two) times a week. Bair, Luke, MD  Active   losartan -hydrochlorothiazide  (HYZAAR ) 50-12.5 MG tablet [id number] Yes Take 1 tablet by mouth daily. Bair,  Kalpana, MD  Active   metFORMIN  (GLUCOPHAGE ) 500 MG tablet [id number] Yes Take 2 tablets (1,000 mg total) by mouth 2 (two) times daily with a  meal. Bair, Kalpana, MD  Active   naproxen  (NAPROSYN ) 500 MG tablet [id number] Yes Take 1 tablet (500 mg total) by mouth 2 (two) times daily as needed. Bair, Luke, MD  Active   omeprazole  (PRILOSEC) 40 MG capsule [id number] Yes Take 1 capsule (40 mg total) by mouth daily. Abbey Luke, MD  Active   Spacer/Aero-Holding [place] DEVI [id number] Yes Use with inhaler Cobb, Katherine V, NP  Active   Tiotropium Bromide  (SPIRIVA  RESPIMAT) 2.5 MCG/ACT AERS [id number]  Inhale 2 puffs into the lungs daily.  Patient not taking: Reported on 06/14/2024   Bair, Kalpana, MD  Active   traZODone  (DESYREL ) 50 MG tablet [id number] Yes Take 1 tablet (50 mg total) by mouth at bedtime as needed for sleep. Abbey Luke, MD  Active             Recommendation:   PCP Follow-up Specialty provider follow-up Pulmonology Continue Current Plan of Care  Follow Up Plan:   Telephone follow-up 2 Aishani Kalis  Nestora Duos, MSN, RN [facility]  [facility], [facility] RN Care Manager Direct Dial: [phone] Fax: [phone]

## 2024-06-15 ENCOUNTER — Other Ambulatory Visit: Payer: Self-pay

## 2024-06-15 MED FILL — Metformin HCl Tab 500 MG: ORAL | 15 days supply | Qty: 60 | Fill #1 | Status: AC

## 2024-06-15 NOTE — Patient Outreach (Signed)
 Social Drivers of Health  Community Resource and Care Coordination Visit Note   06/15/2024  Name: ARIS MOMAN MRN: [id number] DOB:1965/07/06  Situation: Referral received for [facility] needs assessment and assistance related to Food Insecurity  grab bars. I obtained verbal consent from Patient.  Visit completed with Patient on the phone.   Background:      Assessment:   Goals Addressed             This Visit's Progress    BSW Goals       Current SDOH Barriers:  Limited access to food Grab bars   Interventions: Advised patient to visit [facility] DSS to apply for foodstamps. SW sent message to PCP for a prior auth to be sent to insurance for grab bars. SW mailed resources for food and utilities in [place].   Thersia Hoar, HEDWIG, MHA [place]  [facility] Social Worker, Population Health [phone]           Recommendation:   call and/or follow up with food pantries and DSS  for food assistance  Follow Up Plan:   Telephone follow-up 07/06/24 at 11:45  Thersia Hoar, BSW, MHA [place]  [facility] Social Worker, Population Health [phone]

## 2024-06-15 NOTE — Patient Instructions (Signed)
 Visit Information  Ms. Standlee was given information about Medicaid Managed Care team care coordination services as a part of their [facility] Community Plan Medicaid benefit.   If you would like to schedule transportation through your [facility], please call the following number at least 2 days in advance of your appointment: [phone]   Rides for urgent appointments can also be made after hours by calling Member Services.  Call the Behavioral Health Crisis Line at [phone], at any time, 24 hours a day, 7 days a week. If you are in danger or need immediate medical attention call 911.    Social Worker will follow up on 07/06/24 at 11:45am.   Thersia Hoar, BSW, MHA [place]  [facility] Social Worker, Population Health [phone]   Following is a copy of your plan of care:   Goals Addressed             This Visit's Progress    BSW Goals       Current SDOH Barriers:  Limited access to food Grab bars   Interventions: Advised patient to visit [facility] DSS to apply for foodstamps. SW sent message to PCP for a prior auth to be sent to insurance for grab bars. SW mailed resources for food and utilities in [place].   Thersia Hoar, HEDWIG, MHA [place]  [facility] Social Worker, Population Health [phone]

## 2024-06-16 ENCOUNTER — Telehealth: Payer: Self-pay

## 2024-06-16 NOTE — Progress Notes (Signed)
 DME order has been placed for Rollator Walker for patient. Printed and signed by provider.

## 2024-06-16 NOTE — Telephone Encounter (Signed)
 Recommend office visit sooner than 08/2024 just to discuss DME order or wait till her appointment with me in March for face to face.   Thank you,  Luke Shade, MD

## 2024-06-16 NOTE — Addendum Note (Signed)
 Addended by: ANICE BELT on: 06/16/2024 09:14 AM   Modules accepted: Orders

## 2024-06-16 NOTE — Progress Notes (Signed)
 Patient was notified that an DME order has been placed and faxed to [employer] for Rollator. Patient verbalized understanding.

## 2024-06-16 NOTE — Telephone Encounter (Signed)
 Copied from CRM #[id number]. Topic: Clinical - Order For Equipment >> Jun 16, 2024 11:14 AM Thersia BROCKS wrote: Reason for CRM: [facility] [employer]  rollator order , need face to face notes that discuss the need for a rollator   [phone]

## 2024-06-17 ENCOUNTER — Other Ambulatory Visit: Payer: Self-pay

## 2024-06-18 ENCOUNTER — Telehealth: Payer: Self-pay

## 2024-06-18 NOTE — Progress Notes (Signed)
 Complex Care Management Note  Care Guide Note 06/18/2024 Name: LILIANNA CASE MRN: [id number] DOB: 23-Apr-1966  TASHALA CUMBO is a 59 y.o. year old female who sees Bair, Kalpana, MD for primary care. I reached out to Jimia M Rubi by phone today to offer complex care management services.  Ms. Salvador was given information about Complex Care Management services today including:   The Complex Care Management services include support from the care team which includes your Nurse Care Manager, Clinical Social Worker, or Pharmacist.  The Complex Care Management team is here to help remove barriers to the health concerns and goals most important to you. Complex Care Management services are voluntary, and the patient may decline or stop services at any time by request to their care team member.   Complex Care Management Consent Status: Patient agreed to services and verbal consent obtained.   Follow up plan:  Telephone appointment with complex care management team member scheduled for:  06/22/24 at 10:00 a.m.   Encounter Outcome:  Patient Scheduled  [name]  [facility], [facility] VBCI Assistant Direct Dial: [phone]  Fax: [phone]

## 2024-06-21 ENCOUNTER — Other Ambulatory Visit: Payer: Self-pay | Admitting: *Deleted

## 2024-06-21 NOTE — Patient Outreach (Signed)
 Patient referred to this social worker to assist with SDOH needs. It has been confirmed that the VBCI BSW is actively involved with patient providing her with needed resources. RNCM also involved. No further needs identified for LCSW at this time.   Darrin Apodaca, LCSW   [facility], [facility] Licensed Clinical Social Worker  Direct Dial: [phone]

## 2024-06-22 ENCOUNTER — Other Ambulatory Visit: Admitting: Pharmacist

## 2024-06-22 ENCOUNTER — Other Ambulatory Visit: Payer: Self-pay

## 2024-06-22 DIAGNOSIS — J449 Chronic obstructive pulmonary disease, unspecified: Secondary | ICD-10-CM

## 2024-06-22 NOTE — Progress Notes (Signed)
" ° °  06/25/2024 Name: Erandy M Persaud MRN: [id number] DOB: 23-Dec-1965  Subjective  Chief Complaint  Patient presents with   COPD   Medication Access    Care Team: Primary Care Provider: Bair, Kalpana, MD  Reason for visit: ?  Shoshana M Lippe is a 59 y.o. female who presents today for a telephone visit with the pharmacist due to medication access concerns regarding their inhaler therapy. Patient mistakenly presented to clinic and was seen in office. Is accompanied by her daughter.   Pt presenting today to discuss concerns with inhaler access, specifically Spiriva .   Prescription drug coverage: YES Payor: [place] MEDICAID PREPAID HEALTH PLAN / Plan: [place] MEDICAID [employer]ITY / Product Type: *No Product type* / .  HPI: Patient reports doing well today. Notes some prior confusion with her Spiriva  prescription, as she though she never received this. However, within the past ~week, she reports finding the unopened Spiriva  prescription at home and over the past few days has started taking it.   Patient's daughter notes she assisted her mother with inserting the Sprivia cartridge and getting the Spiriva  set up. They do have ongoing questions about the device/priming.   Denies concerns with breathing today while in office though reports using her albuterol  inhaler 3-4 times per week on average. Was unsure if she could use both Spiriva  and Advair.    Assessment and Plan:   1. Medication Access Initial concern for Spiriva  expense, though have confirmed with Medicaid plan and with [facility] pharmacy that her prescription will remain $4 per month which patient feels is affordable.  Spiriva  = preferred on [place] Medicaid. Cost = $4/month, confirmed with pharmacy    2. COPD Uncontrolled per frequent use of albuterol , though has not been on maintinence therapy due to access concerns outlined above. Spiriva  started within the past week.  Counseled on proper use of Spiriva  inhaler including cartridge  insertion and initial priming of the inhaler.  Reviewed expiration 90 days after opening, how to track doses used and administration instructions which were demonstrated on a placebo Sprivia device in clinic.  Reviewed with patient the importance of, and difference between, maintenance daily inhaler therapy and as needed rescue inhaler therapy. Patient teach back without concerns. Patient able to verbalize correct dosing and administration instructions. Denies further questions/concerns.   Future Appointments  Date Time Provider Department Center  06/30/2024 10:30 AM Devra Lands, RN [facility] None  07/06/2024 11:45 AM Delene Rouse J [facility] None  07/13/2024  9:30 AM Isaiah Scrivener, MD [facility] [address]  08/25/2024 10:00 AM Maurine Lukes, PA-C [facility] None  08/30/2024 10:00 AM [facility] [address]  09/01/2024 11:00 AM Abbey Bruckner, MD [facility] [address] DOROTHA Bridgette, PharmD PGY1 Health-System Pharmacy Administration and Leadership Resident [facility]  Patient was seen in conjunction with myself, visit was supervised.  Manuelita FABIENE Kobs, PharmD, BCACP, CPP Clinical Pharmacist Practitioner [facility] Ph: [phone] "

## 2024-06-22 NOTE — Progress Notes (Deleted)
" ° °  06/22/2024 Name: TERESHA HANKS MRN: [id number] DOB: 02-08-1966  Subjective  No chief complaint on file.  Care Team: Primary Care Provider: Bair, Kalpana, MD  Reason for visit: ?  Welma M Mullinix is a 59 y.o. female who presents today for a telephone visit with the pharmacist due to medication access concerns regarding their inhaler therapy. ?   Medication Access: ?  Reports that all medications are not affordable.  Was previously prescribed Breztri  for COPD though noted it was not affordable.  Most recently, PCP prescribed Spiriva  as an alternative option though patient notes insurance did not cover.   ***   Prescription drug coverage: YES Payor: [place] MEDICAID PREPAID HEALTH PLAN / Plan: [place] MEDICAID [employer] COMMUNITY / Product Type: *No Product type* / . Summary of Benefit: ***  Current Patient Assistance: {LZ MAP program list:31540}  Patient lives in a household of {NUMBERS:20191} {LZ MAP income choice:31503} via {Source of Income:[phone]}.  Medicare LIS Eligible: {YES/NO:21197} {LZLISELIG2024:31094}  2025 Poverty Guidelines  Family Size  200% 250%  300%  400%  500%   1  $31,300 $39,125  $46,950  $62,600 $78,250  2  $42,300 $52,875 $63,450  $84,600 $105,750  3  $53,300 $66,625 $79,950 $106,600 $133,250  4  64,300 $80,375 $96,450 $128,600 $160,750  Programs BI Cares Inhalers BI Cares [place]&Me BMS GSK NovoNordisk LillyCares [employer] MedicareD: [employer] cholesterol grant  [place]&Me: 300% FPL LAMA/LABA/ICS    Breztri  (glycopyrrolate /formoterol/budesonide) LAMA/LABA    Bevespi Aerosphere (glycopyrrolate  and formoterol fumarate) SABA-ICS     Airspura (albuterol /budesonide)  BI Cares: 200% FPL LAMA    Spiriva  (tiotropium) LAMA/LABA    Stioloto (tiotropium/olodaterol) SAMA/SABA    Combivent (ipratropium/albuterol )  GSK: 300% FPL (Medicare D patients must reach $600 OOP) LABA/ICS    Breo Ellipta (vilanterol/fluticasone ) LAMA/LABA    Anoro  Ellipta (umeclidinium/vilanterol) ICS    Arnuity Ellipta (fluticasone ) LAMA/LABA/ICS    Trellegy Ellipta (umeclidinium/vilanterol/fluticasone ) LABA (DPI)    Severent Disckus (salmeterol powder)  Assessment and Plan:   1. Medication Access ***Spiriva  not covered? Billing for the brand?   {LZ MAP program list:31540} patient portion of application filled out today and uploaded to Media Tab.  Provider page placed in PCP inbox with instruction to fax to MAP program upon signature and to scan into chart.  Patient forms: {LZ MAP income:31500} Income documentation: {LZ MAP income documents:31501} Patient is not eligible for copay cards due to government insurance. Will collaborate with CPhT team to facilitate MAP assistance for *** CPhT Med Access team cc'd for future tracking/correspondences.  Upon completion of signed forms, will fax full application to program and upload in Media Tab. {LZ MAP Fax:31505}.  Future Appointments  Date Time Provider Department Center  06/22/2024 10:00 AM [facility] PHARMACIST [facility] [address]  06/30/2024 10:30 AM Devra Lands, RN [facility] None  07/06/2024 11:45 AM Delene Rouse J [facility] None  07/13/2024  9:30 AM Isaiah Scrivener, MD [facility] [address]  08/25/2024 10:00 AM Maurine Lukes, PA-C [facility] None  08/30/2024 10:00 AM [facility] LAB [facility] [address]  09/01/2024 11:00 AM Abbey Bruckner, MD [facility] [address] FABIENE Geronimo, PharmD Clinical Pharmacist [facility] [phone]  "

## 2024-06-28 ENCOUNTER — Other Ambulatory Visit: Payer: Self-pay

## 2024-06-30 ENCOUNTER — Other Ambulatory Visit: Payer: Self-pay

## 2024-07-01 ENCOUNTER — Other Ambulatory Visit: Payer: Self-pay

## 2024-07-01 DIAGNOSIS — E118 Type 2 diabetes mellitus with unspecified complications: Secondary | ICD-10-CM

## 2024-07-01 DIAGNOSIS — E1129 Type 2 diabetes mellitus with other diabetic kidney complication: Secondary | ICD-10-CM

## 2024-07-01 DIAGNOSIS — I152 Hypertension secondary to endocrine disorders: Secondary | ICD-10-CM

## 2024-07-01 MED ORDER — METFORMIN HCL 500 MG PO TABS
1000.0000 mg | ORAL_TABLET | Freq: Two times a day (BID) | ORAL | 3 refills | Status: AC
  Filled 2024-07-01: qty 360, 90d supply, fill #0

## 2024-07-01 MED ORDER — LOSARTAN POTASSIUM-HCTZ 50-12.5 MG PO TABS
1.0000 | ORAL_TABLET | Freq: Every day | ORAL | 3 refills | Status: AC
  Filled 2024-07-01: qty 90, 90d supply, fill #0

## 2024-07-01 MED ORDER — GLIPIZIDE 5 MG PO TABS
5.0000 mg | ORAL_TABLET | Freq: Every day | ORAL | 3 refills | Status: AC
  Filled 2024-07-01: qty 90, 90d supply, fill #0

## 2024-07-01 NOTE — Patient Instructions (Signed)
 Visit Information  Thank you for taking time to visit with me today. Please don't hesitate to contact me if I can be of assistance to you before our next scheduled appointment.  Your next care management appointment is by telephone on 07/28/2024 at 10:30  Telephone follow-up in 1 month  Please call the care guide team at [phone] if you need to cancel, schedule, or reschedule an appointment.   Please call the Suicide and Crisis Lifeline: 988 call the [place]  National Suicide Prevention Lifeline: (570) 356-0123 or TTY: 819-676-1885 TTY (781)807-7017) to talk to a trained counselor call 1-800-273-TALK (toll free, 24 hour hotline) go to [facility] [address], [place] [phone]) call 911 if you are experiencing a Mental Health or Behavioral Health Crisis or need someone to talk to.  Nestora Duos, MSN, RN [facility], [facility] RN Care Manager Direct Dial: [phone] Fax: [phone]'

## 2024-07-01 NOTE — Patient Outreach (Signed)
 Complex Care Management   Visit Note  07/01/2024  Name:  Kristen Wong MRN: [id number] DOB: [birth year]  Situation: Referral received for Complex Care Management related to COPD, Diabetes with Complications, and HTN I obtained verbal consent from Patient.  Visit completed with Patient  on the phone  Background:   Past Medical History:  Diagnosis Date   Acute cholecystitis 12/03/2023   Arthritis of both knees    Atypical chest pain 07/28/2023   Bile duct leak 12/04/2023   Calculus of gallbladder without cholecystitis without obstruction 10/28/2023   COPD (chronic obstructive pulmonary disease) (HCC)    Depression    GERD (gastroesophageal reflux disease)    Heart murmur (asymptomatic)    Hepatic steatosis    Hypertension    Irritable bowel syndrome    Polycythemia    T2DM (type 2 diabetes mellitus) (HCC) 2018   Vitamin B 12 deficiency 07/13/2023    Assessment: Patient Reported Symptoms:  Cognitive Cognitive Status:  (no change)   Health Maintenance Behaviors: Annual physical exam  Neurological Neurological Review of Symptoms: Not assessed    HEENT HEENT Symptoms Reported: Not assessed      Cardiovascular Cardiovascular Symptoms Reported: No symptoms reported Does patient have uncontrolled Hypertension?: No Cardiovascular Management Strategies: Medication therapy, Routine screening  Respiratory Respiratory Symptoms Reported: Shortness of breath, Wheezing Other Respiratory Symptoms: noted wheezing while patient on call, reports no worse than usual and taking rescue inhaler as needed with improvement, aware sx to call provider and red flags for ED Additional Respiratory Details: reports received Spiriva , instructed to take daily as ordered since maintenance inhaler Respiratory Management Strategies: Routine screening, Medication therapy  Endocrine Endocrine Symptoms Reported: No symptoms reported Is patient diabetic?: Yes Is patient checking blood sugars at home?: No     Gastrointestinal Gastrointestinal Symptoms Reported: Not assessed      Genitourinary Genitourinary Symptoms Reported: Not assessed    Integumentary Integumentary Symptoms Reported: Not assessed    Musculoskeletal Musculoskelatal Symptoms Reviewed: Back pain, Difficulty walking, Joint pain, Muscle pain, Weakness Additional Musculoskeletal Details: no recent falss, walker, RNCM messaging PCP oon status of rollator Musculoskeletal Management Strategies: Medical device, Medication therapy, Routine screening Musculoskeletal Comment: received Fall precautions info - no questions Falls in the past year?: No Number of falls in past year: 1 or less Was there an injury with Fall?: No Fall Risk Category Calculator: 0 Patient Fall Risk Level: Low Fall Risk Patient at Risk for Falls Due to: Orthopedic patient Fall risk Follow up: Falls evaluation completed, Education provided  Psychosocial Psychosocial Symptoms Reported: Anxiety - if selected complete GAD, Depression - if selected complete PHQ 2-9 Additional Psychological Details: reports will need refill of trazadone during potential winter storm, conference called pharmacy and able to do partial fill Behavioral Management Strategies: Medication therapy, Support system        07/01/2024    PHQ2-9 Depression Screening   Little interest or pleasure in doing things    Feeling down, depressed, or hopeless    PHQ-2 - Total Score    Trouble falling or staying asleep, or sleeping too much    Feeling tired or having little energy    Poor appetite or overeating     Feeling bad about yourself - or that you are a failure or have let yourself or your family down    Trouble concentrating on things, such as reading the newspaper or watching television    Moving or speaking so slowly that other people could have noticed.  Or the  opposite - being so fidgety or restless that you have been moving around a lot more than usual    Thoughts that you would be  better off dead, or hurting yourself in some way    PHQ2-9 Total Score    If you checked off any problems, how difficult have these problems made it for you to do your work, take care of things at home, or get along with other people    Depression Interventions/Treatment      There were no vitals filed for this visit.    Medications Reviewed Today     Reviewed by Devra Lands, RN (Registered Nurse) on 06/30/24 at 1051  Med List Status: <None>   Medication Order Taking? Sig Documenting Provider Last Dose Status Informant  acetaminophen  (TYLENOL ) 500 MG tablet [id number] Yes Take 1,000 mg by mouth every 6 (six) hours as needed. [provider]  Active   albuterol  (VENTOLIN  HFA) 108 (90 Base) MCG/ACT inhaler [id number] Yes Inhale 1-2 puffs into the lungs every 6 (six) hours as needed. Hope Merle, MD  Active Self  aspirin EC 81 MG tablet [id number] Yes Take 81 mg by mouth daily. Swallow whole. [provider]  Active Self  atorvastatin  (LIPITOR) 20 MG tablet [id number] Yes Take 1 tablet (20 mg total) by mouth daily. Bair, Kalpana, MD  Active   busPIRone  (BUSPAR ) 7.5 MG tablet [id number] Yes Take 1 tablet (7.5 mg total) by mouth 2 (two) times daily. Bair, Luke, MD  Active   carvedilol  (COREG ) 3.125 MG tablet [id number] Yes Take 1 tablet (3.125 mg total) by mouth 2 (two) times daily with a meal. Bair, Kalpana, MD  Active   cetirizine (ZYRTEC) 10 MG tablet [id number] Yes Take 20 mg by mouth daily. [provider]  Active Self  escitalopram  (LEXAPRO ) 10 MG tablet [id number] Yes Take 1 tablet (10 mg total) by mouth daily. Bair, Kalpana, MD  Active   fluticasone  (FLONASE ) 50 MCG/ACT nasal spray [id number] Yes Place 2 sprays into both nostrils daily. Bair, Kalpana, MD  Active   glipiZIDE  (GLUCOTROL ) 5 MG tablet [id number] Yes Take 1 tablet (5 mg total) by mouth daily before breakfast. Bair, Luke, MD  Active   ketoconazole  (NIZORAL ) 2 % shampoo [id number] Yes Apply 1  Application topically 2 (two) times a week. Bair, Luke, MD  Active   losartan -hydrochlorothiazide  (HYZAAR ) 50-12.5 MG tablet [id number] Yes Take 1 tablet by mouth daily. Bair, Luke, MD  Active   metFORMIN  (GLUCOPHAGE ) 500 MG tablet [id number] Yes Take 2 tablets (1,000 mg total) by mouth 2 (two) times daily with a meal. Bair, Kalpana, MD  Active   naproxen  (NAPROSYN ) 500 MG tablet [id number] Yes Take 1 tablet (500 mg total) by mouth 2 (two) times daily as needed. Bair, Luke, MD  Active   omeprazole  (PRILOSEC) 40 MG capsule [id number] Yes Take 1 capsule (40 mg total) by mouth daily. Abbey Luke, MD  Active   Spacer/Aero-Holding Pease DEVI [id number] Yes Use with inhaler Cobb, Katherine V, NP  Active   Tiotropium Bromide  (SPIRIVA  RESPIMAT) 2.5 MCG/ACT AERS [id number] Yes Inhale 2 puffs into the lungs daily. Bair, Kalpana, MD  Active   traZODone  (DESYREL ) 50 MG tablet [id number] Yes Take 1 tablet (50 mg total) by mouth at bedtime as needed for sleep. Abbey Luke, MD  Active             Recommendation:   PCP Follow-up Continue Current Plan of Care  Follow Up Plan:   Telephone follow-up in 1 month  Nestora Duos, MSN, RN [facility], [facility] RN Care Manager Direct Dial: [phone] Fax: [phone]

## 2024-07-06 ENCOUNTER — Other Ambulatory Visit: Payer: Self-pay

## 2024-07-06 NOTE — Patient Outreach (Signed)
 Social Drivers of Health  Community Resource and Care Coordination Visit Note   07/06/2024  Name: Kristen Wong MRN: [id number] DOB:[birth year]  Situation: Referral received for [facility] needs assessment and assistance related to Financial Strain  food , and grab bars. I obtained verbal consent from Patient.  Visit completed with Patient on the phone.   Background:      Assessment:   Goals Addressed             This Visit's Progress    BSW Goals       Current SDOH Barriers:  Limited access to food Grab bars   Interventions: Advised patient to visit [facility] DSS to apply for foodstamps. SW sent message to PCP for a prior auth to be sent to insurance for grab bars. SW mailed resources for food and utilities in [place]. Patient received resources.   Thersia Hoar, HEDWIG, MHA [place]  [facility] Social Worker, Population Health [phone]           Recommendation:   SW will follow up with PCP   Follow Up Plan:   Telephone follow-up 07/19/24 at 11am  Thersia Hoar, BSW, [facility] [place]  [facility] Social Worker, Population Health [phone]

## 2024-07-06 NOTE — Patient Instructions (Signed)
 Visit Information  Ms. Huebert was given information about Medicaid Managed Care team care coordination services as a part of their [facility] Community Plan Medicaid benefit.   If you would like to schedule transportation through your [facility], please call the following number at least 2 days in advance of your appointment: [phone]   Rides for urgent appointments can also be made after hours by calling Member Services.  Call the Behavioral Health Crisis Line at [phone], at any time, 24 hours a day, 7 days a week. If you are in danger or need immediate medical attention call 911.   Social Worker will follow up on 07/19/24.   Thersia Hoar, BSW, MHA [place]  [facility] Social Worker, Population Health [phone]   Following is a copy of your plan of care:   Goals Addressed             This Visit's Progress    BSW Goals       Current SDOH Barriers:  Limited access to food Grab bars   Interventions: Advised patient to visit [facility] DSS to apply for foodstamps. SW sent message to PCP for a prior auth to be sent to insurance for grab bars. SW mailed resources for food and utilities in [place]. Patient received resources.   Thersia Hoar, HEDWIG, MHA [place]  [facility] Social Worker, Population Health [phone]

## 2024-07-09 ENCOUNTER — Other Ambulatory Visit: Payer: Self-pay

## 2024-07-13 ENCOUNTER — Ambulatory Visit: Admitting: Internal Medicine

## 2024-07-14 ENCOUNTER — Other Ambulatory Visit: Payer: Self-pay

## 2024-07-16 ENCOUNTER — Encounter

## 2024-07-19 ENCOUNTER — Telehealth

## 2024-07-28 ENCOUNTER — Telehealth

## 2024-08-05 ENCOUNTER — Ambulatory Visit: Admitting: Internal Medicine

## 2024-08-25 ENCOUNTER — Ambulatory Visit: Admitting: Physician Assistant

## 2024-08-30 ENCOUNTER — Other Ambulatory Visit

## 2024-09-01 ENCOUNTER — Ambulatory Visit
